# Patient Record
Sex: Male | Born: 1945 | Race: White | Hispanic: No | Marital: Single | Smoking: Former smoker
Health system: Southern US, Community
[De-identification: ages and names within clinical notes are randomized; demographics above are authoritative.]

## PROBLEM LIST (undated history)

## (undated) DIAGNOSIS — I10 Essential (primary) hypertension: Secondary | ICD-10-CM

## (undated) DIAGNOSIS — F32A Depression, unspecified: Secondary | ICD-10-CM

## (undated) DIAGNOSIS — M1990 Unspecified osteoarthritis, unspecified site: Secondary | ICD-10-CM

## (undated) DIAGNOSIS — R531 Weakness: Secondary | ICD-10-CM

## (undated) DIAGNOSIS — R109 Unspecified abdominal pain: Secondary | ICD-10-CM

## (undated) DIAGNOSIS — R0600 Dyspnea, unspecified: Secondary | ICD-10-CM

## (undated) DIAGNOSIS — E7800 Pure hypercholesterolemia, unspecified: Secondary | ICD-10-CM

## (undated) DIAGNOSIS — E119 Type 2 diabetes mellitus without complications: Secondary | ICD-10-CM

## (undated) DIAGNOSIS — N189 Chronic kidney disease, unspecified: Secondary | ICD-10-CM

## (undated) DIAGNOSIS — I252 Old myocardial infarction: Secondary | ICD-10-CM

## (undated) DIAGNOSIS — F329 Major depressive disorder, single episode, unspecified: Secondary | ICD-10-CM

## (undated) DIAGNOSIS — I255 Ischemic cardiomyopathy: Secondary | ICD-10-CM

## (undated) DIAGNOSIS — I2510 Atherosclerotic heart disease of native coronary artery without angina pectoris: Secondary | ICD-10-CM

## (undated) DIAGNOSIS — M79606 Pain in leg, unspecified: Secondary | ICD-10-CM

## (undated) HISTORY — DX: Pain in leg, unspecified: M79.606

## (undated) HISTORY — DX: Weakness: R53.1

## (undated) HISTORY — DX: Unspecified abdominal pain: R10.9

## (undated) HISTORY — DX: Ischemic cardiomyopathy: I25.5

## (undated) HISTORY — PX: SHOULDER SURGERY: SHX246

## (undated) HISTORY — DX: Old myocardial infarction: I25.2

---

## 1972-09-25 HISTORY — PX: COSMETIC SURGERY: SHX468

## 2006-11-08 ENCOUNTER — Other Ambulatory Visit: Payer: Self-pay

## 2006-11-08 ENCOUNTER — Inpatient Hospital Stay: Payer: Self-pay | Admitting: Internal Medicine

## 2006-11-12 ENCOUNTER — Ambulatory Visit: Payer: Self-pay | Admitting: Internal Medicine

## 2007-02-14 ENCOUNTER — Ambulatory Visit: Payer: Self-pay | Admitting: Gastroenterology

## 2013-03-17 DIAGNOSIS — C44311 Basal cell carcinoma of skin of nose: Secondary | ICD-10-CM | POA: Insufficient documentation

## 2014-01-15 ENCOUNTER — Ambulatory Visit: Payer: Self-pay | Admitting: Family Medicine

## 2014-06-02 ENCOUNTER — Emergency Department: Payer: Self-pay | Admitting: Emergency Medicine

## 2014-06-17 ENCOUNTER — Ambulatory Visit: Payer: Self-pay | Admitting: Internal Medicine

## 2014-09-14 ENCOUNTER — Ambulatory Visit: Payer: Self-pay | Admitting: Surgery

## 2014-09-14 DIAGNOSIS — M1712 Unilateral primary osteoarthritis, left knee: Secondary | ICD-10-CM | POA: Insufficient documentation

## 2014-09-14 LAB — SYNOVIAL CELL COUNT + DIFF, W/ CRYSTALS
Basophil: 0 %
CRYSTALS, JOINT FLUID: NONE SEEN
Eosinophil: 0 %
Lymphocytes: 28 %
Neutrophils: 72 %
Nucleated Cell Count: 3381 /mm3
OTHER MONONUCLEAR CELLS: 0 %
Other Cells BF: 0 %

## 2014-09-18 LAB — BODY FLUID CULTURE

## 2015-01-08 DIAGNOSIS — Z85828 Personal history of other malignant neoplasm of skin: Secondary | ICD-10-CM | POA: Diagnosis not present

## 2015-01-08 DIAGNOSIS — D0439 Carcinoma in situ of skin of other parts of face: Secondary | ICD-10-CM | POA: Diagnosis not present

## 2015-01-08 DIAGNOSIS — D485 Neoplasm of uncertain behavior of skin: Secondary | ICD-10-CM | POA: Diagnosis not present

## 2015-01-08 DIAGNOSIS — C44519 Basal cell carcinoma of skin of other part of trunk: Secondary | ICD-10-CM | POA: Diagnosis not present

## 2015-01-08 DIAGNOSIS — D045 Carcinoma in situ of skin of trunk: Secondary | ICD-10-CM | POA: Diagnosis not present

## 2015-02-11 DIAGNOSIS — C4441 Basal cell carcinoma of skin of scalp and neck: Secondary | ICD-10-CM | POA: Diagnosis not present

## 2015-02-11 DIAGNOSIS — L905 Scar conditions and fibrosis of skin: Secondary | ICD-10-CM | POA: Diagnosis not present

## 2015-02-25 DIAGNOSIS — D044 Carcinoma in situ of skin of scalp and neck: Secondary | ICD-10-CM | POA: Diagnosis not present

## 2015-06-08 DIAGNOSIS — E119 Type 2 diabetes mellitus without complications: Secondary | ICD-10-CM | POA: Diagnosis not present

## 2015-06-08 DIAGNOSIS — Z0000 Encounter for general adult medical examination without abnormal findings: Secondary | ICD-10-CM | POA: Diagnosis not present

## 2015-06-08 DIAGNOSIS — I10 Essential (primary) hypertension: Secondary | ICD-10-CM | POA: Diagnosis not present

## 2015-06-08 DIAGNOSIS — Z125 Encounter for screening for malignant neoplasm of prostate: Secondary | ICD-10-CM | POA: Diagnosis not present

## 2015-06-08 DIAGNOSIS — E785 Hyperlipidemia, unspecified: Secondary | ICD-10-CM | POA: Diagnosis not present

## 2015-06-16 DIAGNOSIS — M659 Synovitis and tenosynovitis, unspecified: Secondary | ICD-10-CM | POA: Diagnosis not present

## 2015-06-16 DIAGNOSIS — E782 Mixed hyperlipidemia: Secondary | ICD-10-CM | POA: Diagnosis not present

## 2015-06-16 DIAGNOSIS — E119 Type 2 diabetes mellitus without complications: Secondary | ICD-10-CM | POA: Diagnosis not present

## 2015-06-16 DIAGNOSIS — Z0000 Encounter for general adult medical examination without abnormal findings: Secondary | ICD-10-CM | POA: Diagnosis not present

## 2015-06-28 DIAGNOSIS — G8929 Other chronic pain: Secondary | ICD-10-CM | POA: Diagnosis not present

## 2015-06-28 DIAGNOSIS — M19012 Primary osteoarthritis, left shoulder: Secondary | ICD-10-CM | POA: Diagnosis not present

## 2015-06-28 DIAGNOSIS — M25512 Pain in left shoulder: Secondary | ICD-10-CM | POA: Diagnosis not present

## 2015-08-12 DIAGNOSIS — R600 Localized edema: Secondary | ICD-10-CM | POA: Diagnosis not present

## 2015-08-12 DIAGNOSIS — R079 Chest pain, unspecified: Secondary | ICD-10-CM | POA: Diagnosis not present

## 2016-04-10 DIAGNOSIS — H524 Presbyopia: Secondary | ICD-10-CM | POA: Diagnosis not present

## 2016-04-10 DIAGNOSIS — H5210 Myopia, unspecified eye: Secondary | ICD-10-CM | POA: Diagnosis not present

## 2016-04-12 DIAGNOSIS — E119 Type 2 diabetes mellitus without complications: Secondary | ICD-10-CM | POA: Diagnosis not present

## 2016-04-12 DIAGNOSIS — M7022 Olecranon bursitis, left elbow: Secondary | ICD-10-CM | POA: Diagnosis not present

## 2016-04-12 DIAGNOSIS — M19012 Primary osteoarthritis, left shoulder: Secondary | ICD-10-CM | POA: Diagnosis not present

## 2016-04-15 ENCOUNTER — Encounter (HOSPITAL_COMMUNITY)
Disposition: A | Discharge status: Home / Self Care | Payer: Self-pay | Source: Other Acute Inpatient Hospital | Attending: Cardiovascular Disease

## 2016-04-15 ENCOUNTER — Emergency Department: Payer: Commercial Managed Care - HMO

## 2016-04-15 ENCOUNTER — Emergency Department
Admission: EM | Admit: 2016-04-15 | Discharge: 2016-04-15 | Disposition: A | Discharge status: Other Acute Inpatient Hospital | Payer: Commercial Managed Care - HMO | Attending: Emergency Medicine | Admitting: Emergency Medicine

## 2016-04-15 ENCOUNTER — Encounter (HOSPITAL_COMMUNITY): Payer: Self-pay | Admitting: Cardiovascular Disease

## 2016-04-15 ENCOUNTER — Inpatient Hospital Stay (HOSPITAL_COMMUNITY)
Admit: 2016-04-15 | Discharge: 2016-04-19 | DRG: 247 | Disposition: A | Discharge status: Home / Self Care | Payer: Commercial Managed Care - HMO | Source: Other Acute Inpatient Hospital | Attending: Cardiovascular Disease | Admitting: Cardiovascular Disease

## 2016-04-15 ENCOUNTER — Encounter: Payer: Self-pay | Admitting: Emergency Medicine

## 2016-04-15 DIAGNOSIS — I2121 ST elevation (STEMI) myocardial infarction involving left circumflex coronary artery: Secondary | ICD-10-CM

## 2016-04-15 DIAGNOSIS — Y838 Other surgical procedures as the cause of abnormal reaction of the patient, or of later complication, without mention of misadventure at the time of the procedure: Secondary | ICD-10-CM | POA: Diagnosis not present

## 2016-04-15 DIAGNOSIS — M1990 Unspecified osteoarthritis, unspecified site: Secondary | ICD-10-CM | POA: Diagnosis not present

## 2016-04-15 DIAGNOSIS — I25110 Atherosclerotic heart disease of native coronary artery with unstable angina pectoris: Secondary | ICD-10-CM | POA: Diagnosis not present

## 2016-04-15 DIAGNOSIS — F329 Major depressive disorder, single episode, unspecified: Secondary | ICD-10-CM | POA: Diagnosis not present

## 2016-04-15 DIAGNOSIS — Z87891 Personal history of nicotine dependence: Secondary | ICD-10-CM

## 2016-04-15 DIAGNOSIS — Z888 Allergy status to other drugs, medicaments and biological substances status: Secondary | ICD-10-CM

## 2016-04-15 DIAGNOSIS — E119 Type 2 diabetes mellitus without complications: Secondary | ICD-10-CM | POA: Insufficient documentation

## 2016-04-15 DIAGNOSIS — R079 Chest pain, unspecified: Secondary | ICD-10-CM | POA: Diagnosis not present

## 2016-04-15 DIAGNOSIS — E7800 Pure hypercholesterolemia, unspecified: Secondary | ICD-10-CM | POA: Diagnosis present

## 2016-04-15 DIAGNOSIS — Z882 Allergy status to sulfonamides status: Secondary | ICD-10-CM

## 2016-04-15 DIAGNOSIS — S5011XA Contusion of right forearm, initial encounter: Secondary | ICD-10-CM | POA: Diagnosis not present

## 2016-04-15 DIAGNOSIS — I252 Old myocardial infarction: Secondary | ICD-10-CM

## 2016-04-15 DIAGNOSIS — I255 Ischemic cardiomyopathy: Secondary | ICD-10-CM | POA: Diagnosis present

## 2016-04-15 DIAGNOSIS — R0789 Other chest pain: Secondary | ICD-10-CM | POA: Diagnosis present

## 2016-04-15 DIAGNOSIS — I10 Essential (primary) hypertension: Secondary | ICD-10-CM | POA: Diagnosis not present

## 2016-04-15 DIAGNOSIS — I213 ST elevation (STEMI) myocardial infarction of unspecified site: Secondary | ICD-10-CM | POA: Diagnosis not present

## 2016-04-15 DIAGNOSIS — I2119 ST elevation (STEMI) myocardial infarction involving other coronary artery of inferior wall: Secondary | ICD-10-CM | POA: Diagnosis present

## 2016-04-15 DIAGNOSIS — N183 Chronic kidney disease, stage 3 unspecified: Secondary | ICD-10-CM

## 2016-04-15 DIAGNOSIS — E1122 Type 2 diabetes mellitus with diabetic chronic kidney disease: Secondary | ICD-10-CM | POA: Diagnosis present

## 2016-04-15 DIAGNOSIS — S40029A Contusion of unspecified upper arm, initial encounter: Secondary | ICD-10-CM

## 2016-04-15 DIAGNOSIS — I129 Hypertensive chronic kidney disease with stage 1 through stage 4 chronic kidney disease, or unspecified chronic kidney disease: Secondary | ICD-10-CM | POA: Diagnosis not present

## 2016-04-15 DIAGNOSIS — I2510 Atherosclerotic heart disease of native coronary artery without angina pectoris: Secondary | ICD-10-CM | POA: Diagnosis not present

## 2016-04-15 DIAGNOSIS — E876 Hypokalemia: Secondary | ICD-10-CM

## 2016-04-15 DIAGNOSIS — E785 Hyperlipidemia, unspecified: Secondary | ICD-10-CM | POA: Diagnosis present

## 2016-04-15 DIAGNOSIS — I200 Unstable angina: Secondary | ICD-10-CM

## 2016-04-15 DIAGNOSIS — Z955 Presence of coronary angioplasty implant and graft: Secondary | ICD-10-CM

## 2016-04-15 DIAGNOSIS — E1129 Type 2 diabetes mellitus with other diabetic kidney complication: Secondary | ICD-10-CM

## 2016-04-15 DIAGNOSIS — T79A0XA Compartment syndrome, unspecified, initial encounter: Secondary | ICD-10-CM

## 2016-04-15 HISTORY — DX: Major depressive disorder, single episode, unspecified: F32.9

## 2016-04-15 HISTORY — DX: Essential (primary) hypertension: I10

## 2016-04-15 HISTORY — DX: Pure hypercholesterolemia, unspecified: E78.00

## 2016-04-15 HISTORY — DX: Type 2 diabetes mellitus without complications: E11.9

## 2016-04-15 HISTORY — DX: Atherosclerotic heart disease of native coronary artery without angina pectoris: I25.10

## 2016-04-15 HISTORY — DX: Depression, unspecified: F32.A

## 2016-04-15 HISTORY — PX: CARDIAC CATHETERIZATION: SHX172

## 2016-04-15 HISTORY — DX: Unspecified osteoarthritis, unspecified site: M19.90

## 2016-04-15 HISTORY — DX: Old myocardial infarction: I25.2

## 2016-04-15 LAB — CBC
HCT: 39.9 % — ABNORMAL LOW (ref 40.0–52.0)
HEMATOCRIT: 37 % — AB (ref 39.0–52.0)
HEMOGLOBIN: 12.8 g/dL — AB (ref 13.0–17.0)
Hemoglobin: 14.1 g/dL (ref 13.0–18.0)
MCH: 29.4 pg (ref 26.0–34.0)
MCH: 28.8 pg (ref 26.0–34.0)
MCHC: 35.5 g/dL (ref 32.0–36.0)
MCHC: 34.6 g/dL (ref 30.0–36.0)
MCV: 82.8 fL (ref 80.0–100.0)
MCV: 83.1 fL (ref 78.0–100.0)
PLATELETS: 267 10*3/uL (ref 150–440)
Platelets: 245 10*3/uL (ref 150–400)
RBC: 4.82 MIL/uL (ref 4.40–5.90)
RBC: 4.45 MIL/uL (ref 4.22–5.81)
RDW: 14.5 % (ref 11.5–14.5)
RDW: 13.6 % (ref 11.5–15.5)
WBC: 10.3 10*3/uL (ref 3.8–10.6)
WBC: 11 10*3/uL — ABNORMAL HIGH (ref 4.0–10.5)

## 2016-04-15 LAB — COMPREHENSIVE METABOLIC PANEL
ALBUMIN: 4.4 g/dL (ref 3.5–5.0)
ALT: 17 U/L (ref 17–63)
AST: 18 U/L (ref 15–41)
Alkaline Phosphatase: 69 U/L (ref 38–126)
Anion gap: 11 (ref 5–15)
BILIRUBIN TOTAL: 1.3 mg/dL — AB (ref 0.3–1.2)
BUN: 14 mg/dL (ref 6–20)
CHLORIDE: 105 mmol/L (ref 101–111)
CO2: 24 mmol/L (ref 22–32)
CREATININE: 1.6 mg/dL — AB (ref 0.61–1.24)
Calcium: 9.5 mg/dL (ref 8.9–10.3)
GFR calc Af Amer: 49 mL/min — ABNORMAL LOW (ref >60)
GFR calc non Af Amer: 42 mL/min — ABNORMAL LOW (ref >60)
GLUCOSE: 149 mg/dL — AB (ref 65–99)
POTASSIUM: 2.9 mmol/L — AB (ref 3.5–5.1)
Sodium: 140 mmol/L (ref 135–145)
Total Protein: 7.7 g/dL (ref 6.5–8.1)

## 2016-04-15 LAB — DIFFERENTIAL
Basophils Absolute: 0.1 10*3/uL (ref 0–0.1)
Basophils Relative: 1 %
Eosinophils Absolute: 0.3 10*3/uL (ref 0–0.7)
Eosinophils Relative: 3 %
Lymphocytes Relative: 32 %
Lymphs Abs: 3.3 10*3/uL (ref 1.0–3.6)
Monocytes Absolute: 0.8 10*3/uL (ref 0.2–1.0)
Monocytes Relative: 8 %
Neutro Abs: 5.7 10*3/uL (ref 1.4–6.5)
Neutrophils Relative %: 56 %

## 2016-04-15 LAB — LIPID PANEL
CHOL/HDL RATIO: 5.4 ratio
Cholesterol: 222 mg/dL — ABNORMAL HIGH (ref 0–200)
HDL: 41 mg/dL (ref >40)
LDL CALC: 146 mg/dL — AB (ref 0–99)
Triglycerides: 177 mg/dL — ABNORMAL HIGH (ref <150)
VLDL: 35 mg/dL (ref 0–40)

## 2016-04-15 LAB — TROPONIN I
Troponin I: 0.03 ng/mL (ref <0.03)
Troponin I: 4.14 ng/mL (ref <0.03)

## 2016-04-15 LAB — PROTIME-INR
INR: 1.02
Prothrombin Time: 13.6 seconds (ref 11.4–15.0)

## 2016-04-15 LAB — MRSA PCR SCREENING: MRSA by PCR: NEGATIVE

## 2016-04-15 LAB — GLUCOSE, CAPILLARY: GLUCOSE-CAPILLARY: 119 mg/dL — AB (ref 65–99)

## 2016-04-15 LAB — CREATININE, SERUM
Creatinine, Ser: 1.59 mg/dL — ABNORMAL HIGH (ref 0.61–1.24)
GFR calc Af Amer: 49 mL/min — ABNORMAL LOW (ref >60)
GFR calc non Af Amer: 42 mL/min — ABNORMAL LOW (ref >60)

## 2016-04-15 LAB — TSH: TSH: 1.509 u[IU]/mL (ref 0.350–4.500)

## 2016-04-15 LAB — APTT: aPTT: 27 seconds (ref 24–36)

## 2016-04-15 LAB — BRAIN NATRIURETIC PEPTIDE: B NATRIURETIC PEPTIDE 5: 31.7 pg/mL (ref 0.0–100.0)

## 2016-04-15 SURGERY — LEFT HEART CATH AND CORONARY ANGIOGRAPHY
Anesthesia: LOCAL

## 2016-04-15 MED ORDER — FENTANYL CITRATE (PF) 100 MCG/2ML IJ SOLN
INTRAMUSCULAR | Status: DC | PRN
  Administered 2016-04-15 (×2): 50 ug via INTRAVENOUS
  Administered 2016-04-15: 25 ug via INTRAVENOUS

## 2016-04-15 MED ORDER — HEPARIN BOLUS VIA INFUSION
4000.0000 [IU] | Freq: Once | INTRAVENOUS | Status: DC
  Filled 2016-04-15: qty 4000

## 2016-04-15 MED ORDER — METOPROLOL TARTRATE 12.5 MG HALF TABLET
12.5000 mg | ORAL_TABLET | Freq: Two times a day (BID) | ORAL | Status: DC
  Administered 2016-04-15: 12.5 mg via ORAL
  Filled 2016-04-15 (×2): qty 1

## 2016-04-15 MED ORDER — NITROGLYCERIN 0.4 MG SL SUBL: 0.4000 mg | SUBLINGUAL_TABLET | SUBLINGUAL | Status: DC | PRN

## 2016-04-15 MED ORDER — VERAPAMIL HCL 2.5 MG/ML IV SOLN
INTRAVENOUS | Status: DC | PRN
  Administered 2016-04-15: 10 mL via INTRA_ARTERIAL

## 2016-04-15 MED ORDER — NITROGLYCERIN 0.4 MG SL SUBL
0.4000 mg | SUBLINGUAL_TABLET | SUBLINGUAL | Status: DC | PRN
  Administered 2016-04-15 (×2): 0.4 mg via SUBLINGUAL

## 2016-04-15 MED ORDER — MIDAZOLAM HCL 2 MG/2ML IJ SOLN
INTRAMUSCULAR | Status: DC | PRN
  Administered 2016-04-15 (×2): 2 mg via INTRAVENOUS

## 2016-04-15 MED ORDER — NITROGLYCERIN 1 MG/10 ML FOR IR/CATH LAB
INTRA_ARTERIAL | Status: DC | PRN
  Administered 2016-04-15: 200 ug

## 2016-04-15 MED ORDER — TIROFIBAN HCL IN NACL 5-0.9 MG/100ML-% IV SOLN
INTRAVENOUS | Status: DC | PRN
  Administered 2016-04-15 (×2): 0.075 ug/kg/min via INTRAVENOUS

## 2016-04-15 MED ORDER — SODIUM CHLORIDE 0.9% FLUSH
3.0000 mL | Freq: Two times a day (BID) | INTRAVENOUS | Status: DC
  Administered 2016-04-16: 3 mL via INTRAVENOUS

## 2016-04-15 MED ORDER — HEPARIN SODIUM (PORCINE) 5000 UNIT/ML IJ SOLN
4000.0000 [IU] | Freq: Once | INTRAMUSCULAR | Status: AC
  Administered 2016-04-15: 4000 [IU] via INTRAVENOUS

## 2016-04-15 MED ORDER — SODIUM CHLORIDE 0.9 % WEIGHT BASED INFUSION
3.0000 mL/kg/h | INTRAVENOUS | Status: AC
  Administered 2016-04-15: 3 mL/kg/h via INTRAVENOUS

## 2016-04-15 MED ORDER — HEPARIN (PORCINE) IN NACL 100-0.45 UNIT/ML-% IJ SOLN
1000.0000 [IU]/h | INTRAMUSCULAR | Status: DC
  Filled 2016-04-15: qty 250

## 2016-04-15 MED ORDER — ONDANSETRON HCL 4 MG/2ML IJ SOLN
4.0000 mg | Freq: Four times a day (QID) | INTRAMUSCULAR | Status: DC | PRN
Start: 2016-04-15 — End: 2016-04-19

## 2016-04-15 MED ORDER — INSULIN ASPART 100 UNIT/ML ~~LOC~~ SOLN
0.0000 [IU] | Freq: Three times a day (TID) | SUBCUTANEOUS | Status: DC
  Administered 2016-04-16: 2 [IU] via SUBCUTANEOUS
  Administered 2016-04-16: 5 [IU] via SUBCUTANEOUS
  Administered 2016-04-16: 3 [IU] via SUBCUTANEOUS
  Administered 2016-04-17 – 2016-04-19 (×3): 2 [IU] via SUBCUTANEOUS

## 2016-04-15 MED ORDER — TIROFIBAN HCL IN NACL 5-0.9 MG/100ML-% IV SOLN
INTRAVENOUS | Status: AC
  Filled 2016-04-15: qty 200

## 2016-04-15 MED ORDER — IOPAMIDOL (ISOVUE-370) INJECTION 76%
INTRAVENOUS | Status: DC | PRN
  Administered 2016-04-15: 100 mL via INTRA_ARTERIAL

## 2016-04-15 MED ORDER — VERAPAMIL HCL 2.5 MG/ML IV SOLN
INTRAVENOUS | Status: DC | PRN
  Administered 2016-04-15 (×2): 100 ug via INTRACORONARY
  Administered 2016-04-15: 200 ug via INTRACORONARY

## 2016-04-15 MED ORDER — POTASSIUM CHLORIDE CRYS ER 20 MEQ PO TBCR
40.0000 meq | EXTENDED_RELEASE_TABLET | Freq: Once | ORAL | Status: AC
  Administered 2016-04-15: 40 meq via ORAL
  Filled 2016-04-15: qty 2

## 2016-04-15 MED ORDER — HEPARIN SODIUM (PORCINE) 1000 UNIT/ML IJ SOLN
INTRAMUSCULAR | Status: DC | PRN
  Administered 2016-04-15: 4000 [IU] via INTRAVENOUS
  Administered 2016-04-15: 2000 [IU] via INTRAVENOUS
  Administered 2016-04-15: 4000 [IU] via INTRAVENOUS

## 2016-04-15 MED ORDER — ASPIRIN 81 MG PO CHEW
324.0000 mg | CHEWABLE_TABLET | Freq: Once | ORAL | Status: AC
  Administered 2016-04-15: 324 mg via ORAL

## 2016-04-15 MED ORDER — SODIUM CHLORIDE 0.9 % IV SOLN
INTRAVENOUS | Status: DC | PRN
  Administered 2016-04-15: 100 mL/h via INTRAVENOUS

## 2016-04-15 MED ORDER — OXYCODONE-ACETAMINOPHEN 5-325 MG PO TABS
1.0000 | ORAL_TABLET | ORAL | Status: DC | PRN
  Administered 2016-04-16: 1 via ORAL
  Filled 2016-04-15: qty 1

## 2016-04-15 MED ORDER — BIVALIRUDIN BOLUS VIA INFUSION - CUPID: INTRAVENOUS | Status: DC | PRN

## 2016-04-15 MED ORDER — TIROFIBAN (AGGRASTAT) BOLUS VIA INFUSION
INTRAVENOUS | Status: DC | PRN
  Administered 2016-04-15: 2087.5 ug via INTRAVENOUS

## 2016-04-15 MED ORDER — ATORVASTATIN CALCIUM 80 MG PO TABS
80.0000 mg | ORAL_TABLET | Freq: Every day | ORAL | Status: DC
  Administered 2016-04-15 – 2016-04-18 (×3): 80 mg via ORAL
  Filled 2016-04-15 (×3): qty 1

## 2016-04-15 MED ORDER — TIROFIBAN HCL IV 12.5 MG/250 ML
0.0750 ug/kg/min | INTRAVENOUS | Status: DC
  Filled 2016-04-15: qty 250

## 2016-04-15 MED ORDER — HEPARIN SODIUM (PORCINE) 5000 UNIT/ML IJ SOLN
5000.0000 [IU] | Freq: Three times a day (TID) | INTRAMUSCULAR | Status: DC
  Administered 2016-04-16 – 2016-04-17 (×4): 5000 [IU] via SUBCUTANEOUS
  Filled 2016-04-15 (×4): qty 1

## 2016-04-15 MED ORDER — HEPARIN (PORCINE) IN NACL 2-0.9 UNIT/ML-% IJ SOLN
INTRAMUSCULAR | Status: DC | PRN
  Administered 2016-04-15: 1000 mL

## 2016-04-15 MED ORDER — ACETAMINOPHEN 325 MG PO TABS: 650.0000 mg | ORAL_TABLET | ORAL | Status: DC | PRN

## 2016-04-15 MED ORDER — SODIUM CHLORIDE 0.9% FLUSH: 3.0000 mL | INTRAVENOUS | Status: DC | PRN

## 2016-04-15 MED ORDER — SODIUM CHLORIDE 0.9 % IV SOLN: 250.0000 mL | INTRAVENOUS | Status: DC | PRN

## 2016-04-15 MED ORDER — MORPHINE SULFATE (PF) 2 MG/ML IV SOLN: 2.0000 mg | INTRAVENOUS | Status: DC | PRN

## 2016-04-15 MED ORDER — ASPIRIN EC 81 MG PO TBEC
81.0000 mg | DELAYED_RELEASE_TABLET | Freq: Every day | ORAL | Status: DC
  Administered 2016-04-16 – 2016-04-19 (×4): 81 mg via ORAL
  Filled 2016-04-15 (×5): qty 1

## 2016-04-15 SURGICAL SUPPLY — 19 items
BALLN EMERGE MR 2.5X15 (BALLOONS) ×2
BALLOON EMERGE MR 2.5X15 (BALLOONS) ×1 IMPLANT
CATH INFINITI 5 FR JL3.5 (CATHETERS) ×2 IMPLANT
CATH INFINITI 5FR ANG PIGTAIL (CATHETERS) ×2 IMPLANT
CATH INFINITI JR4 5F (CATHETERS) ×2 IMPLANT
CATH VISTA GUIDE 6FR XB3.5 (CATHETERS) ×2 IMPLANT
CATH VISTA GUIDE 6FR XB4 (CATHETERS) ×2 IMPLANT
DEVICE RAD COMP TR BAND LRG (VASCULAR PRODUCTS) ×2 IMPLANT
ELECT DEFIB PAD ADLT CADENCE (PAD) ×2 IMPLANT
GLIDESHEATH SLEND SS 6F .021 (SHEATH) ×2 IMPLANT
KIT ENCORE 26 ADVANTAGE (KITS) ×2 IMPLANT
KIT HEART LEFT (KITS) ×2 IMPLANT
PACK CARDIAC CATHETERIZATION (CUSTOM PROCEDURE TRAY) ×2 IMPLANT
STENT SYNERGY DES 3.5X20 (Permanent Stent) ×2 IMPLANT
SYR MEDRAD MARK V 150ML (SYRINGE) ×2 IMPLANT
TRANSDUCER W/STOPCOCK (MISCELLANEOUS) ×2 IMPLANT
TUBING CIL FLEX 10 FLL-RA (TUBING) ×2 IMPLANT
WIRE COUGAR XT STRL 190CM (WIRE) ×2 IMPLANT
WIRE SAFE-T 1.5MM-J .035X260CM (WIRE) ×2 IMPLANT

## 2016-04-15 NOTE — ED Provider Notes (Signed)
[facility] Emergency Department Provider Note   ____________________________________________  Time seen: Approximately 5:40 PM I have reviewed the triage vital signs and the triage nursing note.  HISTORY  Chief Complaint Chest Pain   Historian Patient  HPI Gilbert Obrien is a 70 y.o. male with a history of hypertension, without known history of prior MI or coronary artery disease, presents with acute onset 45 minutes prior to arrival of central chest pain and pressure which is moderate to severe. Also associated with some shortness of breath and nausea. No vomiting. No fever. No recent illnesses. Nothing makes it worse or better.    Past Medical History  Diagnosis Date  . Hypertension   . Diabetes mellitus without complication ([place])   . Hypercholesteremia    Hypertension  There are no active problems to display for this patient.   Past Surgical History  Procedure Laterality Date  . Shoulder surgery      No current outpatient prescriptions on file.  Allergies Sulfa antibiotics  History reviewed. No pertinent family history.  Social History Social History  Substance Use Topics  . Smoking status: Former Research scientist (life sciences)  . Smokeless tobacco: None  . Alcohol Use: No    Review of Systems  Constitutional: Negative for fever. Eyes: Negative for visual changes. ENT: Negative for sore throat. Cardiovascular: Positive for chest pain. Respiratory: Positive for shortness of breath. Gastrointestinal: Negative for abdominal pain, vomiting and diarrhea. Genitourinary: Negative for dysuria. Musculoskeletal: Negative for back pain. Skin: Negative for rash. Neurological: Negative for headache. 10 point Review of Systems otherwise negative ____________________________________________   PHYSICAL EXAM:  VITAL SIGNS: ED Triage Vitals  Enc Vitals Group     BP --      Pulse --      Resp --      Temp --      Temp src --      SpO2 --      Weight --       Height --      Head Cir --      Peak Flow --      Pain Score --      Pain Loc --      Pain Edu? --      Excl. in [place]? --      Constitutional: Alert and oriented. Well appearing and in no distress. HEENT   Head: Normocephalic and atraumatic.      Eyes: Conjunctivae are normal. PERRL. Normal extraocular movements.      Ears:         Nose: No congestion/rhinnorhea.   Mouth/Throat: Mucous membranes are moist.   Neck: No stridor. Cardiovascular/Chest: Normal rate, regular rhythm.  No murmurs, rubs, or gallops. Respiratory: Normal respiratory effort without tachypnea nor retractions. Breath sounds are clear and equal bilaterally. No wheezes/rales/rhonchi. Gastrointestinal: Soft. No distention, no guarding, no rebound. Nontender.    Genitourinary/rectal:Deferred Musculoskeletal: Nontender with normal range of motion in all extremities. No joint effusions.  No lower extremity tenderness.  1+ lower extremity pitting edema bilateral lower extremities. Neurologic:  Normal speech and language. No gross or focal neurologic deficits are appreciated. Skin:  Skin is warm, dry and intact. No rash noted. Psychiatric: Mood and affect are normal. Speech and behavior are normal. Patient exhibits appropriate insight and judgment.  ____________________________________________   EKG I, Lisa Roca, MD, the attending physician have personally viewed and interpreted all ECGs.  78 bpm. Normal sinus rhythm. Narrow QRS. 2 small box ST segment elevation in II,  III, and F aVF. ST segment depression in V1 and V2. Some ST segment elevation additionally in V5 and V6 ____________________________________________  LABS (pertinent positives/negatives)  Labs Reviewed  CBC - Abnormal; Notable for the following:    HCT 39.9 (*)    All other components within normal limits  DIFFERENTIAL  PROTIME-INR  APTT  COMPREHENSIVE METABOLIC PANEL  TROPONIN I  LIPID PANEL  HEPARIN LEVEL (UNFRACTIONATED)   CBC    ____________________________________________  RADIOLOGY All Xrays were viewed by me.Interpreted by me   Chest x-ray: No mediastinal widening. No pulmonary infiltrates. __________________________________________  PROCEDURES  Procedure(s) performed: None  Critical Care performed: CRITICAL CARE Performed by: Lisa Roca   Total critical care time: 30 minutes  Critical care time was exclusive of separately billable procedures and treating other patients.  Critical care was necessary to treat or prevent imminent or life-threatening deterioration.  Critical care was time spent personally by me on the following activities: development of treatment plan with patient and/or surrogate as well as nursing, discussions with consultants, evaluation of patient's response to treatment, examination of patient, obtaining history from patient or surrogate, ordering and performing treatments and interventions, ordering and review of laboratory studies, ordering and review of radiographic studies, pulse oximetry and re-evaluation of patient's condition.   ____________________________________________   ED COURSE / ASSESSMENT AND PLAN  Pertinent labs & imaging results that were available during my care of the patient were reviewed by me and considered in my medical decision making (see chart for details).   This patient arrived with acute onset chest pain 45 minutes prior to arrival and in triage his EKG was brought to me and consistent with inferior/lateral STEMI.  Patient was stable vital signs and a clear chest x-ray. Patient was given 4 baby aspirin, and 4000 units heparin bolus in preparation for immediate transport. Patient was given 3 sequential nitroglycerin with significant improvement in pain from 8 down to less than 2.  I spoke with on-call STEMI cardiologist Dr. [name] at [facility] who accepted this patient in transfer. [facility] transport to the patient.  I updated  the patient and the spouse.    CONSULTATIONS:   Cardiology, [facility], accepts in transfer for STEMI.   Patient / Family / Caregiver informed of clinical course, medical decision-making process, and agree with plan.     ___________________________________________   FINAL CLINICAL IMPRESSION(S) / ED DIAGNOSES   Final diagnoses:  ST elevation myocardial infarction (STEMI), unspecified artery ([place])              Note: This dictation was prepared with Dragon dictation. Any transcriptional errors that result from this process are unintentional   Lisa Roca, MD 04/15/16 1806

## 2016-04-15 NOTE — ED Notes (Signed)
Report to aaron RN in cath lab

## 2016-04-15 NOTE — Progress Notes (Signed)
Notified about arm hematoma s/p radial cath/PCI. D/W Dr Rosanna Randy. Pt examined - in no distress, but right forearm has tense hematoma. TR band in appropriate position over the radial access site. Plethysmography signal from right thumb is brisk. Arm wrapped in ACE-bandage to compress hematoma. Will reassess to determine whether he can remain on tirofiban which is necessary to prevent stent thrombosis after PCI earlier tonight.  Sherren Mocha 04/15/2016 10:49 PM

## 2016-04-15 NOTE — ED Notes (Signed)
Report to [facility] at bedside. [facility].

## 2016-04-15 NOTE — ED Notes (Signed)
C/O mid chest pain x 45 minutes.  C/O feeling weak and nauseated with pain.  Denies any history of similar symptoms.

## 2016-04-15 NOTE — ED Notes (Signed)
defib pads placed on pt 
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
## 2016-04-15 NOTE — Progress Notes (Signed)
ANTICOAGULATION CONSULT NOTE - Initial Consult  Pharmacy Consult for Heparin Infusion Indication: ACS/STEMI  Allergies  Allergen Reactions  . Sulfa Antibiotics Nausea And Vomiting    Patient Measurements: Height: 5\' 7"  (170.2 cm) Weight: 184 lb (83.462 kg) IBW/kg (Calculated) : 66.1 Heparin Dosing Weight: 82.9 kg  Vital Signs: Temp: 98.1 F (36.7 C) (07/22 1745) Temp Source: Oral (07/22 1745) BP: 153/73 mmHg (07/22 1745) Pulse Rate: 84 (07/22 1749)  Labs: No results for input(s): HGB, HCT, PLT, APTT, LABPROT, INR, HEPARINUNFRC, HEPRLOWMOCWT, CREATININE, CKTOTAL, CKMB, TROPONINI in the last 72 hours.  CrCl cannot be calculated (Patient has no serum creatinine result on file.).   Medical History: Past Medical History  Diagnosis Date  . Hypertension   . Diabetes mellitus without complication ([place])   . Hypercholesteremia     Medications:  Home med list has not been completed at this time. Reviewed med list from PCP office, no anticoagulants are listed.  Assessment: Patient to be initiated on Heparin infusion for ACS/STEMI.  Goal of Therapy:  Heparin level 0.3-0.7 units/ml Monitor platelets by anticoagulation protocol: Yes   Plan:  Give 4000 units bolus x 1 Start heparin infusion at 1000 units/hr Check anti-Xa level in 6 hours and daily while on heparin Continue to monitor H&H and platelets  Paulina Fusi, PharmD, BCPS 04/15/2016 5:55 PM

## 2016-04-15 NOTE — ED Notes (Signed)
Pt reports onset chest pain 25 min ago. Has had SHOB and diaphoresis

## 2016-04-15 NOTE — Progress Notes (Signed)
   04/15/16 2000  Clinical Encounter Type  Visited With Patient not available  Visit Type ED  Referral From Nurse  Consult/Referral To Chaplain   Chaplain responded to ED for page.  Code Stemi, patient came in and was transported straight to Cath Lab. No family accompanied patient.  Vilinda Blanks [facility] 04/15/2016 8:40 PM

## 2016-04-16 ENCOUNTER — Inpatient Hospital Stay (HOSPITAL_COMMUNITY): Payer: Commercial Managed Care - HMO

## 2016-04-16 ENCOUNTER — Encounter (HOSPITAL_COMMUNITY): Payer: Self-pay

## 2016-04-16 ENCOUNTER — Inpatient Hospital Stay (HOSPITAL_COMMUNITY): Payer: Self-pay

## 2016-04-16 DIAGNOSIS — T79A0XA Compartment syndrome, unspecified, initial encounter: Secondary | ICD-10-CM

## 2016-04-16 LAB — BASIC METABOLIC PANEL WITH GFR
Anion gap: 5 (ref 5–15)
BUN: 9 mg/dL (ref 6–20)
CO2: 25 mmol/L (ref 22–32)
Calcium: 9 mg/dL (ref 8.9–10.3)
Chloride: 108 mmol/L (ref 101–111)
Creatinine, Ser: 1.44 mg/dL — ABNORMAL HIGH (ref 0.61–1.24)
GFR calc Af Amer: 55 mL/min — ABNORMAL LOW
GFR calc non Af Amer: 48 mL/min — ABNORMAL LOW
Glucose, Bld: 150 mg/dL — ABNORMAL HIGH (ref 65–99)
Potassium: 3.8 mmol/L (ref 3.5–5.1)
Sodium: 138 mmol/L (ref 135–145)

## 2016-04-16 LAB — CBC
HCT: 35 % — ABNORMAL LOW (ref 39.0–52.0)
Hemoglobin: 12.1 g/dL — ABNORMAL LOW (ref 13.0–17.0)
MCH: 28.8 pg (ref 26.0–34.0)
MCHC: 34.6 g/dL (ref 30.0–36.0)
MCV: 83.3 fL (ref 78.0–100.0)
PLATELETS: 221 10*3/uL (ref 150–400)
RBC: 4.2 MIL/uL — AB (ref 4.22–5.81)
RDW: 13.6 % (ref 11.5–15.5)
WBC: 10.2 10*3/uL (ref 4.0–10.5)

## 2016-04-16 LAB — TROPONIN I
Troponin I: 22.07 ng/mL
Troponin I: 17.24 ng/mL

## 2016-04-16 LAB — GLUCOSE, CAPILLARY
GLUCOSE-CAPILLARY: 165 mg/dL — AB (ref 65–99)
GLUCOSE-CAPILLARY: 249 mg/dL — AB (ref 65–99)
Glucose-Capillary: 150 mg/dL — ABNORMAL HIGH (ref 65–99)
Glucose-Capillary: 117 mg/dL — ABNORMAL HIGH (ref 65–99)

## 2016-04-16 LAB — LIPID PANEL
Cholesterol: 175 mg/dL (ref 0–200)
HDL: 32 mg/dL — ABNORMAL LOW
LDL Cholesterol: 101 mg/dL — ABNORMAL HIGH (ref 0–99)
Total CHOL/HDL Ratio: 5.5 ratio
Triglycerides: 209 mg/dL — ABNORMAL HIGH
VLDL: 42 mg/dL — ABNORMAL HIGH (ref 0–40)

## 2016-04-16 MED ORDER — TICAGRELOR 90 MG PO TABS
90.0000 mg | ORAL_TABLET | Freq: Two times a day (BID) | ORAL | Status: DC
  Administered 2016-04-16 – 2016-04-19 (×6): 90 mg via ORAL
  Filled 2016-04-16 (×7): qty 1

## 2016-04-16 MED ORDER — TICAGRELOR 90 MG PO TABS
180.0000 mg | ORAL_TABLET | Freq: Once | ORAL | Status: AC
  Administered 2016-04-16: 180 mg via ORAL
  Filled 2016-04-16: qty 2

## 2016-04-16 MED ORDER — METOPROLOL TARTRATE 25 MG PO TABS
25.0000 mg | ORAL_TABLET | Freq: Two times a day (BID) | ORAL | Status: DC
  Administered 2016-04-16 – 2016-04-19 (×6): 25 mg via ORAL
  Filled 2016-04-16 (×6): qty 1

## 2016-04-16 MED ORDER — SODIUM CHLORIDE 0.9 % WEIGHT BASED INFUSION: 1.0000 mL/kg/h | INTRAVENOUS | Status: DC

## 2016-04-16 MED ORDER — SODIUM CHLORIDE 0.9% FLUSH: 3.0000 mL | INTRAVENOUS | Status: DC | PRN

## 2016-04-16 MED ORDER — SODIUM CHLORIDE 0.9 % IV SOLN: 250.0000 mL | INTRAVENOUS | Status: DC | PRN

## 2016-04-16 MED ORDER — SODIUM CHLORIDE 0.9% FLUSH
3.0000 mL | Freq: Two times a day (BID) | INTRAVENOUS | Status: DC
  Administered 2016-04-16 – 2016-04-17 (×2): 3 mL via INTRAVENOUS

## 2016-04-16 MED ORDER — SODIUM CHLORIDE 0.9 % WEIGHT BASED INFUSION
3.0000 mL/kg/h | INTRAVENOUS | Status: DC
  Administered 2016-04-17: 3 mL/kg/h via INTRAVENOUS

## 2016-04-16 NOTE — Progress Notes (Signed)
Utilization review completed.  

## 2016-04-16 NOTE — Progress Notes (Signed)
    Subjective:  The patient denies chest pain. His right arm is feeling better. No shortness of breath or other complaints.  Objective:  Vital Signs in the last 24 hours: Temp:  [97.7 F (36.5 C)-98.4 F (36.9 C)] 98.4 F (36.9 C) (07/23 0800) Pulse Rate:  [46-84] 56 (07/23 0600) Resp:  [11-32] 15 (07/23 0600) BP: (111-153)/(69-86) 131/75 (07/23 0600) SpO2:  [93 %-100 %] 97 % (07/23 0600) Weight:  [80.5 kg (177 lb 7.5 oz)-83.5 kg (184 lb)] 80.5 kg (177 lb 7.5 oz) (07/22 2028)  Intake/Output from previous day: 07/22 0701 - 07/23 0700 In: 1189.9 [P.O.:240; I.V.:949.9] Out: 66 [Urine:975]  Physical Exam: Pt is alert and oriented, NAD HEENT: normal Neck: JVP - normal Lungs: CTA bilaterally CV: RRR without murmur or gallop Abd: soft, NT, Positive BS, no hepatomegaly Ext: There is a right forearm hematoma. Radial pulses 2+. The arm is less tense compared to l;ast night's exam, distal pulses intact and equal Skin: warm/dry no rash   Lab Results:  Recent Labs  04/15/16 2127 04/16/16 0238  WBC 11.0* 10.2  HGB 12.8* 12.1*  PLT 245 221    Recent Labs  04/15/16 1744 04/15/16 2127 04/16/16 0820  NA 140  --  138  K 2.9*  --  3.8  CL 105  --  108  CO2 24  --  25  GLUCOSE 149*  --  150*  BUN 14  --  9  CREATININE 1.60* 1.59* 1.44*    Recent Labs  04/16/16 0238 04/16/16 0820  TROPONINI 22.07* 17.24*    Tele: Personally reviewed: Sinus rhythm  Assessment/Plan:  1. Inferolateral STEMI with severe multivessel CAD: The patient's troponin has peaked at 22. He underwent primary PCI of the left circumflex and has severe residual disease in the proximal LAD and proximal RCA. I have reviewed his cardiac catheterization films and discussed his case with Dr. Cyndia Bent regarding best revascularization option. After discussion of pros and cons of multivessel PCI versus CABG with the patient, recommend proceeding with PCI tomorrow. Will load him with brilinta 180 mg this morning,  discontinue Aggrastat in 2 hours, and plan on PCI of the LAD and right coronary artery tomorrow. I have reviewed the risks, indications, and alternatives to cardiac catheterization, angioplasty, and stenting with the patient. Risks include but are not limited to bleeding, infection, vascular injury, stroke, myocardial infection, arrhythmia, kidney injury, radiation-related injury in the case of prolonged fluoroscopy use, emergency cardiac surgery, and death. The patient understands the risks of serious complication is 1-2 in 123XX123 with diagnostic cardiac cath and 1-2% or less with angioplasty/stenting. Likely will do procedure from left radial or groin approach since he has a right forearm hematoma. All questions answered. Continue beta-blocker, high-intensity statin. No ACE-I until after PCI to reduce risk of contrast nephropathy/AKI.  2. Right forearm hematoma: Likely exacerbated by use of Aggrastat. Improved after compression of the right forearm and stable by exam this morning. Distal neurovascular is intact. Hemoglobin/hematocrit is stable.  3. Type 2 diabetes: Hemoglobin A1c is pending. Sliding scale insulin while hospitalized.  4. Hyperlipidemia: LDL cholesterol 101 mg/dL. High intensity statin initiated.  5. Hypertension: Blood pressure controlled. Continue beta blocker.  Disposition: Plans for PCI tomorrow. Orders written. Anticipate discharge home Tuesday as long as no complications arise.  Sherren Mocha, M.D. 04/16/2016, 10:25 AM

## 2016-04-16 NOTE — Progress Notes (Signed)
Dr. [name] returned to view arm together. Arm softer after ACE wrap application. TR band deflation still in progress. Will re apply ACE wrap in 5 minutes for another hour and then remove for the night. Patient radial pulse now +1, 02 sat in right thumb 98%. Will continue to monitor closely.

## 2016-04-16 NOTE — Progress Notes (Signed)
*  PRELIMINARY RESULTS* Vascular Ultrasound Right Upper Extremity Arterial Duplex has been completed.   The right subclavian, axillary, brachial, radial, ulnar, and palmar arch arteries were visualized and found to be patent with triphasic flow.   04/16/2016 9:34 AM Maudry Mayhew, B.S., RVT, RDCS, RDMS

## 2016-04-17 ENCOUNTER — Inpatient Hospital Stay (HOSPITAL_COMMUNITY): Payer: Commercial Managed Care - HMO

## 2016-04-17 ENCOUNTER — Encounter (HOSPITAL_COMMUNITY)
Disposition: A | Discharge status: Home / Self Care | Payer: Self-pay | Source: Other Acute Inpatient Hospital | Attending: Cardiovascular Disease

## 2016-04-17 ENCOUNTER — Encounter (HOSPITAL_COMMUNITY): Payer: Self-pay | Admitting: Cardiovascular Disease

## 2016-04-17 DIAGNOSIS — R079 Chest pain, unspecified: Secondary | ICD-10-CM

## 2016-04-17 DIAGNOSIS — I200 Unstable angina: Secondary | ICD-10-CM

## 2016-04-17 DIAGNOSIS — I25110 Atherosclerotic heart disease of native coronary artery with unstable angina pectoris: Secondary | ICD-10-CM

## 2016-04-17 HISTORY — PX: CORONARY STENT PLACEMENT: SHX1402

## 2016-04-17 HISTORY — PX: CARDIAC CATHETERIZATION: SHX172

## 2016-04-17 LAB — ECHOCARDIOGRAM COMPLETE
CHL CUP MV DEC (S): 313
E decel time: 313 msec
E/e' ratio: 16.68
FS: 15 % — AB (ref 28–44)
HEIGHTINCHES: 67 in
IV/PV OW: 0.81
LA diam index: 1.72 cm/m2
LA vol A4C: 28.5 ml
LASIZE: 33 mm
LAVOL: 34.3 mL
LAVOLIN: 17.9 mL/m2
LEFT ATRIUM END SYS DIAM: 33 mm
LV E/e'average: 16.68
LV PW d: 12.2 mm — AB (ref 0.6–1.1)
LVEEMED: 16.68
LVELAT: 4.13 cm/s
MVPKAVEL: 118 m/s
MVPKEVEL: 68.9 m/s
RV TAPSE: 22.8 mm
TDI e' lateral: 4.13
TDI e' medial: 4.9
WEIGHTICAEL: 2839.52 [oz_av]

## 2016-04-17 LAB — HEMOGLOBIN A1C
Hgb A1c MFr Bld: 7.1 % — ABNORMAL HIGH (ref 4.8–5.6)
MEAN PLASMA GLUCOSE: 157 mg/dL

## 2016-04-17 LAB — CBC
HEMATOCRIT: 36.2 % — AB (ref 39.0–52.0)
HEMOGLOBIN: 12.7 g/dL — AB (ref 13.0–17.0)
MCH: 28.9 pg (ref 26.0–34.0)
MCHC: 35.1 g/dL (ref 30.0–36.0)
MCV: 82.3 fL (ref 78.0–100.0)
PLATELETS: 206 10*3/uL (ref 150–400)
RBC: 4.4 MIL/uL (ref 4.22–5.81)
RDW: 13.6 % (ref 11.5–15.5)
WBC: 9.6 10*3/uL (ref 4.0–10.5)

## 2016-04-17 LAB — GLUCOSE, CAPILLARY
GLUCOSE-CAPILLARY: 138 mg/dL — AB (ref 65–99)
GLUCOSE-CAPILLARY: 121 mg/dL — AB (ref 65–99)
GLUCOSE-CAPILLARY: 98 mg/dL (ref 65–99)
GLUCOSE-CAPILLARY: 130 mg/dL — AB (ref 65–99)

## 2016-04-17 LAB — BASIC METABOLIC PANEL
Anion gap: 6 (ref 5–15)
BUN: 12 mg/dL (ref 6–20)
CALCIUM: 8.8 mg/dL — AB (ref 8.9–10.3)
CO2: 24 mmol/L (ref 22–32)
Chloride: 110 mmol/L (ref 101–111)
Creatinine, Ser: 1.39 mg/dL — ABNORMAL HIGH (ref 0.61–1.24)
GFR calc Af Amer: 58 mL/min — ABNORMAL LOW (ref >60)
GFR calc non Af Amer: 50 mL/min — ABNORMAL LOW (ref >60)
GLUCOSE: 149 mg/dL — AB (ref 65–99)
Potassium: 3.3 mmol/L — ABNORMAL LOW (ref 3.5–5.1)
Sodium: 140 mmol/L (ref 135–145)

## 2016-04-17 LAB — POCT ACTIVATED CLOTTING TIME
ACTIVATED CLOTTING TIME: 224 s
ACTIVATED CLOTTING TIME: 373 s
Activated Clotting Time: 252 seconds
Activated Clotting Time: 307 seconds

## 2016-04-17 SURGERY — CORONARY STENT INTERVENTION
Anesthesia: LOCAL

## 2016-04-17 MED ORDER — SODIUM CHLORIDE 0.9% FLUSH: 3.0000 mL | INTRAVENOUS | Status: DC | PRN

## 2016-04-17 MED ORDER — SODIUM CHLORIDE 0.9% FLUSH
3.0000 mL | Freq: Two times a day (BID) | INTRAVENOUS | Status: DC
Start: 2016-04-17 — End: 2016-04-18
  Administered 2016-04-17 – 2016-04-18 (×2): 3 mL via INTRAVENOUS

## 2016-04-17 MED ORDER — FENTANYL CITRATE (PF) 100 MCG/2ML IJ SOLN
INTRAMUSCULAR | Status: AC
  Filled 2016-04-17: qty 2

## 2016-04-17 MED ORDER — IOPAMIDOL (ISOVUE-370) INJECTION 76%
INTRAVENOUS | Status: DC | PRN
  Administered 2016-04-17: 90 mL via INTRA_ARTERIAL

## 2016-04-17 MED ORDER — HEART ATTACK BOUNCING BOOK
Freq: Once | Status: AC
  Administered 2016-04-17: 21:00:00
  Filled 2016-04-17: qty 1

## 2016-04-17 MED ORDER — SODIUM CHLORIDE 0.9 % WEIGHT BASED INFUSION: 1.0000 mL/kg/h | INTRAVENOUS | Status: DC

## 2016-04-17 MED ORDER — FENTANYL CITRATE (PF) 100 MCG/2ML IJ SOLN
INTRAMUSCULAR | Status: DC | PRN
  Administered 2016-04-17: 50 ug via INTRAVENOUS

## 2016-04-17 MED ORDER — HEPARIN SODIUM (PORCINE) 1000 UNIT/ML IJ SOLN
INTRAMUSCULAR | Status: DC | PRN
  Administered 2016-04-17: 10000 [IU] via INTRAVENOUS

## 2016-04-17 MED ORDER — IOPAMIDOL (ISOVUE-370) INJECTION 76%
INTRAVENOUS | Status: AC
  Filled 2016-04-17: qty 100

## 2016-04-17 MED ORDER — ANGIOPLASTY BOOK
Freq: Once | Status: AC
  Administered 2016-04-17: 21:00:00
  Filled 2016-04-17: qty 1

## 2016-04-17 MED ORDER — HEPARIN SODIUM (PORCINE) 1000 UNIT/ML IJ SOLN
INTRAMUSCULAR | Status: AC
  Filled 2016-04-17: qty 1

## 2016-04-17 MED ORDER — IOPAMIDOL (ISOVUE-370) INJECTION 76%
INTRAVENOUS | Status: AC
  Filled 2016-04-17: qty 125

## 2016-04-17 MED ORDER — SODIUM CHLORIDE 0.9 % IV SOLN
INTRAVENOUS | Status: AC
  Administered 2016-04-17 (×2): via INTRAVENOUS

## 2016-04-17 MED ORDER — HYDRALAZINE HCL 20 MG/ML IJ SOLN: 10.0000 mg | Freq: Four times a day (QID) | INTRAMUSCULAR | Status: DC | PRN

## 2016-04-17 MED ORDER — SODIUM CHLORIDE 0.9 % IV SOLN: 250.0000 mL | INTRAVENOUS | Status: DC | PRN

## 2016-04-17 MED ORDER — HEPARIN (PORCINE) IN NACL 2-0.9 UNIT/ML-% IJ SOLN
INTRAMUSCULAR | Status: AC
  Filled 2016-04-17: qty 1000

## 2016-04-17 MED ORDER — MIDAZOLAM HCL 2 MG/2ML IJ SOLN
INTRAMUSCULAR | Status: DC | PRN
  Administered 2016-04-17: 2 mg via INTRAVENOUS

## 2016-04-17 MED ORDER — LIDOCAINE HCL (PF) 1 % IJ SOLN
INTRAMUSCULAR | Status: AC
  Filled 2016-04-17: qty 30

## 2016-04-17 MED ORDER — VERAPAMIL HCL 2.5 MG/ML IV SOLN
INTRAVENOUS | Status: AC
  Filled 2016-04-17: qty 2

## 2016-04-17 MED ORDER — POTASSIUM CHLORIDE CRYS ER 20 MEQ PO TBCR
20.0000 meq | EXTENDED_RELEASE_TABLET | Freq: Once | ORAL | Status: AC
  Administered 2016-04-17: 20 meq via ORAL
  Filled 2016-04-17: qty 1

## 2016-04-17 MED ORDER — HEPARIN (PORCINE) IN NACL 2-0.9 UNIT/ML-% IJ SOLN
INTRAMUSCULAR | Status: DC | PRN
  Administered 2016-04-17: 1500 mL

## 2016-04-17 MED ORDER — VERAPAMIL HCL 2.5 MG/ML IV SOLN
INTRAVENOUS | Status: DC | PRN
  Administered 2016-04-17: 10 mL via INTRA_ARTERIAL

## 2016-04-17 MED ORDER — HEPARIN (PORCINE) IN NACL 2-0.9 UNIT/ML-% IJ SOLN
INTRAMUSCULAR | Status: AC
  Filled 2016-04-17: qty 500

## 2016-04-17 MED ORDER — ZOLPIDEM TARTRATE 5 MG PO TABS
5.0000 mg | ORAL_TABLET | Freq: Every evening | ORAL | Status: DC | PRN
  Administered 2016-04-17: 5 mg via ORAL
  Filled 2016-04-17: qty 1

## 2016-04-17 MED ORDER — SODIUM CHLORIDE 0.9 % WEIGHT BASED INFUSION
3.0000 mL/kg/h | INTRAVENOUS | Status: DC
  Administered 2016-04-18: 3 mL/kg/h via INTRAVENOUS

## 2016-04-17 MED ORDER — NITROGLYCERIN 1 MG/10 ML FOR IR/CATH LAB
INTRA_ARTERIAL | Status: AC
  Filled 2016-04-17: qty 10

## 2016-04-17 MED ORDER — MIDAZOLAM HCL 2 MG/2ML IJ SOLN
INTRAMUSCULAR | Status: AC
  Filled 2016-04-17: qty 2

## 2016-04-17 MED ORDER — SODIUM CHLORIDE 0.9% FLUSH: 3.0000 mL | Freq: Two times a day (BID) | INTRAVENOUS | Status: DC

## 2016-04-17 MED ORDER — LIDOCAINE HCL (PF) 1 % IJ SOLN
INTRAMUSCULAR | Status: DC | PRN
  Administered 2016-04-17: 3 mL via INTRADERMAL

## 2016-04-17 SURGICAL SUPPLY — 18 items
BALLN EMERGE MR 2.25X15 (BALLOONS) ×2
BALLN ~~LOC~~ EMERGE MR 3.5X15 (BALLOONS) ×2
BALLOON EMERGE MR 2.25X15 (BALLOONS) ×1 IMPLANT
BALLOON ~~LOC~~ EMERGE MR 3.5X15 (BALLOONS) ×1 IMPLANT
CATH VISTA GUIDE 6FR AL1 (CATHETERS) ×2 IMPLANT
CATH VISTA GUIDE 6FR XBLAD3.5 (CATHETERS) ×2 IMPLANT
DEVICE RAD COMP TR BAND LRG (VASCULAR PRODUCTS) ×2 IMPLANT
GLIDESHEATH SLEND SS 6F .021 (SHEATH) ×2 IMPLANT
GUIDELINER 6F (CATHETERS) ×2 IMPLANT
KIT ENCORE 26 ADVANTAGE (KITS) ×2 IMPLANT
KIT HEART LEFT (KITS) ×2 IMPLANT
PACK CARDIAC CATHETERIZATION (CUSTOM PROCEDURE TRAY) ×2 IMPLANT
STENT SYNERGY DES 3.5X38 (Permanent Stent) ×2 IMPLANT
TRANSDUCER W/STOPCOCK (MISCELLANEOUS) ×2 IMPLANT
TUBING CIL FLEX 10 FLL-RA (TUBING) ×2 IMPLANT
WIRE COUGAR XT STRL 190CM (WIRE) ×2 IMPLANT
WIRE HI TORQ BMW 190CM (WIRE) ×2 IMPLANT
WIRE SAFE-T 1.5MM-J .035X260CM (WIRE) ×2 IMPLANT

## 2016-04-17 NOTE — H&P (View-Only) (Signed)
    Subjective:  The patient denies chest pain. His right arm is feeling better. No shortness of breath or other complaints.  Objective:  Vital Signs in the last 24 hours: Temp:  [97.7 F (36.5 C)-98.4 F (36.9 C)] 98.4 F (36.9 C) (07/23 0800) Pulse Rate:  [46-84] 56 (07/23 0600) Resp:  [11-32] 15 (07/23 0600) BP: (111-153)/(69-86) 131/75 (07/23 0600) SpO2:  [93 %-100 %] 97 % (07/23 0600) Weight:  [80.5 kg (177 lb 7.5 oz)-83.5 kg (184 lb)] 80.5 kg (177 lb 7.5 oz) (07/22 2028)  Intake/Output from previous day: 07/22 0701 - 07/23 0700 In: 1189.9 [P.O.:240; I.V.:949.9] Out: 89 [Urine:975]  Physical Exam: Pt is alert and oriented, NAD HEENT: normal Neck: JVP - normal Lungs: CTA bilaterally CV: RRR without murmur or gallop Abd: soft, NT, Positive BS, no hepatomegaly Ext: There is a right forearm hematoma. Radial pulses 2+. The arm is less tense compared to l;ast night's exam, distal pulses intact and equal Skin: warm/dry no rash   Lab Results:  Recent Labs  04/15/16 2127 04/16/16 0238  WBC 11.0* 10.2  HGB 12.8* 12.1*  PLT 245 221    Recent Labs  04/15/16 1744 04/15/16 2127 04/16/16 0820  NA 140  --  138  K 2.9*  --  3.8  CL 105  --  108  CO2 24  --  25  GLUCOSE 149*  --  150*  BUN 14  --  9  CREATININE 1.60* 1.59* 1.44*    Recent Labs  04/16/16 0238 04/16/16 0820  TROPONINI 22.07* 17.24*    Tele: Personally reviewed: Sinus rhythm  Assessment/Plan:  1. Inferolateral STEMI with severe multivessel CAD: The patient's troponin has peaked at 22. He underwent primary PCI of the left circumflex and has severe residual disease in the proximal LAD and proximal RCA. I have reviewed his cardiac catheterization films and discussed his case with Dr. Cyndia Bent regarding best revascularization option. After discussion of pros and cons of multivessel PCI versus CABG with the patient, recommend proceeding with PCI tomorrow. Will load him with brilinta 180 mg this morning,  discontinue Aggrastat in 2 hours, and plan on PCI of the LAD and right coronary artery tomorrow. I have reviewed the risks, indications, and alternatives to cardiac catheterization, angioplasty, and stenting with the patient. Risks include but are not limited to bleeding, infection, vascular injury, stroke, myocardial infection, arrhythmia, kidney injury, radiation-related injury in the case of prolonged fluoroscopy use, emergency cardiac surgery, and death. The patient understands the risks of serious complication is 1-2 in 123XX123 with diagnostic cardiac cath and 1-2% or less with angioplasty/stenting. Likely will do procedure from left radial or groin approach since he has a right forearm hematoma. All questions answered. Continue beta-blocker, high-intensity statin. No ACE-I until after PCI to reduce risk of contrast nephropathy/AKI.  2. Right forearm hematoma: Likely exacerbated by use of Aggrastat. Improved after compression of the right forearm and stable by exam this morning. Distal neurovascular is intact. Hemoglobin/hematocrit is stable.  3. Type 2 diabetes: Hemoglobin A1c is pending. Sliding scale insulin while hospitalized.  4. Hyperlipidemia: LDL cholesterol 101 mg/dL. High intensity statin initiated.  5. Hypertension: Blood pressure controlled. Continue beta blocker.  Disposition: Plans for PCI tomorrow. Orders written. Anticipate discharge home Tuesday as long as no complications arise.  Sherren Mocha, M.D. 04/16/2016, 10:25 AM

## 2016-04-17 NOTE — Progress Notes (Signed)
   The patient is for PCI today.  Daryel November, MD

## 2016-04-17 NOTE — Progress Notes (Signed)
*  PRELIMINARY RESULTS* Echocardiogram 2D Echocardiogram has been performed.  Gilbert Obrien 04/17/2016, 12:24 PM

## 2016-04-17 NOTE — Interval H&P Note (Signed)
History and Physical Interval Note:  04/17/2016 1:29 PM  Gilbert Obrien  has presented today for cardiac cath/PCI with the diagnosis of CAD, unstable angina, staged PCI of the LAD/RCA.  The various methods of treatment have been discussed with the patient and family. After consideration of risks, benefits and other options for treatment, the patient has consented to  Procedure(s): Coronary Stent Intervention (N/A) as a surgical intervention .  The patient's history has been reviewed, patient examined, no change in status, stable for surgery.  I have reviewed the patient's chart and labs.  Questions were answered to the patient's satisfaction.    Cath Lab Visit (complete for each Cath Lab visit)  Clinical Evaluation Leading to the Procedure:   ACS: Yes.    Non-ACS:    Anginal Classification: CCS IV  Anti-ischemic medical therapy: No Therapy  Non-Invasive Test Results: No non-invasive testing performed  Prior CABG: No previous CABG         Lauree Chandler

## 2016-04-17 NOTE — Care Management Note (Signed)
Case Management Note  Patient Details  Name: SOPHAL SHAKOOR MRN: [id number] Date of Birth: 05/07/1946  Subjective/Objective:        Adm w mi            Action/Plan: lives at home, pcp dr Sabra Heck   Expected Discharge Date:                  Expected Discharge Plan:  Home/Self Care  In-House Referral:     Discharge planning Services  CM Consult, Medication Assistance  Post Acute Care Choice:    Choice offered to:     DME Arranged:    DME Agency:     HH Arranged:    [place] Agency:     Status of Service:     If discussed at [employer], dates discussed:    Additional Comments: gave pt 30day free brilinta card. Pt has [place] ins for meds.  Lacretia Leigh, RN 04/17/2016, 10:53 AM

## 2016-04-17 NOTE — Care Management Important Message (Signed)
Important Message  Patient Details  Name: Gilbert Obrien MRN: [id number] Date of Birth: 1946/02/18   Medicare Important Message Given:  Yes    [name] 04/17/2016, 8:41 AM

## 2016-04-18 ENCOUNTER — Encounter (HOSPITAL_COMMUNITY)
Disposition: A | Discharge status: Home / Self Care | Payer: Self-pay | Source: Other Acute Inpatient Hospital | Attending: Cardiovascular Disease

## 2016-04-18 ENCOUNTER — Encounter (HOSPITAL_COMMUNITY): Payer: Self-pay

## 2016-04-18 HISTORY — PX: CARDIAC CATHETERIZATION: SHX172

## 2016-04-18 LAB — CBC
HCT: 35.3 % — ABNORMAL LOW (ref 39.0–52.0)
Hemoglobin: 12.1 g/dL — ABNORMAL LOW (ref 13.0–17.0)
MCH: 28.4 pg (ref 26.0–34.0)
MCHC: 34.3 g/dL (ref 30.0–36.0)
MCV: 82.9 fL (ref 78.0–100.0)
PLATELETS: 187 10*3/uL (ref 150–400)
RBC: 4.26 MIL/uL (ref 4.22–5.81)
RDW: 13.7 % (ref 11.5–15.5)
WBC: 9.8 10*3/uL (ref 4.0–10.5)

## 2016-04-18 LAB — BASIC METABOLIC PANEL
Anion gap: 6 (ref 5–15)
BUN: 14 mg/dL (ref 6–20)
CALCIUM: 8.5 mg/dL — AB (ref 8.9–10.3)
CO2: 21 mmol/L — ABNORMAL LOW (ref 22–32)
CREATININE: 1.31 mg/dL — AB (ref 0.61–1.24)
Chloride: 111 mmol/L (ref 101–111)
GFR calc non Af Amer: 54 mL/min — ABNORMAL LOW (ref >60)
Glucose, Bld: 123 mg/dL — ABNORMAL HIGH (ref 65–99)
Potassium: 3.3 mmol/L — ABNORMAL LOW (ref 3.5–5.1)
SODIUM: 138 mmol/L (ref 135–145)

## 2016-04-18 LAB — POCT ACTIVATED CLOTTING TIME
ACTIVATED CLOTTING TIME: 197 s
ACTIVATED CLOTTING TIME: 345 s

## 2016-04-18 LAB — GLUCOSE, CAPILLARY
GLUCOSE-CAPILLARY: 126 mg/dL — AB (ref 65–99)
Glucose-Capillary: 143 mg/dL — ABNORMAL HIGH (ref 65–99)
Glucose-Capillary: 82 mg/dL (ref 65–99)
Glucose-Capillary: 162 mg/dL — ABNORMAL HIGH (ref 65–99)

## 2016-04-18 SURGERY — CORONARY STENT INTERVENTION

## 2016-04-18 MED ORDER — MIDAZOLAM HCL 2 MG/2ML IJ SOLN
INTRAMUSCULAR | Status: DC | PRN
  Administered 2016-04-18: 1 mg via INTRAVENOUS
  Administered 2016-04-18: 2 mg via INTRAVENOUS

## 2016-04-18 MED ORDER — FENTANYL CITRATE (PF) 100 MCG/2ML IJ SOLN
INTRAMUSCULAR | Status: DC | PRN
  Administered 2016-04-18: 50 ug via INTRAVENOUS
  Administered 2016-04-18: 25 ug via INTRAVENOUS

## 2016-04-18 MED ORDER — BIVALIRUDIN 250 MG IV SOLR
INTRAVENOUS | Status: AC
  Filled 2016-04-18: qty 250

## 2016-04-18 MED ORDER — IOPAMIDOL (ISOVUE-370) INJECTION 76%
INTRAVENOUS | Status: AC
  Filled 2016-04-18: qty 100

## 2016-04-18 MED ORDER — VIPERSLIDE LUBRICANT OPTIME
TOPICAL | Status: DC | PRN
  Administered 2016-04-18: 20 mL via SURGICAL_CAVITY

## 2016-04-18 MED ORDER — BIVALIRUDIN BOLUS VIA INFUSION - CUPID
INTRAVENOUS | Status: DC | PRN
  Administered 2016-04-18: 59.85 mg via INTRAVENOUS

## 2016-04-18 MED ORDER — SODIUM CHLORIDE 0.9 % IV SOLN
250.0000 mL | INTRAVENOUS | Status: DC | PRN
  Administered 2016-04-18: 1000 mL via INTRAVENOUS

## 2016-04-18 MED ORDER — SODIUM CHLORIDE 0.9% FLUSH
3.0000 mL | Freq: Two times a day (BID) | INTRAVENOUS | Status: DC
  Administered 2016-04-18: 3 mL via INTRAVENOUS

## 2016-04-18 MED ORDER — IOPAMIDOL (ISOVUE-370) INJECTION 76%
INTRAVENOUS | Status: AC
  Filled 2016-04-18: qty 125

## 2016-04-18 MED ORDER — POTASSIUM CHLORIDE CRYS ER 20 MEQ PO TBCR
20.0000 meq | EXTENDED_RELEASE_TABLET | Freq: Once | ORAL | Status: AC
  Administered 2016-04-18: 20 meq via ORAL
  Filled 2016-04-18: qty 1

## 2016-04-18 MED ORDER — LIDOCAINE HCL (PF) 1 % IJ SOLN
INTRAMUSCULAR | Status: AC
Start: 2016-04-18 — End: 2016-04-18
  Filled 2016-04-18: qty 30

## 2016-04-18 MED ORDER — MIDAZOLAM HCL 2 MG/2ML IJ SOLN
INTRAMUSCULAR | Status: AC
  Filled 2016-04-18: qty 2

## 2016-04-18 MED ORDER — SODIUM CHLORIDE 0.9 % IV SOLN: INTRAVENOUS | Status: AC

## 2016-04-18 MED ORDER — FENTANYL CITRATE (PF) 100 MCG/2ML IJ SOLN
INTRAMUSCULAR | Status: AC
  Filled 2016-04-18: qty 2

## 2016-04-18 MED ORDER — SODIUM CHLORIDE 0.9 % IV SOLN
INTRAVENOUS | Status: DC | PRN
  Administered 2016-04-18 (×2): 1.75 mg/kg/h via INTRAVENOUS

## 2016-04-18 MED ORDER — HEPARIN (PORCINE) IN NACL 2-0.9 UNIT/ML-% IJ SOLN
INTRAMUSCULAR | Status: DC | PRN
  Administered 2016-04-18: 1000 mL

## 2016-04-18 MED ORDER — NITROGLYCERIN 1 MG/10 ML FOR IR/CATH LAB
INTRA_ARTERIAL | Status: AC
  Filled 2016-04-18: qty 10

## 2016-04-18 MED ORDER — NITROGLYCERIN 1 MG/10 ML FOR IR/CATH LAB
INTRA_ARTERIAL | Status: DC | PRN
  Administered 2016-04-18: 200 ug via INTRACORONARY

## 2016-04-18 MED ORDER — LIDOCAINE HCL (PF) 1 % IJ SOLN
INTRAMUSCULAR | Status: DC | PRN
  Administered 2016-04-18: 20 mL via INTRADERMAL

## 2016-04-18 MED ORDER — HEPARIN (PORCINE) IN NACL 2-0.9 UNIT/ML-% IJ SOLN
INTRAMUSCULAR | Status: AC
  Filled 2016-04-18: qty 1000

## 2016-04-18 MED ORDER — IOPAMIDOL (ISOVUE-370) INJECTION 76%
INTRAVENOUS | Status: DC | PRN
  Administered 2016-04-18: 145 mL via INTRA_ARTERIAL

## 2016-04-18 MED ORDER — SODIUM CHLORIDE 0.9% FLUSH: 3.0000 mL | INTRAVENOUS | Status: DC | PRN

## 2016-04-18 MED FILL — Nitroglycerin IV Soln 100 MCG/ML in D5W: INTRA_ARTERIAL | Qty: 10 | Status: AC

## 2016-04-18 SURGICAL SUPPLY — 26 items
BALLN TREK RX 2.25X15 (BALLOONS) ×4
BALLN ~~LOC~~ TREK RX 4.0X15 (BALLOONS) ×4
BALLOON TREK RX 2.25X15 (BALLOONS) ×2 IMPLANT
BALLOON ~~LOC~~ TREK RX 4.0X15 (BALLOONS) ×2 IMPLANT
CABLE ADAPT CONN TEMP 6FT (ADAPTER) ×4 IMPLANT
CATH MICRO ASAHI CORSAIR 150CM (MICROCATHETER) ×4 IMPLANT
CATH S G BIP PACING (SET/KITS/TRAYS/PACK) ×4 IMPLANT
CATH VISTA GUIDE 6FR AL1 (CATHETERS) ×4 IMPLANT
CATH VISTA GUIDE 7FR AL 1 (CATHETERS) ×4 IMPLANT
CATH VISTA GUIDE 7FR AL2 (CATHETERS) ×4 IMPLANT
CROWN DIAMONDBACK CLASSIC 1.25 (BURR) ×4 IMPLANT
GUIDE CATH MACH 1 7F AL1 (CATHETERS) ×4 IMPLANT
GUIDE CATH RUNWAY 6FR AL 1 (CATHETERS) ×4 IMPLANT
KIT ENCORE 26 ADVANTAGE (KITS) ×8 IMPLANT
KIT HEART LEFT (KITS) ×4 IMPLANT
PACK CARDIAC CATHETERIZATION (CUSTOM PROCEDURE TRAY) ×4 IMPLANT
PINNACLE LONG 7F 25CM (SHEATH) ×4
SHEATH INTRO PINNACLE 7F 25CM (SHEATH) ×2 IMPLANT
SHEATH PINNACLE 6F 10CM (SHEATH) ×4 IMPLANT
SHEATH PINNACLE 7F 10CM (SHEATH) ×4 IMPLANT
STENT SYNERGY DES 4X24 (Permanent Stent) ×4 IMPLANT
TRANSDUCER W/STOPCOCK (MISCELLANEOUS) ×4 IMPLANT
TUBING CIL FLEX 10 FLL-RA (TUBING) ×4 IMPLANT
WIRE COUGAR XT STRL 300CM (WIRE) ×4 IMPLANT
WIRE EMERALD 3MM-J .035X150CM (WIRE) ×4 IMPLANT
WIRE VIPER ADVANCE COR .012TIP (WIRE) ×8 IMPLANT

## 2016-04-18 NOTE — Interval H&P Note (Signed)
History and Physical Interval Note:  04/18/2016 1:50 PM  Gilbert Obrien  has presented today for cardiac cath with the diagnosis of CAD, unstable angina.   The various methods of treatment have been discussed with the patient and family. After consideration of risks, benefits and other options for treatment, the patient has consented to  Procedure(s): Coronary Stent Intervention (N/A) as a surgical intervention .  The patient's history has been reviewed, patient examined, no change in status, stable for surgery.  I have reviewed the patient's chart and labs.  Questions were answered to the patient's satisfaction.    Cath Lab Visit (complete for each Cath Lab visit)  Clinical Evaluation Leading to the Procedure:   ACS: Yes.    Non-ACS:    Anginal Classification: CCS III  Anti-ischemic medical therapy: No Therapy  Non-Invasive Test Results: No non-invasive testing performed  Prior CABG: No previous CABG         Lauree Chandler

## 2016-04-18 NOTE — Progress Notes (Signed)
CARDIAC REHAB PHASE I   PRE:  Rate/Rhythm: 72 SR  BP:  Sitting: 161/79        SaO2: 100 RA  MODE:  Ambulation: 500 ft   POST:  Rate/Rhythm: 79 SR  BP:  Sitting: 151/78         SaO2: 100 RA  Pt ambulated 500 ft on RA, IV, assist x1, steady gait, tolerated well.  Pt c/o mild DOE, general fatigue, denies cp, dizziness, declined rest stop. Completed MI/stent education.  Reviewed risk factors, MI book, anti-platelet therapy, stent card, activity restrictions, ntg, exercise, heart healthy diet, carb counting, and phase 2 cardiac rehab. Pt verbalized understanding, receptive to education, very emotional throughout conversation, emotional support given to pt. Pt agrees to phase 2 cardiac rehab referral, will send to [facility] per pt request. Pt to recliner after walk, call bell within reach. Will follow-up tomorrow.  [phone] Lenna Sciara, RN, BSN 04/18/2016 11:07 AM

## 2016-04-18 NOTE — Progress Notes (Signed)
    Patient is for atherectomy this afternoon.  Daryel November, MD

## 2016-04-18 NOTE — Progress Notes (Signed)
Site area: right groin  Site Prior to Removal:  Level 0  Pressure Applied For 25 MINUTES    Arterial sheath pulled at 1835; venous sheath pulled at [place] at [place]  Manual:   Yes.    Patient Status During Pull:  Stable   Post Pull Groin Site:  Level 0  Post Pull Instructions Given:  Yes.    Post Pull Pulses Present:  Yes.    Dressing Applied:  Yes.    Comments:  Tolerated well.

## 2016-04-18 NOTE — Progress Notes (Signed)
TR BAND REMOVAL  LOCATION:    left radial  DEFLATED PER PROTOCOL:    Yes.    TIME BAND OFF / DRESSING APPLIED:    2300   SITE UPON ARRIVAL:    Level 0  SITE AFTER BAND REMOVAL:    Level 0  CIRCULATION SENSATION AND MOVEMENT:    Within Normal Limits   Yes.    COMMENTS:   Upon taking over as pts primary RN at 1910 (04/17/16) pt called out states he has bleeding from TR band. TR band checked and inflated per protocol and removed per protocol. Mickel Baas RN and myself assessed the site and patient. Pt stable.

## 2016-04-19 ENCOUNTER — Other Ambulatory Visit: Payer: Self-pay | Admitting: Cardiovascular Disease

## 2016-04-19 ENCOUNTER — Other Ambulatory Visit: Payer: Self-pay | Admitting: Cardiology

## 2016-04-19 ENCOUNTER — Encounter (HOSPITAL_COMMUNITY): Payer: Self-pay | Admitting: Cardiovascular Disease

## 2016-04-19 ENCOUNTER — Telehealth: Payer: Self-pay | Admitting: Cardiovascular Disease

## 2016-04-19 DIAGNOSIS — E119 Type 2 diabetes mellitus without complications: Secondary | ICD-10-CM

## 2016-04-19 DIAGNOSIS — S40029A Contusion of unspecified upper arm, initial encounter: Secondary | ICD-10-CM

## 2016-04-19 DIAGNOSIS — N183 Chronic kidney disease, stage 3 unspecified: Secondary | ICD-10-CM

## 2016-04-19 DIAGNOSIS — I255 Ischemic cardiomyopathy: Secondary | ICD-10-CM

## 2016-04-19 DIAGNOSIS — E876 Hypokalemia: Secondary | ICD-10-CM

## 2016-04-19 DIAGNOSIS — E1129 Type 2 diabetes mellitus with other diabetic kidney complication: Secondary | ICD-10-CM

## 2016-04-19 HISTORY — DX: Ischemic cardiomyopathy: I25.5

## 2016-04-19 HISTORY — DX: Contusion of unspecified upper arm, initial encounter: S40.029A

## 2016-04-19 HISTORY — DX: Hypokalemia: E87.6

## 2016-04-19 LAB — CBC
HEMATOCRIT: 31.7 % — AB (ref 39.0–52.0)
HEMOGLOBIN: 10.7 g/dL — AB (ref 13.0–17.0)
MCH: 28.5 pg (ref 26.0–34.0)
MCHC: 33.8 g/dL (ref 30.0–36.0)
MCV: 84.3 fL (ref 78.0–100.0)
Platelets: 186 10*3/uL (ref 150–400)
RBC: 3.76 MIL/uL — AB (ref 4.22–5.81)
RDW: 14.1 % (ref 11.5–15.5)
WBC: 9.4 10*3/uL (ref 4.0–10.5)

## 2016-04-19 LAB — BASIC METABOLIC PANEL
ANION GAP: 4 — AB (ref 5–15)
BUN: 17 mg/dL (ref 6–20)
CHLORIDE: 113 mmol/L — AB (ref 101–111)
CO2: 21 mmol/L — AB (ref 22–32)
Calcium: 8.4 mg/dL — ABNORMAL LOW (ref 8.9–10.3)
Creatinine, Ser: 1.34 mg/dL — ABNORMAL HIGH (ref 0.61–1.24)
GFR calc non Af Amer: 52 mL/min — ABNORMAL LOW (ref >60)
Glucose, Bld: 191 mg/dL — ABNORMAL HIGH (ref 65–99)
POTASSIUM: 3.3 mmol/L — AB (ref 3.5–5.1)
Sodium: 138 mmol/L (ref 135–145)

## 2016-04-19 LAB — GLUCOSE, CAPILLARY: Glucose-Capillary: 135 mg/dL — ABNORMAL HIGH (ref 65–99)

## 2016-04-19 MED ORDER — RAMIPRIL 2.5 MG PO CAPS: 2.5000 mg | ORAL_CAPSULE | Freq: Every day | ORAL | 0 refills | Status: DC

## 2016-04-19 MED ORDER — TICAGRELOR 90 MG PO TABS: 90.0000 mg | ORAL_TABLET | Freq: Two times a day (BID) | ORAL | 12 refills | Status: DC

## 2016-04-19 MED ORDER — ATORVASTATIN CALCIUM 80 MG PO TABS: 80.0000 mg | ORAL_TABLET | Freq: Every day | ORAL | 0 refills | Status: DC

## 2016-04-19 MED ORDER — RAMIPRIL 2.5 MG PO CAPS: 2.5000 mg | ORAL_CAPSULE | Freq: Every day | ORAL | 12 refills | Status: DC

## 2016-04-19 MED ORDER — RAMIPRIL 2.5 MG PO CAPS
2.5000 mg | ORAL_CAPSULE | Freq: Every day | ORAL | Status: DC
  Administered 2016-04-19: 2.5 mg via ORAL
  Filled 2016-04-19: qty 1

## 2016-04-19 MED ORDER — ATORVASTATIN CALCIUM 80 MG PO TABS: 80.0000 mg | ORAL_TABLET | Freq: Every day | ORAL | 12 refills | Status: DC

## 2016-04-19 MED ORDER — NITROGLYCERIN 0.4 MG SL SUBL: 0.4000 mg | SUBLINGUAL_TABLET | SUBLINGUAL | 1 refills | Status: DC | PRN

## 2016-04-19 MED ORDER — POTASSIUM CHLORIDE CRYS ER 20 MEQ PO TBCR
40.0000 meq | EXTENDED_RELEASE_TABLET | Freq: Once | ORAL | Status: AC
  Administered 2016-04-19: 08:00:00 40 meq via ORAL
  Filled 2016-04-19: qty 2

## 2016-04-19 MED ORDER — AMLODIPINE BESYLATE 10 MG PO TABS: 10.0000 mg | ORAL_TABLET | Freq: Every day | ORAL | 12 refills | Status: DC

## 2016-04-19 MED ORDER — TICAGRELOR 90 MG PO TABS: 90.0000 mg | ORAL_TABLET | Freq: Two times a day (BID) | ORAL | 0 refills | Status: DC

## 2016-04-19 MED ORDER — ASPIRIN 81 MG PO TBEC: 81.0000 mg | DELAYED_RELEASE_TABLET | Freq: Every day | ORAL | Status: DC

## 2016-04-19 MED ORDER — METOPROLOL TARTRATE 25 MG PO TABS: 25.0000 mg | ORAL_TABLET | Freq: Two times a day (BID) | ORAL | 12 refills | Status: DC

## 2016-04-19 MED ORDER — METOPROLOL TARTRATE 25 MG PO TABS: 25.0000 mg | ORAL_TABLET | Freq: Two times a day (BID) | ORAL | 0 refills | Status: DC

## 2016-04-19 NOTE — Telephone Encounter (Signed)
Follow Up:     Pt says he still have not received hi medicine and he needs it today> Please call this in asap Make sure it is for all four of his medicine,Atorvastatin, Metoprolol,Ramipril and Nitroglycerin please.

## 2016-04-19 NOTE — Telephone Encounter (Signed)
New message     *STAT* If patient is at the pharmacy, call can be transferred to refill team.   1. Which medications need to be refilled? (please list name of each medication and dose if known) metoprolol 25mg , nitroglycerin 0.4 mg, ramierie 2.5 mg  2. Which pharmacy/location (including street and city if local pharmacy) is medication to be sent to? [facility] in [place] on [address]   3. Do they need a 30 day or 90 day supply? 90, 90, 90

## 2016-04-19 NOTE — Telephone Encounter (Signed)
F/u   Pt calling back about his prescription going to [facility] on [address] in [place], [place]. Atorvastatin is the medicine.     *STAT* If patient is at the pharmacy, call can be transferred to refill team.   1. Which medications need to be refilled? (please list name of each medication and dose if known) Atorvastatin 80mg   2. Which pharmacy/location (including street and city if local pharmacy) is medication to be sent to? [facility] on [address] in , [place]  3. Do they need a 30 day or 90 day supply? [place]

## 2016-04-19 NOTE — Care Management Note (Signed)
Case Management Note  Patient Details  Name: Gilbert Obrien MRN: [id number] Date of Birth: 1946-04-15  Subjective/Objective:    Patient presents with MI, had another stent placed yesterday, will be on Brilinta , co pay is 47.00 and the [facility] in [place] that patient goes to has in stock.  NCM will cont to follow for dc needs.                 Action/Plan:   Expected Discharge Date:                  Expected Discharge Plan:  Home/Self Care  In-House Referral:     Discharge planning Services  CM Consult, Medication Assistance  Post Acute Care Choice:    Choice offered to:     DME Arranged:    DME Agency:     HH Arranged:    HH Agency:     Status of Service:  Completed, signed off  If discussed at [employer] of [employer] Meetings, dates discussed:    Additional Comments:  Zenon Mayo, RN 04/19/2016, 8:59 AM

## 2016-04-19 NOTE — Telephone Encounter (Signed)
Follow Up:   Pt calling to say please let him know when the medicine have been called in please.of Please do this today.
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
## 2016-04-19 NOTE — Telephone Encounter (Signed)
New message       [facility] on [address]in [place] [employer] needs the medications e-scribed to them the pt is out completely
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
## 2016-04-19 NOTE — Telephone Encounter (Signed)
New message    TOC appt on  8.8.2017 @ 8:45 am per Jettie Booze.

## 2016-04-19 NOTE — Progress Notes (Signed)
CARDIAC REHAB PHASE I   PRE:  Rate/Rhythm:79 SR  BP:  Sitting: 132/71        SaO2: 98 RA  MODE:  Ambulation: 500 ft   POST:  Rate/Rhythm: 93 SR  BP:  Sitting: 148/78         SaO2: 100 RA  Pt up in recliner, watching heart attack video. Pt ambulated 500 ft on RA, hand held assist, mostly steady gait, tolerated fairly well. Pt had mild loss of balance while initiating ambulation, c/o moderated DOE, denies any other complaints, declined rest stop. Reviewed education, pt states he has no questions at this time, however, pt is concerned that he will not be able to afford his medications, particularly his brilinta. Also, pt is on lipitor, however, it is listed as an allergy in his chart. RN notified. Pt to recliner after walk, feet elevated, call bell within reach.   [id number] Lenna Sciara, RN, BSN 04/19/2016 8:38 AM

## 2016-04-20 NOTE — Telephone Encounter (Signed)
Patient contacted regarding discharge from [facility]  on 04/19/16.  Patient understands to follow up with provider Richardson Dopp PA-C on 05/02/16 at [place] [facility]. Location. Patient understands discharge instructions? yes Patient understands medications and regiment? yes Patient understands to bring all medications to this visit? yes  Pt has no further questions at this time, and verbalized understanding and agrees with plan mentioned above.

## 2016-04-25 DIAGNOSIS — E1122 Type 2 diabetes mellitus with diabetic chronic kidney disease: Secondary | ICD-10-CM | POA: Diagnosis not present

## 2016-04-25 DIAGNOSIS — I2510 Atherosclerotic heart disease of native coronary artery without angina pectoris: Secondary | ICD-10-CM | POA: Diagnosis not present

## 2016-04-25 DIAGNOSIS — I255 Ischemic cardiomyopathy: Secondary | ICD-10-CM | POA: Diagnosis not present

## 2016-04-25 DIAGNOSIS — M2550 Pain in unspecified joint: Secondary | ICD-10-CM | POA: Diagnosis not present

## 2016-04-25 DIAGNOSIS — M25522 Pain in left elbow: Secondary | ICD-10-CM | POA: Diagnosis not present

## 2016-04-25 DIAGNOSIS — M7022 Olecranon bursitis, left elbow: Secondary | ICD-10-CM | POA: Diagnosis not present

## 2016-04-25 DIAGNOSIS — N183 Chronic kidney disease, stage 3 (moderate): Secondary | ICD-10-CM | POA: Diagnosis not present

## 2016-05-01 DIAGNOSIS — E785 Hyperlipidemia, unspecified: Secondary | ICD-10-CM | POA: Insufficient documentation

## 2016-05-01 DIAGNOSIS — E1169 Type 2 diabetes mellitus with other specified complication: Secondary | ICD-10-CM | POA: Insufficient documentation

## 2016-05-01 DIAGNOSIS — I2510 Atherosclerotic heart disease of native coronary artery without angina pectoris: Secondary | ICD-10-CM | POA: Insufficient documentation

## 2016-05-01 NOTE — Progress Notes (Signed)
Cardiology Office Note:    Date:  05/02/2016   ID:  Gilbert Obrien, DOB 15-Mar-1946, MRN [id number]  PCP:  [name], MD  Cardiologist:  Dr. Sherren Mocha   Electrophysiologist:  n/a  Referring MD: [name], MD   Chief Complaint  Patient presents with  . Hospitalization Follow-up    s/p STEMI >> PCI;  Transitional Care Management Follow Up   History of Present Illness:    Gilbert Obrien is a 70 y.o. male with a hx of HTN, DM, HL, CKD. He was admitted 7/22-7/26 with inferolateral/posterior STEMI. LHC demonstrated subtotal occlusion of the LCx as well as severe stenosis in the proximal LAD and proximal RCA with overall preserved LVEF. He underwent primary PCI with Synergy DES to be LCx. Catheterization was compensated by right radial hematoma treated with compression. He was evaluated by [facility] (Dr. Cyndia Bent). Recommendation was to proceed with multivessel PCI. He went back to the Cath Lab for station [place] and 04/17/16. He underwent DES to the LAD. RCA was heavily calcified. He was brought back the next day for orbital atherectomy of the proximal RCA and placement of DES. Echocardiogram demonstrated EF 35-40% with septal and posterior lateral HK. He was DC'd on aspirin, ticagrelor, atorvastatin 80 mg, ramipril 2.5 mg, metoprolol tartrate 25 mg twice a day.      Returns for FU.  He is here alone today. Since DC, he has been doing well. He denies recurrent anginal symptoms. He denies significant dyspnea. He denies orthopnea or PND. He has chronic dependent edema in his legs. This is fairly mild. He denies syncope. He denies any bleeding issues. He has been able to get all his medication and has remained adherent.  Prior CV studies that were reviewed today include:    LHC 04/18/16 LAD stent ok LCx stent ok RCA prox 90% PCI:  Orbital atherectomy and 4 x 24 mm Synergy DES to RCA 1. Severe heavily calcified stenosis in the proximal to mid RCA.  2. Successful orbital atherectomy of  the proximal RCA stenosis.  3. Successful PTCA/DES x 1 proximal RCA stenosis.    LHC 04/17/16 PCI: 3.5 x 38 mm Synergy DES to proximal and mid LAD   Echo 04/17/16 Septal and post lateral HK, poor image quality, EF 35-40%  UE Arterial US 04/16/16 The right subclavian, axillary, brachial, radial, ulnar, and palmar arch arteries were visualized and found to be patent with triphasic flow.  LHC 04/15/16 LAD prox 50%, mid 90% LCx mid 99% RCA prox 90% PCI: 3.5 x 20 mm Synergy DES to LCx 1. Acute inferolateral STEMI secondary to subtotal occlusion of the left circumflex, treated successfully with primary PCI using a Synergy DES 2. Severe stenosis of the proximal LAD and proximal RCA 3. Mild segmental contraction of the left ventricle with preserved overall LVEF Recommendations: The patient will need further revascularization. He will be continued on tirofiban until a decision is made for CABG versus multivessel PCI. Case d/w Dr Cyndia Bent.   Past Medical History:  Diagnosis Date  . Arthritis   . Coronary artery disease    a. STEMI 03/2016 DESx1 to LCx, DES x 1 Mid LAD, DES x1 prox RCA  . Depression   . Diabetes mellitus without complication ([place])    type 2  . History of acute inferior wall myocardial infarction 04/15/2016   inf-lat/post >> PCI with DES of LCx; staged PCI of LAD and RCA  . Hypercholesteremia   . Hypertension   . Ischemic  cardiomyopathy 04/19/2016   A. Inf-lat/post STEMI 7/17 >> b. Echo 04/17/16: Septal and post lateral HK, poor image quality, EF 35-40%    Past Surgical History:  Procedure Laterality Date  . CARDIAC CATHETERIZATION N/A 04/15/2016   Procedure: Left Heart Cath and Coronary Angiography;  Surgeon: Sherren Mocha, MD;  Location: [facility];  Service: Cardiovascular;  Laterality: N/A;  . CARDIAC CATHETERIZATION N/A 04/15/2016   Procedure: Coronary Stent Intervention;  Surgeon: Sherren Mocha, MD;  Location: [facility];  Service: Cardiovascular;   Laterality: N/A;  . CARDIAC CATHETERIZATION N/A 04/17/2016   Procedure: Coronary Stent Intervention;  Surgeon: Burnell Blanks, MD;  Location: [facility];  Service: Cardiovascular;  Laterality: N/A;  . CARDIAC CATHETERIZATION N/A 04/18/2016   Procedure: Coronary Stent Intervention;  Surgeon: Burnell Blanks, MD;  Location: [facility];  Service: Cardiovascular;  Laterality: N/A;  . CARDIAC CATHETERIZATION N/A 04/18/2016   Procedure: Temporary Pacemaker;  Surgeon: Burnell Blanks, MD;  Location: [facility];  Service: Cardiovascular;  Laterality: N/A;  . CORONARY STENT PLACEMENT  04/17/2016    Severe stenosis proximal LAD, now s/p successful PTCA/DES x 1 proximal and mid LAD  . COSMETIC SURGERY  1974   right side facial       . SHOULDER SURGERY      Current Medications: Current Meds  Medication Sig  . amLODipine (NORVASC) 10 MG tablet Take 1 tablet (10 mg total) by mouth daily.  Marland Kitchen aspirin EC 81 MG EC tablet Take 1 tablet (81 mg total) by mouth daily.  Marland Kitchen atorvastatin (LIPITOR) 80 MG tablet Take 1 tablet (80 mg total) by mouth daily at 6 PM.  . metoprolol tartrate (LOPRESSOR) 25 MG tablet Take 1 tablet (25 mg total) by mouth 2 (two) times daily.  . Multiple Vitamin (MULTIVITAMIN) tablet Take 1 tablet by mouth daily.  . nitroGLYCERIN (NITROSTAT) 0.4 MG SL tablet Place 1 tablet (0.4 mg total) under the tongue every 5 (five) minutes x 3 doses as needed for chest pain.  . nitroGLYCERIN (NITROSTAT) 0.4 MG SL tablet Place 0.4 mg under the tongue every 5 (five) minutes as needed for chest pain.  Marland Kitchen Potassium (POTASSIMIN PO) Take 99 mg by mouth daily.  . ramipril (ALTACE) 2.5 MG capsule Take 1 capsule (2.5 mg total) by mouth daily.  . temazepam (RESTORIL) 15 MG capsule Take 15 mg by mouth at bedtime as needed for sleep.  . ticagrelor (BRILINTA) 90 MG TABS tablet Take 1 tablet (90 mg total) by mouth 2 (two) times daily.  . traMADol (ULTRAM) 50 MG tablet Take 50  mg by mouth every 6 (six) hours as needed for moderate pain.       Allergies:   Atorvastatin; Celecoxib; and Sulfa antibiotics   Social History   Social History  . Marital status: Married    Spouse name: N/A  . Number of children: N/A  . Years of education: N/A   Social History Main Topics  . Smoking status: Former Smoker    Quit date: 09/25/2000  . Smokeless tobacco: Never Used  . Alcohol use No  . Drug use: No  . Sexual activity: Not Asked   Other Topics Concern  . None   Social History Narrative  . None     Family History:  The patient's family history includes Heart attack in his brother; Heart disease in his brother.   ROS:   Please see the history of present illness.  Review of Systems  Constitution: Positive for chills.  Cardiovascular: Positive for dyspnea on exertion and leg swelling.   All other systems reviewed and are negative.   EKGs/Labs/Other Test Reviewed:    EKG:  EKG is  ordered today.  The ekg ordered today demonstrates NSR, HR 71, inf Q waves, TWI in 2, 3, aVF, V5-6, PVC, QTc 460 ms  Recent Labs: 04/15/2016: ALT 17; B Natriuretic Peptide 31.7; TSH 1.509 04/19/2016: BUN 17; Creatinine, Ser 1.34; Hemoglobin 10.7; Platelets 186; Potassium 3.3; Sodium 138   Recent Lipid Panel    Component Value Date/Time   CHOL 175 04/16/2016 0238   TRIG 209 (H) 04/16/2016 0238   HDL 32 (L) 04/16/2016 0238   CHOLHDL 5.5 04/16/2016 0238   VLDL 42 (H) 04/16/2016 0238   LDLCALC 101 (H) 04/16/2016 0238    Physical Exam:    VS:  BP 128/60   Pulse 70   Ht 5\' 7"  (1.702 m)   Wt 177 lb (80.3 kg)   BMI 27.72 kg/m     Wt Readings from Last 3 Encounters:  05/02/16 177 lb (80.3 kg)  04/19/16 179 lb 14.3 oz (81.6 kg)  04/15/16 184 lb (83.5 kg)     Physical Exam  Constitutional: He is oriented to person, place, and time. He appears well-developed and well-nourished. No distress.  HENT:  Head: Normocephalic and atraumatic.  Neck: No JVD present.    Cardiovascular: Normal rate, regular rhythm and normal heart sounds.   No murmur heard. Pulmonary/Chest: Effort normal and breath sounds normal. He has no wheezes. He has no rales.  Abdominal: Soft. There is no tenderness.  Musculoskeletal: He exhibits no edema.  R and L wrist without hematoma R groin without hematoma or bruit  Neurological: He is alert and oriented to person, place, and time.  Skin: Skin is warm and dry.  Psychiatric: He has a normal mood and affect.    ASSESSMENT:    1. ST elevation (STEMI) myocardial infarction involving left circumflex coronary artery ([place])   2. Coronary artery disease involving native coronary artery of native heart without angina pectoris   3. Cardiomyopathy, ischemic   4. CKD (chronic kidney disease) stage 3, GFR 30-59 ml/min   5. HLD (hyperlipidemia)   6. Essential hypertension    PLAN:    In order of problems listed above:  1. S/p Inf-Lat/Post STEMI - He is status post inferolateral/posterior STEMI treated with DES to the LCx. He also underwent staged intervention of the LAD with DES and RCA with DES. He understands the importance of dual at the platelet therapy. We discussed that he would remain on aspirin and Brilinta for a minimum of 12 months. Given his multiple stents, he will likely need prolonged dual antiplatelet therapy. He is not interested in formal cardiac dilatation. I have encouraged him to continue to walk on his.  2. CAD - No recurrent angina. Continue aspirin, Brilinta, beta blocker, ACE inhibitor, high-dose statin.  3. Ischemic CM - EF 35-40% by echocardiogram post MI. No CHF symptoms. Continue beta blocker and ACE inhibitor. Arrange follow-up echo 90 days post MI.  4. CKD - Continue ACE inhibitor. Obtain follow-up BMET today.  5. HL - Continue high-dose statin. Arrange follow-up lipids and LFTs in 3 months.  6. HTN - Blood pressure controlled. Continue current regimen which includes amlodipine, metoprolol,  ramipril.   Medication Adjustments/Labs and Tests Ordered: Current medicines are reviewed at length with the patient today.  Concerns regarding medicines are outlined above.  Medication changes, Labs and Tests ordered today are outlined in the Patient Instructions noted below. Patient Instructions  Medication Instructions:  No changes.  See your medication list.  Labwork: Today - BMET In 3 months - Lipids/LFTs (come in fasting)  Testing/Procedures: Schedule an Echocardiogram in 3 months (on or after 07/16/16).  Follow-Up: Dr. Sherren Mocha in 3 months - same day as your Echocardiogram and Lab Tests  Any Other Special Instructions Will Be Listed Below (If Applicable). If you need a refill on your cardiac medications before your next appointment, please call your pharmacy.   Signed, Richardson Dopp, PA-C  05/02/2016 1:02 PM    [facility] [place], [place], [place]  [address] Phone: [phone]; Fax: [phone]

## 2016-05-02 ENCOUNTER — Ambulatory Visit (INDEPENDENT_AMBULATORY_CARE_PROVIDER_SITE_OTHER): Payer: Commercial Managed Care - HMO | Admitting: Physician Assistant

## 2016-05-02 ENCOUNTER — Telehealth: Payer: Self-pay | Admitting: Cardiovascular Disease

## 2016-05-02 ENCOUNTER — Encounter: Payer: Self-pay | Admitting: Physician Assistant

## 2016-05-02 VITALS — BP 128/60 | HR 70 | Ht 67.0 in | Wt 177.0 lb

## 2016-05-02 DIAGNOSIS — N183 Chronic kidney disease, stage 3 unspecified: Secondary | ICD-10-CM

## 2016-05-02 DIAGNOSIS — E785 Hyperlipidemia, unspecified: Secondary | ICD-10-CM

## 2016-05-02 DIAGNOSIS — I2510 Atherosclerotic heart disease of native coronary artery without angina pectoris: Secondary | ICD-10-CM

## 2016-05-02 DIAGNOSIS — I2121 ST elevation (STEMI) myocardial infarction involving left circumflex coronary artery: Secondary | ICD-10-CM

## 2016-05-02 DIAGNOSIS — I10 Essential (primary) hypertension: Secondary | ICD-10-CM

## 2016-05-02 DIAGNOSIS — I255 Ischemic cardiomyopathy: Secondary | ICD-10-CM | POA: Diagnosis not present

## 2016-05-02 DIAGNOSIS — I152 Hypertension secondary to endocrine disorders: Secondary | ICD-10-CM | POA: Insufficient documentation

## 2016-05-02 LAB — BASIC METABOLIC PANEL
BUN: 10 mg/dL (ref 7–25)
CO2: 24 mmol/L (ref 20–31)
CREATININE: 1.46 mg/dL — AB (ref 0.70–1.18)
Calcium: 9.1 mg/dL (ref 8.6–10.3)
Chloride: 107 mmol/L (ref 98–110)
GLUCOSE: 152 mg/dL — AB (ref 65–99)
Potassium: 4 mmol/L (ref 3.5–5.3)
Sodium: 139 mmol/L (ref 135–146)

## 2016-05-02 NOTE — Telephone Encounter (Signed)
Spoke with pt who is asking if OK to do light housekeeping around his house including vacuuming.  Pt reports cath site is healing well.  I told him this should be OK and that he should gradually increase activity.  He reports he is going to contact cardiac rehab about attending rehab sessions.

## 2016-05-02 NOTE — Telephone Encounter (Signed)
New Message  Pt call requesting to speak with RN. Pt wanted to ask if it was okay for him to do cleaning around the house after post heart attack. Please call back to discuss

## 2016-05-02 NOTE — Patient Instructions (Addendum)
Medication Instructions:  No changes.  See your medication list.  Labwork: Today - BMET In 3 months - Lipids/LFTs (come in fasting)  Testing/Procedures: Schedule an Echocardiogram in 3 months (on or after 07/16/16).  Follow-Up: Dr. Sherren Mocha in 3 months - same day as your Echocardiogram and Lab Tests  Any Other Special Instructions Will Be Listed Below (If Applicable). If you need a refill on your cardiac medications before your next appointment, please call your pharmacy.

## 2016-05-03 ENCOUNTER — Telehealth: Payer: Self-pay | Admitting: Cardiovascular Disease

## 2016-05-03 NOTE — Telephone Encounter (Signed)
Pt is aware of lab results and PA's recommendations. Pt verbalized understanding.

## 2016-05-03 NOTE — Telephone Encounter (Signed)
New message   Pt verbalized that he is returning a call from yesterday for lab results

## 2016-05-08 DIAGNOSIS — M7022 Olecranon bursitis, left elbow: Secondary | ICD-10-CM | POA: Diagnosis not present

## 2016-05-08 DIAGNOSIS — N183 Chronic kidney disease, stage 3 (moderate): Secondary | ICD-10-CM | POA: Diagnosis not present

## 2016-05-08 DIAGNOSIS — E1122 Type 2 diabetes mellitus with diabetic chronic kidney disease: Secondary | ICD-10-CM | POA: Diagnosis not present

## 2016-05-08 DIAGNOSIS — M25522 Pain in left elbow: Secondary | ICD-10-CM | POA: Diagnosis not present

## 2016-05-09 DIAGNOSIS — M25522 Pain in left elbow: Secondary | ICD-10-CM | POA: Diagnosis not present

## 2016-05-09 DIAGNOSIS — M7022 Olecranon bursitis, left elbow: Secondary | ICD-10-CM | POA: Diagnosis not present

## 2016-05-11 ENCOUNTER — Encounter: Payer: Commercial Managed Care - HMO | Attending: Cardiovascular Disease | Admitting: *Deleted

## 2016-05-11 VITALS — Ht 65.0 in | Wt 177.9 lb

## 2016-05-11 DIAGNOSIS — Z955 Presence of coronary angioplasty implant and graft: Secondary | ICD-10-CM | POA: Insufficient documentation

## 2016-05-11 NOTE — Progress Notes (Signed)
Cardiac Individual Treatment Plan  Patient Details  Name: Gilbert Obrien MRN: [id number] Date of Birth: May 06, 1946 Referring Provider:   Flowsheet Row Cardiac Rehab from 05/11/2016 in [facility] Cardiac and Pulmonary Rehab  Referring Provider  [name]      Initial Encounter Date:  Flowsheet Row Cardiac Rehab from 05/11/2016 in [facility] Cardiac and Pulmonary Rehab  Date  05/11/16  Referring Provider  [name]      Visit Diagnosis: S/P coronary artery stent placement  Patient's Home Medications on Admission:  Current Outpatient Prescriptions:  .  amLODipine (NORVASC) 10 MG tablet, Take 1 tablet (10 mg total) by mouth daily., Disp: 30 tablet, Rfl: 12 .  aspirin EC 81 MG EC tablet, Take 1 tablet (81 mg total) by mouth daily., Disp: , Rfl:  .  atorvastatin (LIPITOR) 80 MG tablet, Take 1 tablet (80 mg total) by mouth daily at 6 PM., Disp: 30 tablet, Rfl: 0 .  metoprolol tartrate (LOPRESSOR) 25 MG tablet, Take 1 tablet (25 mg total) by mouth 2 (two) times daily., Disp: 60 tablet, Rfl: 0 .  Multiple Vitamin (MULTIVITAMIN) tablet, Take 1 tablet by mouth daily., Disp: , Rfl:  .  nitroGLYCERIN (NITROSTAT) 0.4 MG SL tablet, Place 1 tablet (0.4 mg total) under the tongue every 5 (five) minutes x 3 doses as needed for chest pain., Disp: 25 tablet, Rfl: 1 .  nitroGLYCERIN (NITROSTAT) 0.4 MG SL tablet, Place 0.4 mg under the tongue every 5 (five) minutes as needed for chest pain., Disp: , Rfl:  .  Potassium (POTASSIMIN PO), Take 99 mg by mouth daily., Disp: , Rfl:  .  ramipril (ALTACE) 2.5 MG capsule, Take 1 capsule (2.5 mg total) by mouth daily., Disp: 30 capsule, Rfl: 0 .  temazepam (RESTORIL) 15 MG capsule, Take 15 mg by mouth at bedtime as needed for sleep., Disp: , Rfl:  .  ticagrelor (BRILINTA) 90 MG TABS tablet, Take 1 tablet (90 mg total) by mouth 2 (two) times daily., Disp: 60 tablet, Rfl: 0 .  traMADol (ULTRAM) 50 MG tablet, Take 50 mg by mouth every 6 (six) hours as needed for moderate pain., Disp: ,  Rfl:   Past Medical History: Past Medical History:  Diagnosis Date  . Arthritis   . Coronary artery disease    a. STEMI 03/2016 DESx1 to LCx, DES x 1 Mid LAD, DES x1 prox RCA  . Depression   . Diabetes mellitus without complication ([place])    type 2  . History of acute inferior wall myocardial infarction 04/15/2016   inf-lat/post >> PCI with DES of LCx; staged PCI of LAD and RCA  . Hypercholesteremia   . Hypertension   . Ischemic cardiomyopathy 04/19/2016   A. Inf-lat/post STEMI 7/17 >> b. Echo 04/17/16: Septal and post lateral HK, poor image quality, EF 35-40%    Tobacco Use: History  Smoking Status  . Former Smoker  . Quit date: 09/25/2000  Smokeless Tobacco  . Never Used    Labs: Recent Review Flowsheet Data    Labs for ITP Cardiac and Pulmonary Rehab Latest Ref Rng & Units 04/15/2016 04/16/2016   Cholestrol 0 - 200 mg/dL 222(H) 175   LDLCALC 0 - 99 mg/dL 146(H) 101(H)   HDL >40 mg/dL 41 32(L)   Trlycerides <150 mg/dL 177(H) 209(H)   Hemoglobin A1c 4.8 - 5.6 % 7.1(H) -       Exercise Target Goals: Date: 05/11/16  Exercise Program Goal: Individual exercise prescription set with THRR, safety & activity barriers. Participant demonstrates ability  to understand and report RPE using BORG scale, to self-measure pulse accurately, and to acknowledge the importance of the exercise prescription.  Exercise Prescription Goal: Starting with aerobic activity 30 plus minutes a day, 3 days per week for initial exercise prescription. Provide home exercise prescription and guidelines that participant acknowledges understanding prior to discharge.  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 HR 118 bpm     Max Ex. BP 134/60        Initial Exercise Prescription:     Initial Exercise Prescription - 05/11/16 1400      Date of Initial Exercise RX and Referring Provider   Date 05/11/16   Referring Provider [name]     Treadmill   MPH 1.6   Grade 0.5   Minutes 15   METs 2.25     Recumbant Bike   Level 1   RPM 60   Minutes 15   METs 2     NuStep   Level 3   Minutes 15   METs 2     T5 Nustep   Level 2   Minutes 15   METs 2     Prescription Details   Frequency (times per week) 3   Duration Progress to 45 minutes of aerobic exercise without signs/symptoms of physical distress     Intensity   THRR 40-80% of Max Heartrate 112-137   Ratings of Perceived Exertion 11-13     Progression   Progression Continue to progress workloads to maintain intensity without signs/symptoms of physical distress.     Resistance Training   Training Prescription Yes   Weight 3   Reps 10-15      Perform Capillary Blood Glucose checks as needed.  Exercise Prescription Changes:   Exercise Comments:   Discharge Exercise Prescription (Final Exercise Prescription Changes):   Nutrition:  Target Goals: Understanding of nutrition guidelines, daily intake of sodium 1500mg , cholesterol 200mg , calories 30% from fat and 7% or less from saturated fats, daily to have 5 or more servings of fruits and vegetables.  Biometrics:     Pre Biometrics - 05/11/16 1408      Pre Biometrics   Height 5\' 5"  (1.651 m)   Weight 177 lb 14.4 oz (80.7 kg)   Waist Circumference 43.25 inches   Hip Circumference 39.25 inches   Waist to Hip Ratio 1.1 %   BMI (Calculated) 29.7   Single Leg Stand 8.33 seconds       Nutrition Therapy Plan and Nutrition Goals:     Nutrition Therapy & Goals -  05/11/16 1347      Nutrition Therapy   Drug/Food Interactions Statins/Certain Fruits      Nutrition Discharge: Rate Your  Plate Scores:     Nutrition Assessments - 05/11/16 1427      Rate Your Plate Scores   Pre Score 73   Pre Score % 81.1 %      Nutrition Goals Re-Evaluation:   Psychosocial: Target Goals: Acknowledge presence or absence of depression, maximize coping skills, provide positive support system. Participant is able to verbalize types and ability to use techniques and skills needed for reducing stress and depression.  Initial Review & Psychosocial Screening:     Initial Psych Review & Screening - 05/11/16 1429      Initial Review   Current issues with Current Stress Concerns   Comments Ray reports he may have to have surgery on his hematoma of his arm since they have drained it many times and it keeps returning. Ray has a friend and she has diabetes and angina and he said it would help both of them to be in [place].      Family Dynamics   Good Support System? Yes     Barriers   Psychosocial barriers to participate in program The patient should benefit from training in stress management and relaxation.     Screening Interventions   Interventions Encouraged to exercise;Program counselor consult      Quality of Life Scores:     Quality of Life - 05/11/16 1427      Quality of Life Scores   Health/Function Pre 11.6 %   Socioeconomic Pre 20.71 %   Psych/Spiritual Pre 10.29 %   Family Pre 26.4 %   GLOBAL Pre 15.38 %      PHQ-9: Recent Review Flowsheet Data    Depression screen [facility] 2/9 05/11/2016   Decreased Interest 1   Down, Depressed, Hopeless 1   PHQ - 2 Score 2   Altered sleeping 1   Tired, decreased energy 1   Change in appetite 0   Feeling bad or failure about yourself  0   Trouble concentrating 0   Moving slowly or fidgety/restless 0   Suicidal thoughts 0   PHQ-9 Score 4   Difficult doing work/chores Somewhat difficult       Psychosocial Evaluation and Intervention:   Psychosocial Re-Evaluation:   Vocational Rehabilitation: Provide vocational rehab assistance to qualifying candidates.   Vocational Rehab Evaluation & Intervention:     Vocational Rehab - 05/11/16 1345      Initial Vocational Rehab Evaluation & Intervention   Assessment shows need for Vocational Rehabilitation No      Education: Education Goals: Education classes will be provided on a weekly basis, covering required topics. Participant will state understanding/return demonstration of topics presented.  Learning Barriers/Preferences:     Learning Barriers/Preferences - 05/11/16 1344      Learning Barriers/Preferences   Learning Barriers None   Learning Preferences Group Instruction      Education Topics: General Nutrition Guidelines/Fats and Fiber: -Group instruction provided by verbal, written material, models and posters to present the general guidelines for heart healthy nutrition. Gives an explanation and review of dietary fats and fiber.   Controlling Sodium/Reading Food Labels: -Group verbal and written material supporting the discussion of sodium use in heart healthy nutrition. Review and explanation with models, verbal and written materials for utilization of the food label.   Exercise Physiology & Risk Factors: - Group verbal and written instruction with models to review the exercise physiology of the cardiovascular system and associated critical values. Details cardiovascular disease risk factors and the goals associated with each risk factor.  Aerobic Exercise & Resistance Training: - Gives group verbal and written discussion on the health impact of inactivity. On the components of aerobic and resistive training programs and the benefits of this training and how to safely progress through these programs.   Flexibility, Balance, General Exercise Guidelines: - Provides group verbal and written instruction on  the benefits of flexibility and balance training programs. Provides general exercise guidelines with specific guidelines to those with heart or lung disease. Demonstration and skill practice provided.   Stress Management: - Provides group verbal and written instruction about the health risks of elevated stress, cause of high stress, and healthy ways to reduce stress.   Depression: - Provides group verbal and written instruction on the correlation between heart/lung disease and depressed mood, treatment options, and the stigmas associated with seeking treatment.   Anatomy & Physiology of the Heart: - Group verbal and written instruction and models provide basic cardiac anatomy and physiology, with the coronary electrical and arterial systems. Review of: AMI, Angina, Valve disease, Heart Failure, Cardiac Arrhythmia, Pacemakers, and the ICD.   Cardiac Procedures: - Group verbal and written instruction and models to describe the testing methods done to diagnose heart disease. Reviews the outcomes of the test results. Describes the treatment choices: Medical Management, Angioplasty, or Coronary Bypass Surgery.   Cardiac Medications: - Group verbal and written instruction to review commonly prescribed medications for heart disease. Reviews the medication, class of the drug, and side effects. Includes the steps to properly store meds and maintain the prescription regimen.   Go Sex-Intimacy & Heart Disease, Get SMART - Goal Setting: - Group verbal and written instruction through game format to discuss heart disease and the return to sexual intimacy. Provides group verbal and written material to discuss and apply goal setting through the application of the S.M.A.R.T. Method.   Other Matters of the Heart: - Provides group verbal, written materials and models to describe Heart Failure, Angina, Valve Disease, and Diabetes in the realm of heart disease. Includes description of the disease process and  treatment options available to the cardiac patient.   Exercise & Equipment Safety: - Individual verbal instruction and demonstration of equipment use and safety with use of the equipment. Flowsheet Row Cardiac Rehab from 05/11/2016 in [facility] Cardiac and Pulmonary Rehab  Date  05/11/16  Educator  C. EnterkinRN  Instruction Review Code  1- partially meets, needs review/practice      Infection Prevention: - Provides verbal and written material to individual with discussion of infection control including proper hand washing and proper equipment cleaning during exercise session. Flowsheet Row Cardiac Rehab from 05/11/2016 in [facility] Cardiac and Pulmonary Rehab  Date  05/11/16  Educator  C. EnterkinRN  Instruction Review Code  2- meets goals/outcomes      Falls Prevention: - Provides verbal and written material to individual with discussion of falls prevention and safety. Flowsheet Row Cardiac Rehab from 05/11/2016 in [facility] Cardiac and Pulmonary Rehab  Date  05/11/16  Educator  C. [place]  Instruction Review Code  2- meets goals/outcomes      Diabetes: - Individual verbal and written instruction to review signs/symptoms of diabetes, desired ranges of glucose level fasting, after meals and with exercise. Advice that pre and post exercise glucose checks will be done for 3 sessions at entry of program. [place] from 05/11/2016 in [facility] Cardiac and Pulmonary Rehab  Date  05/11/16  Educator  C. [place]  Instruction Review Code  1- partially meets, needs review/practice  Knowledge Questionnaire Score:     Knowledge Questionnaire Score - 05/11/16 1344      Knowledge Questionnaire Score   Pre Score 22      Core Components/Risk Factors/Patient Goals at Admission:     Personal Goals and Risk Factors at Admission - 05/11/16 1428      Core Components/Risk Factors/Patient Goals on Admission   Increase Strength and Stamina Yes   Intervention Provide advice,  education, support and counseling about physical activity/exercise needs.;Develop an individualized exercise prescription for aerobic and resistive training based on initial evaluation findings, risk stratification, comorbidities and participant's personal goals.   Expected Outcomes Achievement of increased cardiorespiratory fitness and enhanced flexibility, muscular endurance and strength shown through measurements of functional capacity and personal statement of participant.   Diabetes Yes   Intervention Provide education about signs/symptoms and action to take for hypo/hyperglycemia.;Provide education about proper nutrition, including hydration, and aerobic/resistive exercise prescription along with prescribed medications to achieve blood glucose in normal ranges: Fasting glucose 65-99 mg/dL   Expected Outcomes Short Term: Participant verbalizes understanding of the signs/symptoms and immediate care of hyper/hypoglycemia, proper foot care and importance of medication, aerobic/resistive exercise and nutrition plan for blood glucose control.;Long Term: Attainment of HbA1C < 7%.   Hypertension Yes   Intervention Provide education on lifestyle modifcations including regular physical activity/exercise, weight management, moderate sodium restriction and increased consumption of fresh fruit, vegetables, and low fat dairy, alcohol moderation, and smoking cessation.;Monitor prescription use compliance.   Expected Outcomes Short Term: Continued assessment and intervention until BP is < 140/72mm HG in hypertensive participants. < 130/1mm HG in hypertensive participants with diabetes, heart failure or chronic kidney disease.;Long Term: Maintenance of blood pressure at goal levels.   Lipids Yes   Intervention Provide education and support for participant on nutrition & aerobic/resistive exercise along with prescribed medications to achieve LDL 70mg , HDL >40mg .   Expected Outcomes Short Term: Participant states  understanding of desired cholesterol values and is compliant with medications prescribed. Participant is following exercise prescription and nutrition guidelines.;Long Term: Cholesterol controlled with medications as prescribed, with individualized exercise RX and with personalized nutrition plan. Value goals: LDL < 70mg , HDL > 40 mg.      Core Components/Risk Factors/Patient Goals Review:    Core Components/Risk Factors/Patient Goals at Discharge (Final Review):    ITP Comments:     ITP Comments    Row Name 05/11/16 1346 05/11/16 1347         ITP Comments Ray wishes to have his girlfriend who had a heart attack 4 months ago join him in Cardiac Rehab since he said he believes it will help both of them alot.  Individual appt made with the Cardiac Rehab Registered dietician since [place] said that is one of his main concerns to eat healthier. He believes he is eating healthier not but wants to see if RD has any other suggestions.         Comments:

## 2016-05-11 NOTE — Patient Instructions (Addendum)
Patient Instructions  Patient Details  Name: Gilbert Obrien MRN: [id number] Date of Birth: [birth year] Referring Provider:  Sherren Mocha, MD  Below are the personal goals you chose as well as exercise and nutrition goals. Our goal is to help you keep on track towards obtaining and maintaining your goals. We will be discussing your progress on these goals with you throughout the program.  Initial Exercise Prescription:   Exercise Goals: Frequency: Be able to perform aerobic exercise three times per week working toward 3-5 days per week.  Intensity: Work with a perceived exertion of 11 (fairly light) - 15 (hard) as tolerated. Follow your new exercise prescription and watch for changes in prescription as you progress with the program. Changes will be reviewed with you when they are made.  Duration: You should be able to do 30 minutes of continuous aerobic exercise in addition to a 5 minute warm-up and a 5 minute cool-down routine.  Nutrition Goals: Your personal nutrition goals will be established when you do your nutrition analysis with the dietician.  The following are nutrition guidelines to follow: Cholesterol < 200mg /day Sodium < 1500mg /day Fiber: Men over 50 yrs - 30 grams per day  Personal Goals:   Tobacco Use Initial Evaluation: History  Smoking Status  . Former Smoker  . Quit date: 09/25/2000  Smokeless Tobacco  . Never Used    Copy of goals given to participant. Patient Instructions  Patient Details  Name: Gilbert Obrien MRN: [id number] Date of Birth: 05/23/1946 Referring Provider:  Sherren Mocha, MD  Below are the personal goals you chose as well as exercise and nutrition goals. Our goal is to help you keep on track towards obtaining and maintaining your goals. We will be discussing your progress on these goals with you throughout the program.  Initial Exercise Prescription:     Initial Exercise Prescription - 05/11/16 1400      Date of Initial  Exercise RX and Referring Provider   Date 05/11/16   Referring Provider [name]     Treadmill   MPH 1.6   Grade 0.5   Minutes 15   METs 2.25     Recumbant Bike   Level 1   RPM 60   Minutes 15   METs 2     NuStep   Level 3   Minutes 15   METs 2     T5 Nustep   Level 2   Minutes 15   METs 2     Prescription Details   Frequency (times per week) 3   Duration Progress to 45 minutes of aerobic exercise without signs/symptoms of physical distress     Intensity   THRR 40-80% of Max Heartrate 112-137   Ratings of Perceived Exertion 11-13     Progression   Progression Continue to progress workloads to maintain intensity without signs/symptoms of physical distress.     Resistance Training   Training Prescription Yes   Weight 3   Reps 10-15      Exercise Goals: Frequency: Be able to perform aerobic exercise three times per week working toward 3-5 days per week.  Intensity: Work with a perceived exertion of 11 (fairly light) - 15 (hard) as tolerated. Follow your new exercise prescription and watch for changes in prescription as you progress with the program. Changes will be reviewed with you when they are made.  Duration: You should be able to do 30 minutes of continuous aerobic exercise in addition to a 5 minute warm-up and a  5 minute cool-down routine.  Nutrition Goals: Your personal nutrition goals will be established when you do your nutrition analysis with the dietician.  The following are nutrition guidelines to follow: Cholesterol < 200mg /day Sodium < 1500mg /day Fiber: Men over 50 yrs - 30 grams per day  Personal Goals:     Personal Goals and Risk Factors at Admission - 05/11/16 1428      Core Components/Risk Factors/Patient Goals on Admission   Increase Strength and Stamina Yes   Intervention Provide advice, education, support and counseling about physical activity/exercise needs.;Develop an individualized exercise prescription for aerobic and resistive  training based on initial evaluation findings, risk stratification, comorbidities and participant's personal goals.   Expected Outcomes Achievement of increased cardiorespiratory fitness and enhanced flexibility, muscular endurance and strength shown through measurements of functional capacity and personal statement of participant.   Diabetes Yes   Intervention Provide education about signs/symptoms and action to take for hypo/hyperglycemia.;Provide education about proper nutrition, including hydration, and aerobic/resistive exercise prescription along with prescribed medications to achieve blood glucose in normal ranges: Fasting glucose 65-99 mg/dL   Expected Outcomes Short Term: Participant verbalizes understanding of the signs/symptoms and immediate care of hyper/hypoglycemia, proper foot care and importance of medication, aerobic/resistive exercise and nutrition plan for blood glucose control.;Long Term: Attainment of HbA1C < 7%.   Hypertension Yes   Intervention Provide education on lifestyle modifcations including regular physical activity/exercise, weight management, moderate sodium restriction and increased consumption of fresh fruit, vegetables, and low fat dairy, alcohol moderation, and smoking cessation.;Monitor prescription use compliance.   Expected Outcomes Short Term: Continued assessment and intervention until BP is < 140/46mm HG in hypertensive participants. < 130/72mm HG in hypertensive participants with diabetes, heart failure or chronic kidney disease.;Long Term: Maintenance of blood pressure at goal levels.   Lipids Yes   Intervention Provide education and support for participant on nutrition & aerobic/resistive exercise along with prescribed medications to achieve LDL 70mg , HDL >40mg .   Expected Outcomes Short Term: Participant states understanding of desired cholesterol values and is compliant with medications prescribed. Participant is following exercise prescription and nutrition  guidelines.;Long Term: Cholesterol controlled with medications as prescribed, with individualized exercise RX and with personalized nutrition plan. Value goals: LDL < 70mg , HDL > 40 mg.      Tobacco Use Initial Evaluation: History  Smoking Status  . Former Smoker  . Quit date: 09/25/2000  Smokeless Tobacco  . Never Used    Copy of goals given to participant.

## 2016-05-11 NOTE — Progress Notes (Signed)
Cardiac Individual Treatment Plan  Patient Details  Name: LAITH EBERSOLD MRN: [id number] Date of Birth: 30-Mar-1946 Referring Provider:    Initial Encounter Date:   Visit Diagnosis: S/P coronary artery stent placement  Patient's Home Medications on Admission:  Current Outpatient Prescriptions:  .  amLODipine (NORVASC) 10 MG tablet, Take 1 tablet (10 mg total) by mouth daily., Disp: 30 tablet, Rfl: 12 .  aspirin EC 81 MG EC tablet, Take 1 tablet (81 mg total) by mouth daily., Disp: , Rfl:  .  atorvastatin (LIPITOR) 80 MG tablet, Take 1 tablet (80 mg total) by mouth daily at 6 PM., Disp: 30 tablet, Rfl: 0 .  metoprolol tartrate (LOPRESSOR) 25 MG tablet, Take 1 tablet (25 mg total) by mouth 2 (two) times daily., Disp: 60 tablet, Rfl: 0 .  Multiple Vitamin (MULTIVITAMIN) tablet, Take 1 tablet by mouth daily., Disp: , Rfl:  .  nitroGLYCERIN (NITROSTAT) 0.4 MG SL tablet, Place 1 tablet (0.4 mg total) under the tongue every 5 (five) minutes x 3 doses as needed for chest pain., Disp: 25 tablet, Rfl: 1 .  nitroGLYCERIN (NITROSTAT) 0.4 MG SL tablet, Place 0.4 mg under the tongue every 5 (five) minutes as needed for chest pain., Disp: , Rfl:  .  Potassium (POTASSIMIN PO), Take 99 mg by mouth daily., Disp: , Rfl:  .  ramipril (ALTACE) 2.5 MG capsule, Take 1 capsule (2.5 mg total) by mouth daily., Disp: 30 capsule, Rfl: 0 .  temazepam (RESTORIL) 15 MG capsule, Take 15 mg by mouth at bedtime as needed for sleep., Disp: , Rfl:  .  ticagrelor (BRILINTA) 90 MG TABS tablet, Take 1 tablet (90 mg total) by mouth 2 (two) times daily., Disp: 60 tablet, Rfl: 0 .  traMADol (ULTRAM) 50 MG tablet, Take 50 mg by mouth every 6 (six) hours as needed for moderate pain., Disp: , Rfl:   Past Medical History: Past Medical History:  Diagnosis Date  . Arthritis   . Coronary artery disease    a. STEMI 03/2016 DESx1 to LCx, DES x 1 Mid LAD, DES x1 prox RCA  . Depression   . Diabetes mellitus without complication ([place])     type 2  . History of acute inferior wall myocardial infarction 04/15/2016   inf-lat/post >> PCI with DES of LCx; staged PCI of LAD and RCA  . Hypercholesteremia   . Hypertension   . Ischemic cardiomyopathy 04/19/2016   A. Inf-lat/post STEMI 7/17 >> b. Echo 04/17/16: Septal and post lateral HK, poor image quality, EF 35-40%    Tobacco Use: History  Smoking Status  . Former Smoker  . Quit date: 09/25/2000  Smokeless Tobacco  . Never Used    Labs: Recent Review Flowsheet Data    Labs for ITP Cardiac and Pulmonary Rehab Latest Ref Rng & Units 04/15/2016 04/16/2016   Cholestrol 0 - 200 mg/dL 222(H) 175   LDLCALC 0 - 99 mg/dL 146(H) 101(H)   HDL >40 mg/dL 41 32(L)   Trlycerides <150 mg/dL 177(H) 209(H)   Hemoglobin A1c 4.8 - 5.6 % 7.1(H) -       Exercise Target Goals:    Exercise Program Goal: Individual exercise prescription set with THRR, safety & activity barriers. Participant demonstrates ability to understand and report RPE using BORG scale, to self-measure pulse accurately, and to acknowledge the importance of the exercise prescription.  Exercise Prescription Goal: Starting with aerobic activity 30 plus minutes a day, 3 days per week for initial exercise prescription. Provide home exercise prescription  and guidelines that participant acknowledges understanding prior to discharge.  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  Exercise Prescription Changes:   Exercise Comments:   Discharge Exercise Prescription (Final Exercise Prescription Changes):   Nutrition:  Target Goals: Understanding of nutrition guidelines, daily intake of  sodium 1500mg , cholesterol 200mg , calories 30% from fat and 7% or less from saturated fats, daily to have 5 or more servings of fruits and vegetables.  Biometrics:    Nutrition Therapy Plan and Nutrition Goals:     Nutrition Therapy & Goals - 05/11/16 1347      Nutrition Therapy   Drug/Food Interactions Statins/Certain Fruits      Nutrition Discharge: Rate Your Plate Scores:   Nutrition Goals Re-Evaluation:   Psychosocial: Target Goals: Acknowledge presence or absence of depression, maximize coping skills, provide positive support system. Participant is able to verbalize types and ability to use techniques and skills needed for reducing stress and depression.  Initial Review & Psychosocial Screening:   Quality of Life Scores:   PHQ-9: Recent Review Flowsheet Data    Depression screen [facility] 2/9 05/11/2016   Decreased Interest 1   Down, Depressed, Hopeless 1   PHQ - 2 Score 2   Altered sleeping 1   Tired, decreased energy 1   Change in appetite 0   Feeling bad or failure about yourself  0   Trouble concentrating 0   Moving slowly or fidgety/restless 0   Suicidal thoughts 0   PHQ-9 Score 4   Difficult doing work/chores Somewhat difficult      Psychosocial Evaluation and Intervention:   Psychosocial Re-Evaluation:   Vocational Rehabilitation: Provide vocational rehab assistance to qualifying candidates.   Vocational Rehab Evaluation & Intervention:     Vocational Rehab - 05/11/16 1345      Initial Vocational Rehab Evaluation & Intervention   Assessment shows need for Vocational Rehabilitation No      Education: Education Goals: Education classes will be provided on a weekly basis, covering required topics. Participant will state understanding/return demonstration of topics presented.  Learning Barriers/Preferences:     Learning Barriers/Preferences - 05/11/16 1344      Learning Barriers/Preferences   Learning Barriers None   Learning  Preferences Group Instruction      Education Topics: General Nutrition Guidelines/Fats and Fiber: -Group instruction provided by verbal, written material, models and posters to present the general guidelines for heart healthy nutrition. Gives an explanation and review of dietary fats and fiber.   Controlling Sodium/Reading Food Labels: -Group verbal and written material supporting the discussion of sodium use in heart healthy nutrition. Review and explanation with models, verbal and written materials for utilization of the food label.   Exercise Physiology & Risk Factors: - Group verbal and written instruction with models to review the exercise physiology of the cardiovascular system and associated critical values. Details cardiovascular disease risk factors and the goals associated with each risk factor.   Aerobic Exercise & Resistance Training: - Gives group verbal and written discussion on the health impact of inactivity. On the components of aerobic and resistive training programs and the benefits of this training and how to  safely progress through these programs.   Flexibility, Balance, General Exercise Guidelines: - Provides group verbal and written instruction on the benefits of flexibility and balance training programs. Provides general exercise guidelines with specific guidelines to those with heart or lung disease. Demonstration and skill practice provided.   Stress Management: - Provides group verbal and written instruction about the health risks of elevated stress, cause of high stress, and healthy ways to reduce stress.   Depression: - Provides group verbal and written instruction on the correlation between heart/lung disease and depressed mood, treatment options, and the stigmas associated with seeking treatment.   Anatomy & Physiology of the Heart: - Group verbal and written instruction and models provide basic cardiac anatomy and physiology, with the coronary  electrical and arterial systems. Review of: AMI, Angina, Valve disease, Heart Failure, Cardiac Arrhythmia, Pacemakers, and the ICD.   Cardiac Procedures: - Group verbal and written instruction and models to describe the testing methods done to diagnose heart disease. Reviews the outcomes of the test results. Describes the treatment choices: Medical Management, Angioplasty, or Coronary Bypass Surgery.   Cardiac Medications: - Group verbal and written instruction to review commonly prescribed medications for heart disease. Reviews the medication, class of the drug, and side effects. Includes the steps to properly store meds and maintain the prescription regimen.   Go Sex-Intimacy & Heart Disease, Get SMART - Goal Setting: - Group verbal and written instruction through game format to discuss heart disease and the return to sexual intimacy. Provides group verbal and written material to discuss and apply goal setting through the application of the S.M.A.R.T. Method.   Other Matters of the Heart: - Provides group verbal, written materials and models to describe Heart Failure, Angina, Valve Disease, and Diabetes in the realm of heart disease. Includes description of the disease process and treatment options available to the cardiac patient.   Exercise & Equipment Safety: - Individual verbal instruction and demonstration of equipment use and safety with use of the equipment. Flowsheet Row Cardiac Rehab from 05/11/2016 in [facility] Cardiac and Pulmonary Rehab  Date  05/11/16  Educator  C. EnterkinRN  Instruction Review Code  1- partially meets, needs review/practice      Infection Prevention: - Provides verbal and written material to individual with discussion of infection control including proper hand washing and proper equipment cleaning during exercise session. Flowsheet Row Cardiac Rehab from 05/11/2016 in [facility] Cardiac and Pulmonary Rehab  Date  05/11/16  Educator  C. EnterkinRN  Instruction  Review Code  2- meets goals/outcomes      Falls Prevention: - Provides verbal and written material to individual with discussion of falls prevention and safety. Flowsheet Row Cardiac Rehab from 05/11/2016 in [facility] Cardiac and Pulmonary Rehab  Date  05/11/16  Educator  C. [place]  Instruction Review Code  2- meets goals/outcomes      Diabetes: - Individual verbal and written instruction to review signs/symptoms of diabetes, desired ranges of glucose level fasting, after meals and with exercise. Advice that pre and post exercise glucose checks will be done for 3 sessions at entry of program. [place] from 05/11/2016 in [facility] Cardiac and Pulmonary Rehab  Date  05/11/16  Educator  C. EnterkinRN  Instruction Review Code  1- partially meets, needs review/practice       Knowledge Questionnaire Score:     Knowledge Questionnaire Score - 05/11/16 1344      Knowledge Questionnaire Score   Pre Score 22  Core Components/Risk Factors/Patient Goals at Admission:   Core Components/Risk Factors/Patient Goals Review:    Core Components/Risk Factors/Patient Goals at Discharge (Final Review):    ITP Comments:     ITP Comments    Row Name 05/11/16 1346 05/11/16 1347         ITP Comments Ray wishes to have his girlfriend who had a heart attack 4 months ago join him in Cardiac Rehab since he said he believes it will help both of them alot.  Individual appt made with the Cardiac Rehab Registered dietician since [place] said that is one of his main concerns to eat healthier. He believes he is eating healthier not but wants to see if RD has any other suggestions.         Comments:

## 2016-05-12 ENCOUNTER — Telehealth: Payer: Self-pay | Admitting: Cardiovascular Disease

## 2016-05-12 NOTE — Telephone Encounter (Signed)
New message        Request for surgical clearance:  What type of surgery is being performed? Left olecranon bursectomy----elbow surgery When is this surgery scheduled? Pending clearance 1. Are there any medications that need to be held prior to surgery and how long? Any medications to hold?  Also need surgical clearance  2. Name of physician performing surgery? Dr Milagros Evener  What is your office phone and fax number? Fax [phone] or send thru epic and let them know it is in epic

## 2016-05-15 NOTE — Telephone Encounter (Signed)
The patient just had a STEMI in July. He underwent multivessel PCI. He cannot hold blood thinning medicines for a period of 12 months. Could potentially have surgery after 3 months as long as he is clinically stable and can do surgery on the blood thinners.

## 2016-05-15 NOTE — Telephone Encounter (Signed)
I left a voicemail message for Tiffany in regards to Dr Antionette Char response in [place].

## 2016-05-18 ENCOUNTER — Encounter: Payer: Commercial Managed Care - HMO | Admitting: *Deleted

## 2016-05-18 DIAGNOSIS — Z955 Presence of coronary angioplasty implant and graft: Secondary | ICD-10-CM | POA: Diagnosis not present

## 2016-05-18 NOTE — Progress Notes (Signed)
Daily Session Note  Patient Details  Name: TAIJUAN SERVISS MRN: [id number] Date of Birth: 16-Sep-1946 Referring Provider:   Flowsheet Row Cardiac Rehab from 05/11/2016 in [facility] Cardiac and Pulmonary Rehab  Referring Provider  [name]      Encounter Date: 05/18/2016  Check In:     Session Check In - 05/18/16 1733      Check-In   Staff Present Heath Lark, RN, BSN, CCRP;Mary Kellie Shropshire, RN, BSN, Francene Boyers, DPT, [place] physician immediately available to respond to emergencies See telemetry face sheet for immediately available ER MD   Medication changes reported     No   Fall or balance concerns reported    No   Warm-up and Cool-down Performed on first and last piece of equipment   Resistance Training Performed Yes   VAD Patient? No     Pain Assessment   Currently in Pain? No/denies         Goals Met:  Exercise tolerated well No report of cardiac concerns or symptoms Strength training completed today  Goals Unmet:  Not Applicable  Comments: Doing well with exercise prescription progression.    Dr. Emily Filbert is Medical Director for [place] and [facility].

## 2016-05-22 DIAGNOSIS — Z955 Presence of coronary angioplasty implant and graft: Secondary | ICD-10-CM

## 2016-05-22 LAB — GLUCOSE, CAPILLARY
GLUCOSE-CAPILLARY: 150 mg/dL — AB (ref 65–99)
Glucose-Capillary: 127 mg/dL — ABNORMAL HIGH (ref 65–99)

## 2016-05-22 NOTE — Progress Notes (Signed)
Daily Session Note  Patient Details  Name: Gilbert Obrien MRN: [id number] Date of Birth: 04-13-1946 Referring Provider:   Flowsheet Row Cardiac Rehab from 05/11/2016 in [facility] Cardiac and Pulmonary Rehab  Referring Provider  [name]      Encounter Date: 05/22/2016  Check In:     Session Check In - 05/22/16 1631      Check-In   Location [facility]-Cardiac & Pulmonary Rehab   Staff Present Nyoka Cowden, RN, BSN, [place], BS, ACSM CEP, Exercise Physiologist;Gudrun Axe Oletta Darter, [place], ACSM CEP, Exercise Physiologist   Supervising physician immediately available to respond to emergencies See telemetry face sheet for immediately available ER MD   Medication changes reported     No   Fall or balance concerns reported    No   Warm-up and Cool-down Performed on first and last piece of equipment   Resistance Training Performed Yes   VAD Patient? No     Pain Assessment   Currently in Pain? No/denies   Multiple Pain Sites No         Goals Met:  Independence with exercise equipment Exercise tolerated well No report of cardiac concerns or symptoms Strength training completed today  Goals Unmet:  Not Applicable  Comments: Pt able to follow exercise prescription today without complaint.  Will continue to monitor for progression.    Dr. Emily Filbert is Medical Director for [place] and [facility].

## 2016-05-23 NOTE — Telephone Encounter (Signed)
Okay. Thanks!  We will hold off on his surgery until cleared medically.

## 2016-05-24 ENCOUNTER — Encounter: Payer: Self-pay | Admitting: Dietician

## 2016-05-24 DIAGNOSIS — Z955 Presence of coronary angioplasty implant and graft: Secondary | ICD-10-CM | POA: Diagnosis not present

## 2016-05-24 LAB — GLUCOSE, CAPILLARY: GLUCOSE-CAPILLARY: 84 mg/dL (ref 65–99)

## 2016-05-24 NOTE — Progress Notes (Signed)
Daily Session Note  Patient Details  Name: Gilbert Obrien MRN: [id number] Date of Birth: 08/08/1946 Referring Provider:   Flowsheet Row Cardiac Rehab from 05/11/2016 in [facility] Cardiac and Pulmonary Rehab  Referring Provider  [name]      Encounter Date: 05/24/2016  Check In:     Session Check In - 05/24/16 1715      Check-In   Location [facility]-Cardiac & Pulmonary Rehab   Staff Present Heath Lark, RN, BSN, Lance Sell, BA, ACSM CEP, Exercise Physiologist;Carroll Enterkin, RN, BSN   Supervising physician immediately available to respond to emergencies See telemetry face sheet for immediately available ER MD   Medication changes reported     No   Fall or balance concerns reported    No   Warm-up and Cool-down Performed on first and last piece of equipment   Resistance Training Performed Yes   VAD Patient? No     Pain Assessment   Currently in Pain? No/denies   Multiple Pain Sites No         Goals Met:  Independence with exercise equipment Exercise tolerated well No report of cardiac concerns or symptoms Strength training completed today  Goals Unmet:  Not Applicable  Comments: Pt able to follow exercise prescription today without complaint.  Will continue to monitor for progression.    Dr. Emily Filbert is Medical Director for [place] and [facility].

## 2016-05-25 DIAGNOSIS — Z955 Presence of coronary angioplasty implant and graft: Secondary | ICD-10-CM | POA: Diagnosis not present

## 2016-05-25 LAB — GLUCOSE, CAPILLARY
GLUCOSE-CAPILLARY: 174 mg/dL — AB (ref 65–99)
GLUCOSE-CAPILLARY: 96 mg/dL (ref 65–99)

## 2016-05-25 NOTE — Progress Notes (Signed)
Daily Session Note  Patient Details  Name: Gilbert Obrien MRN: [id number] Date of Birth: 15-May-1946 Referring Provider:   Flowsheet Row Cardiac Rehab from 05/11/2016 in [facility] Cardiac and Pulmonary Rehab  Referring Provider  [name]      Encounter Date: 05/25/2016  Check In:     Session Check In - 05/25/16 1707      Check-In   Location [facility]-Cardiac & Pulmonary Rehab   Staff Present Jeanell Sparrow, DPT, Burlene Arnt, BA, ACSM CEP, Exercise Physiologist;Carroll Enterkin, RN, BSN   Supervising physician immediately available to respond to emergencies See telemetry face sheet for immediately available ER MD   Medication changes reported     No   Fall or balance concerns reported    No   Warm-up and Cool-down Performed on first and last piece of equipment   Resistance Training Performed Yes   VAD Patient? No     Pain Assessment   Currently in Pain? No/denies   Multiple Pain Sites No         Goals Met:  Exercise tolerated well  Goals Unmet:  Not Applicable  Comments: Patient completed exercise prescription and all exercise goals during rehab session. The exercise was tolerated well and the patient is progressing in the program.    Dr. Emily Filbert is Medical Director for [place] and [facility].

## 2016-05-31 ENCOUNTER — Encounter: Payer: Commercial Managed Care - HMO | Attending: Cardiovascular Disease | Admitting: *Deleted

## 2016-05-31 DIAGNOSIS — Z955 Presence of coronary angioplasty implant and graft: Secondary | ICD-10-CM | POA: Diagnosis not present

## 2016-05-31 LAB — GLUCOSE, CAPILLARY
GLUCOSE-CAPILLARY: 160 mg/dL — AB (ref 65–99)
Glucose-Capillary: 107 mg/dL — ABNORMAL HIGH (ref 65–99)

## 2016-05-31 NOTE — Progress Notes (Signed)
Daily Session Note  Patient Details  Name: Gilbert Obrien MRN: [id number] Date of Birth: 30-Jun-1946 Referring Provider:   Flowsheet Row Cardiac Rehab from 05/11/2016 in [facility] Cardiac and Pulmonary Rehab  Referring Provider  [name]      Encounter Date: 05/31/2016  Check In:     Session Check In - 05/31/16 1750      Check-In   Staff Present Heath Lark, RN, BSN, CCRP;Carroll Enterkin, RN, Vickki Hearing, BA, ACSM CEP, Exercise Physiologist   Supervising physician immediately available to respond to emergencies See telemetry face sheet for immediately available ER MD   Medication changes reported     No   Fall or balance concerns reported    No   Warm-up and Cool-down Performed on first and last piece of equipment   Resistance Training Performed Yes   VAD Patient? No     Pain Assessment   Currently in Pain? No/denies         Goals Met:  Exercise tolerated well No report of cardiac concerns or symptoms Strength training completed today  Goals Unmet:  Not Applicable  Comments: Doing well with exercise prescription progression.    Dr. Emily Filbert is Medical Director for [place] and [facility].

## 2016-06-02 ENCOUNTER — Encounter: Payer: Self-pay | Admitting: *Deleted

## 2016-06-02 ENCOUNTER — Telehealth: Payer: Self-pay | Admitting: *Deleted

## 2016-06-02 NOTE — Telephone Encounter (Signed)
Gilbert Obrien called yesterday and said he was sorry that he couldn't attend Cardiac Rehab yesterday.

## 2016-06-05 ENCOUNTER — Encounter: Payer: Commercial Managed Care - HMO | Admitting: *Deleted

## 2016-06-05 DIAGNOSIS — Z955 Presence of coronary angioplasty implant and graft: Secondary | ICD-10-CM | POA: Diagnosis not present

## 2016-06-05 NOTE — Progress Notes (Signed)
Daily Session Note  Patient Details  Name: Gilbert Obrien MRN: [id number] Date of Birth: 1946/02/22 Referring Provider:   Flowsheet Row Cardiac Rehab from 05/11/2016 in [facility] Cardiac and Pulmonary Rehab  Referring Provider  [name]      Encounter Date: 06/05/2016  Check In:     Session Check In - 06/05/16 1627      Check-In   Location [facility]-Cardiac & Pulmonary Rehab   Staff Present Heath Lark, RN, BSN, Laveda Norman, BS, ACSM CEP, Exercise Physiologist;Carroll Enterkin, RN, BSN   Supervising physician immediately available to respond to emergencies See telemetry face sheet for immediately available ER MD   Medication changes reported     No   Fall or balance concerns reported    No   Warm-up and Cool-down Performed on first and last piece of equipment   Resistance Training Performed Yes   VAD Patient? No     Pain Assessment   Currently in Pain? No/denies   Multiple Pain Sites No         Goals Met:  Independence with exercise equipment Exercise tolerated well No report of cardiac concerns or symptoms Strength training completed today  Goals Unmet:  Not Applicable  Comments: Pt able to follow exercise prescription today without complaint.  Will continue to monitor for progression.    Dr. Emily Filbert is Medical Director for [place] and [facility].

## 2016-06-07 ENCOUNTER — Encounter: Payer: Commercial Managed Care - HMO | Admitting: *Deleted

## 2016-06-07 ENCOUNTER — Encounter: Payer: Self-pay | Admitting: *Deleted

## 2016-06-07 DIAGNOSIS — Z955 Presence of coronary angioplasty implant and graft: Secondary | ICD-10-CM

## 2016-06-07 NOTE — Progress Notes (Signed)
Daily Session Note  Patient Details  Name: LEXTON HIDALGO MRN: [id number] Date of Birth: 07-08-1946 Referring Provider:   Flowsheet Row Cardiac Rehab from 05/11/2016 in [facility] Cardiac and Pulmonary Rehab  Referring Provider  [name]      Encounter Date: 06/07/2016  Check In:     Session Check In - 06/07/16 1659      Check-In   Location [facility]-Cardiac & Pulmonary Rehab   Staff Present Nyoka Cowden, RN, BSN, MA;Anselm Aumiller, RN, Vickki Hearing, BA, ACSM CEP, Exercise Physiologist   Supervising physician immediately available to respond to emergencies See telemetry face sheet for immediately available ER MD   Medication changes reported     No   Fall or balance concerns reported    No   Warm-up and Cool-down Performed on first and last piece of equipment   Resistance Training Performed Yes   VAD Patient? No     Pain Assessment   Currently in Pain? No/denies         Goals Met:  Proper associated with RPD/PD & O2 Sat No report of cardiac concerns or symptoms  Goals Unmet:  Not Applicable  Comments:     Dr. Emily Filbert is Medical Director for [place] and [facility].

## 2016-06-07 NOTE — Progress Notes (Signed)
Cardiac Individual Treatment Plan  Patient Details  Name: Gilbert Obrien MRN: [id number] Date of Birth: 09/11/1946 Referring Provider:   Flowsheet Row Cardiac Rehab from 05/11/2016 in [facility] Cardiac and Pulmonary Rehab  Referring Provider  [name]      Initial Encounter Date:  Flowsheet Row Cardiac Rehab from 05/11/2016 in [facility] Cardiac and Pulmonary Rehab  Date  05/11/16  Referring Provider  [name]      Visit Diagnosis: S/P coronary artery stent placement  Patient's Home Medications on Admission:  Current Outpatient Prescriptions:  .  amLODipine (NORVASC) 10 MG tablet, Take 1 tablet (10 mg total) by mouth daily., Disp: 30 tablet, Rfl: 12 .  aspirin EC 81 MG EC tablet, Take 1 tablet (81 mg total) by mouth daily., Disp: , Rfl:  .  atorvastatin (LIPITOR) 80 MG tablet, Take 1 tablet (80 mg total) by mouth daily at 6 PM., Disp: 30 tablet, Rfl: 0 .  metoprolol tartrate (LOPRESSOR) 25 MG tablet, Take 1 tablet (25 mg total) by mouth 2 (two) times daily., Disp: 60 tablet, Rfl: 0 .  Multiple Vitamin (MULTIVITAMIN) tablet, Take 1 tablet by mouth daily., Disp: , Rfl:  .  nitroGLYCERIN (NITROSTAT) 0.4 MG SL tablet, Place 1 tablet (0.4 mg total) under the tongue every 5 (five) minutes x 3 doses as needed for chest pain., Disp: 25 tablet, Rfl: 1 .  nitroGLYCERIN (NITROSTAT) 0.4 MG SL tablet, Place 0.4 mg under the tongue every 5 (five) minutes as needed for chest pain., Disp: , Rfl:  .  Potassium (POTASSIMIN PO), Take 99 mg by mouth daily., Disp: , Rfl:  .  ramipril (ALTACE) 2.5 MG capsule, Take 1 capsule (2.5 mg total) by mouth daily., Disp: 30 capsule, Rfl: 0 .  temazepam (RESTORIL) 15 MG capsule, Take 15 mg by mouth at bedtime as needed for sleep., Disp: , Rfl:  .  ticagrelor (BRILINTA) 90 MG TABS tablet, Take 1 tablet (90 mg total) by mouth 2 (two) times daily., Disp: 60 tablet, Rfl: 0 .  traMADol (ULTRAM) 50 MG tablet, Take 50 mg by mouth every 6 (six) hours as needed for moderate pain., Disp: ,  Rfl:   Past Medical History: Past Medical History:  Diagnosis Date  . Arthritis   . Coronary artery disease    a. STEMI 03/2016 DESx1 to LCx, DES x 1 Mid LAD, DES x1 prox RCA  . Depression   . Diabetes mellitus without complication ([place])    type 2  . History of acute inferior wall myocardial infarction 04/15/2016   inf-lat/post >> PCI with DES of LCx; staged PCI of LAD and RCA  . Hypercholesteremia   . Hypertension   . Ischemic cardiomyopathy 04/19/2016   A. Inf-lat/post STEMI 7/17 >> b. Echo 04/17/16: Septal and post lateral HK, poor image quality, EF 35-40%    Tobacco Use: History  Smoking Status  . Former Smoker  . Quit date: 09/25/2000  Smokeless Tobacco  . Never Used    Labs: Recent Review Flowsheet Data    Labs for ITP Cardiac and Pulmonary Rehab Latest Ref Rng & Units 04/15/2016 04/16/2016   Cholestrol 0 - 200 mg/dL 222(H) 175   LDLCALC 0 - 99 mg/dL 146(H) 101(H)   HDL >40 mg/dL 41 32(L)   Trlycerides <150 mg/dL 177(H) 209(H)   Hemoglobin A1c 4.8 - 5.6 % 7.1(H) -       Exercise Target Goals:    Exercise Program Goal: Individual exercise prescription set with THRR, safety & activity barriers. Participant demonstrates ability  to understand and report RPE using BORG scale, to self-measure pulse accurately, and to acknowledge the importance of the exercise prescription.  Exercise Prescription Goal: Starting with aerobic activity 30 plus minutes a day, 3 days per week for initial exercise prescription. Provide home exercise prescription and guidelines that participant acknowledges understanding prior to discharge.  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 HR 118 bpm     Max Ex. BP 134/60        Initial Exercise Prescription:     Initial Exercise Prescription - 05/11/16 1400      Date of Initial Exercise RX and Referring Provider   Date 05/11/16   Referring Provider [name]     Treadmill   MPH 1.6   Grade 0.5   Minutes 15   METs 2.25     Recumbant Bike   Level 1   RPM 60   Minutes 15   METs 2     NuStep   Level 3   Minutes 15   METs 2     T5 Nustep   Level 2   Minutes 15   METs 2     Prescription Details   Frequency (times per week) 3   Duration Progress to 45 minutes of aerobic exercise without signs/symptoms of physical distress     Intensity   THRR 40-80% of Max Heartrate 112-137   Ratings of Perceived Exertion 11-13     Progression   Progression Continue to progress workloads to maintain intensity without signs/symptoms of physical distress.     Resistance Training   Training Prescription Yes   Weight 3   Reps 10-15      Perform Capillary Blood Glucose checks as needed.  Exercise Prescription Changes:     Exercise Prescription Changes    Row Name 06/01/16 1100             Exercise Review   Progression Yes         Response to Exercise   Blood Pressure (Admit) 122/66       Blood Pressure (Exercise) 148/62       Blood Pressure (Exit) 132/70       Heart Rate (Admit) 68 bpm       Heart Rate (Exercise) 97 bpm       Heart Rate (Exit) 55 bpm       Symptoms none         Progression   Progression Continue to progress workloads to maintain intensity without signs/symptoms of physical distress.       Average METs 2.87         Resistance Training   Training Prescription Yes  Weight 3       Reps 10-15         Interval Training   Interval Training No         Recumbant Bike   Level 1       RPM 60        Minutes 15       METs 3.14         REL-XR   Level 1       Minutes 15       METs 2.6          Exercise Comments:     Exercise Comments    Row Name 05/18/16 1311 06/01/16 1113         Exercise Comments Gilbert Obrien has not attended a Heart Ttrack class as of 05/18/16. Gilbert Obrien has done well with exercise.         Discharge Exercise Prescription (Final Exercise Prescription Changes):     Exercise Prescription Changes - 06/01/16 1100      Exercise Review   Progression Yes     Response to Exercise   Blood Pressure (Admit) 122/66   Blood Pressure (Exercise) 148/62   Blood Pressure (Exit) 132/70   Heart Rate (Admit) 68 bpm   Heart Rate (Exercise) 97 bpm   Heart Rate (Exit) 55 bpm   Symptoms none     Progression   Progression Continue to progress workloads to maintain intensity without signs/symptoms of physical distress.   Average METs 2.87     Resistance Training   Training Prescription Yes   Weight 3   Reps 10-15     Interval Training   Interval Training No     Recumbant Bike   Level 1   RPM 60   Minutes 15   METs 3.14     REL-XR   Level 1   Minutes 15   METs 2.6      Nutrition:  Target Goals: Understanding of nutrition guidelines, daily intake of sodium <1512m, cholesterol <2081m calories 30% from fat and 7% or less from saturated fats, daily to have 5 or more servings of fruits and vegetables.  Biometrics:     Pre Biometrics - 05/11/16 1408      Pre Biometrics   Height _0  (1.651 m)   Weight 177 lb 14.4 oz (80.7 kg)   Waist Circumference 43.25 inches   Hip Circumference 39.25 inches   Waist to Hip Ratio 1.1 %   BMI (Calculated) 29.7   Single Leg Stand 8.33 seconds       Nutrition Therapy Plan and Nutrition Goals:     Nutrition Therapy & Goals - 05/24/16 1642      Nutrition Therapy   Diet 1600kcal basic heart healthy diet   Protein (specify units) 7oz   Fiber 25 grams   Whole Grain Foods 3 servings   Saturated Fats 12 max. grams    Fruits and Vegetables 5 servings/day   Sodium 1500 grams     Personal Nutrition Goals   Personal Goal #1 Use menus (provided) for more meal ideas, to add variety of heart healthy options   Comments Patient is working to [employer] himself; he states he needs more ideas of what he can eat. Provided sample menus for 2 weeks of ideas.      Intervention Plan   Intervention Prescribe, educate and counsel regarding individualized specific dietary modifications aiming towards targeted core components such as weight, hypertension, lipid management, diabetes, heart failure  and other comorbidities.;Nutrition handout(s) given to patient.   Expected Outcomes Short Term Goal: Understand basic principles of dietary content, such as calories, fat, sodium, cholesterol and nutrients.;Short Term Goal: A plan has been developed with personal nutrition goals set during dietitian appointment.;Long Term Goal: Adherence to prescribed nutrition plan.      Nutrition Discharge: Rate Your Plate Scores:     Nutrition Assessments - 05/11/16 1427      Rate Your Plate Scores   Pre Score 73   Pre Score % 81.1 %      Nutrition Goals Re-Evaluation:   Psychosocial: Target Goals: Acknowledge presence or absence of depression, maximize coping skills, provide positive support system. Participant is able to verbalize types and ability to use techniques and skills needed for reducing stress and depression.  Initial Review & Psychosocial Screening:     Initial Psych Review & Screening - 05/11/16 1429      Initial Review   Current issues with Current Stress Concerns   Comments Gilbert Obrien reports he may have to have surgery on his hematoma of his arm since they have drained it many times and it keeps returning. Gilbert Obrien has a friend and she has diabetes and angina and he said it would help both of them to be in [place].      Family Dynamics   Good Support System? Yes     Barriers   Psychosocial barriers to participate  in program The patient should benefit from training in stress management and relaxation.     Screening Interventions   Interventions Encouraged to exercise;Program counselor consult      Quality of Life Scores:     Quality of Life - 05/11/16 1427      Quality of Life Scores   Health/Function Pre 11.6 %   Socioeconomic Pre 20.71 %   Psych/Spiritual Pre 10.29 %   Family Pre 26.4 %   GLOBAL Pre 15.38 %      PHQ-9: Recent Review Flowsheet Data    Depression screen [facility] 2/9 05/11/2016   Decreased Interest 1   Down, Depressed, Hopeless 1   PHQ - 2 Score 2   Altered sleeping 1   Tired, decreased energy 1   Change in appetite 0   Feeling bad or failure about yourself  0   Trouble concentrating 0   Moving slowly or fidgety/restless 0   Suicidal thoughts 0   PHQ-9 Score 4   Difficult doing work/chores Somewhat difficult      Psychosocial Evaluation and Intervention:     Psychosocial Evaluation - 06/05/16 1725      Psychosocial Evaluation & Interventions   Interventions Encouraged to exercise with the program and follow exercise prescription   Comments Counselor met with Gilbert Obrien today for initial psychosocial evaluation.  He is a 70 year old who had a heart attack this past July.  Gilbert Obrien states he has a girlfriend locally who is his primary support person.  all other family live out of town.  He reports having bad shoulders & knees along with back problems  in addition to his cardiac issues.  Gilbert Obrien does not sleep well and has medications that he takes PRN to help with this.  His appetite has improved since the surgery per his report.  Gilbert Obrien admits to having a history of depression earlier in his life and occasionally has symptoms possibly 2/7 days per week.  He states his mood is "stable" at this time and his PHQ-9 score was  only "4."  Counselor recommended that if consistent exercise and taking the sleep medication more often does not improve the current mood and depressive  symptoms that Gilbert Obrien should consult with his Dr. about this.  He agreed to do so and states he has an appointment already scheduled for later this month.  Gilbert Obrien has some stress being on a fixed income and with his current health issues as well.  He has goals to increase his stamina and strength while in this program.  Counselor will continue to follow with Gilbert. Obrien throughout this Cardiac Rehab Program.     Continued Psychosocial Services Needed Yes  Follow up on whether sleep and exercise improve mood symptoms or consult with Dr.  Abbott Pao. agreed.        Psychosocial Re-Evaluation:   Vocational Rehabilitation: Provide vocational rehab assistance to qualifying candidates.   Vocational Rehab Evaluation & Intervention:     Vocational Rehab - 05/11/16 1345      Initial Vocational Rehab Evaluation & Intervention   Assessment shows need for Vocational Rehabilitation No      Education: Education Goals: Education classes will be provided on a weekly basis, covering required topics. Participant will state understanding/return demonstration of topics presented.  Learning Barriers/Preferences:     Learning Barriers/Preferences - 05/11/16 1344      Learning Barriers/Preferences   Learning Barriers None   Learning Preferences Group Instruction      Education Topics: General Nutrition Guidelines/Fats and Fiber: -Group instruction provided by verbal, written material, models and posters to present the general guidelines for heart healthy nutrition. Gives an explanation and review of dietary fats and fiber.   Controlling Sodium/Reading Food Labels: -Group verbal and written material supporting the discussion of sodium use in heart healthy nutrition. Review and explanation with models, verbal and written materials for utilization of the food label.   Exercise Physiology & Risk Factors: - Group verbal and written instruction with models to review the exercise physiology of the  cardiovascular system and associated critical values. Details cardiovascular disease risk factors and the goals associated with each risk factor.   Aerobic Exercise & Resistance Training: - Gives group verbal and written discussion on the health impact of inactivity. On the components of aerobic and resistive training programs and the benefits of this training and how to safely progress through these programs.   Flexibility, Balance, General Exercise Guidelines: - Provides group verbal and written instruction on the benefits of flexibility and balance training programs. Provides general exercise guidelines with specific guidelines to those with heart or lung disease. Demonstration and skill practice provided.   Stress Management: - Provides group verbal and written instruction about the health risks of elevated stress, cause of high stress, and healthy ways to reduce stress.   Depression: - Provides group verbal and written instruction on the correlation between heart/lung disease and depressed mood, treatment options, and the stigmas associated with seeking treatment.   Anatomy & Physiology of the Heart: - Group verbal and written instruction and models provide basic cardiac anatomy and physiology, with the coronary electrical and arterial systems. Review of: AMI, Angina, Valve disease, Heart Failure, Cardiac Arrhythmia, Pacemakers, and the ICD.   Cardiac Procedures: - Group verbal and written instruction and models to describe the testing methods done to diagnose heart disease. Reviews the outcomes of the test results. Describes the treatment choices: Medical Management, Angioplasty, or Coronary Bypass Surgery. Flowsheet Row Cardiac Rehab from 06/05/2016 in [facility] Cardiac and Pulmonary Rehab  Date  05/22/16  Educator  MJA  Instruction Review Code  2- meets goals/outcomes      Cardiac Medications: - Group verbal and written instruction to review commonly prescribed medications for  heart disease. Reviews the medication, class of the drug, and side effects. Includes the steps to properly store meds and maintain the prescription regimen. Flowsheet Row Cardiac Rehab from 06/05/2016 in [facility] Cardiac and Pulmonary Rehab  Date  05/31/16  Educator  SB  Instruction Review Code  2- meets goals/outcomes      Go Sex-Intimacy & Heart Disease, Get SMART - Goal Setting: - Group verbal and written instruction through game format to discuss heart disease and the return to sexual intimacy. Provides group verbal and written material to discuss and apply goal setting through the application of the S.M.A.R.T. Method. Flowsheet Row Cardiac Rehab from 06/05/2016 in [facility] Cardiac and Pulmonary Rehab  Date  05/22/16  Educator  [place]  Instruction Review Code  2- meets goals/outcomes      Other Matters of the Heart: - Provides group verbal, written materials and models to describe Heart Failure, Angina, Valve Disease, and Diabetes in the realm of heart disease. Includes description of the disease process and treatment options available to the cardiac patient.   Exercise & Equipment Safety: - Individual verbal instruction and demonstration of equipment use and safety with use of the equipment. Flowsheet Row Cardiac Rehab from 06/05/2016 in [facility] Cardiac and Pulmonary Rehab  Date  05/11/16  Educator  C. EnterkinRN  Instruction Review Code  1- partially meets, needs review/practice      Infection Prevention: - Provides verbal and written material to individual with discussion of infection control including proper hand washing and proper equipment cleaning during exercise session. Flowsheet Row Cardiac Rehab from 06/05/2016 in [facility] Cardiac and Pulmonary Rehab  Date  05/11/16  Educator  C. EnterkinRN  Instruction Review Code  2- meets goals/outcomes      Falls Prevention: - Provides verbal and written material to individual with discussion of falls prevention and safety. Flowsheet Row Cardiac  Rehab from 06/05/2016 in [facility] Cardiac and Pulmonary Rehab  Date  05/11/16  Educator  C. [place]  Instruction Review Code  2- meets goals/outcomes      Diabetes: - Individual verbal and written instruction to review signs/symptoms of diabetes, desired ranges of glucose level fasting, after meals and with exercise. Advice that pre and post exercise glucose checks will be done for 3 sessions at entry of program. [place] from 06/05/2016 in [facility] Cardiac and Pulmonary Rehab  Date  05/11/16  Educator  C. [place]  Instruction Review Code  1- partially meets, needs review/practice       Knowledge Questionnaire Score:     Knowledge Questionnaire Score - 05/11/16 1344      Knowledge Questionnaire Score   Pre Score 22      Core Components/Risk Factors/Patient Goals at Admission:     Personal Goals and Risk Factors at Admission - 05/11/16 1428      Core Components/Risk Factors/Patient Goals on Admission   Increase Strength and Stamina Yes   Intervention Provide advice, education, support and counseling about physical activity/exercise needs.;Develop an individualized exercise prescription for aerobic and resistive training based on initial evaluation findings, risk stratification, comorbidities and participant's personal goals.   Expected Outcomes Achievement of increased cardiorespiratory fitness and enhanced flexibility, muscular endurance and strength shown through measurements of functional capacity and personal statement of participant.   Diabetes Yes   Intervention Provide education  about signs/symptoms and action to take for hypo/hyperglycemia.;Provide education about proper nutrition, including hydration, and aerobic/resistive exercise prescription along with prescribed medications to achieve blood glucose in normal ranges: Fasting glucose 65-99 mg/dL   Expected Outcomes Short Term: Participant verbalizes understanding of the signs/symptoms and immediate care  of hyper/hypoglycemia, proper foot care and importance of medication, aerobic/resistive exercise and nutrition plan for blood glucose control.;Long Term: Attainment of HbA1C < 7%.   Hypertension Yes   Intervention Provide education on lifestyle modifcations including regular physical activity/exercise, weight management, moderate sodium restriction and increased consumption of fresh fruit, vegetables, and low fat dairy, alcohol moderation, and smoking cessation.;Monitor prescription use compliance.   Expected Outcomes Short Term: Continued assessment and intervention until BP is < 140/65m HG in hypertensive participants. < 130/855mHG in hypertensive participants with diabetes, heart failure or chronic kidney disease.;Long Term: Maintenance of blood pressure at goal levels.   Lipids Yes   Intervention Provide education and support for participant on nutrition & aerobic/resistive exercise along with prescribed medications to achieve LDL <7014mHDL >43m66m Expected Outcomes Short Term: Participant states understanding of desired cholesterol values and is compliant with medications prescribed. Participant is following exercise prescription and nutrition guidelines.;Long Term: Cholesterol controlled with medications as prescribed, with individualized exercise RX and with personalized nutrition plan. Value goals: LDL < 70mg18mL > 40 mg.      Core Components/Risk Factors/Patient Goals Review:      Goals and Risk Factor Review    Row Name 05/26/16 0752             Core Components/Risk Factors/Patient Goals Review   Personal Goals Review Hypertension;Diabetes;Lipids       Review Rays BG has been within normal limits pre and post exercise so far.  He met with the RD and feels he can better put together proper portions of carbs, fats and proteins.  She also discussed low sodium diet with Gilbert Obrien.       Expected Outcomes Gilbert Obrien will continue to have BG within normal limits and see improvement in overall  health from dietary changes.          Core Components/Risk Factors/Patient Goals at Discharge (Final Review):      Goals and Risk Factor Review - 05/26/16 0752      Core Components/Risk Factors/Patient Goals Review   Personal Goals Review Hypertension;Diabetes;Lipids   Review Rays BG has been within normal limits pre and post exercise so far.  He met with the RD and feels he can better put together proper portions of carbs, fats and proteins.  She also discussed low sodium diet with Gilbert Obrien.   Expected Outcomes Gilbert Obrien will continue to have BG within normal limits and see improvement in overall health from dietary changes.      ITP Comments:     ITP Comments    Row Name 05/11/16 1346 05/11/16 1347 05/18/16 1311 06/01/16 1114 06/02/16 0848   ITP Comments Gilbert Obrien wishes to have his girlfriend who had a heart attack 4 months ago join him in Cardiac Rehab since he said he believes it will help both of them alot.  Individual appt made with the Cardiac Rehab Registered dietician since Gilbert Obrien [employer] that is one of his main concerns to eat healthier. He believes he is eating healthier not but wants to see if RD has any other suggestions. Gilbert Obrien attended a Heart Ttrack class as of 05/18/16. Gilbert Obrien has done well with exercise. Lavalle called yesterday and said he  was sorry that he couldn't attend Cardiac Rehab yesterday.    [place] Name 06/07/16 0702           ITP Comments 30 day review. Continue with ITP unless changes noted by Medical Director at signature of review.          Comments:

## 2016-06-07 NOTE — Progress Notes (Signed)
Daily Session Note  Patient Details  Name: Gilbert Obrien MRN: [id number] Date of Birth: Feb 17, 1946 Referring Provider:   Flowsheet Row Cardiac Rehab from 05/11/2016 in [facility] Cardiac and Pulmonary Rehab  Referring Provider  [name]      Encounter Date: 06/07/2016  Check In:     Session Check In - 06/07/16 1659      Check-In   Location [facility]-Cardiac & Pulmonary Rehab   Staff Present Nyoka Cowden, RN, BSN, MA;Lennix Kneisel, RN, Vickki Hearing, BA, ACSM CEP, Exercise Physiologist   Supervising physician immediately available to respond to emergencies See telemetry face sheet for immediately available ER MD   Medication changes reported     No   Fall or balance concerns reported    No   Warm-up and Cool-down Performed on first and last piece of equipment   Resistance Training Performed Yes   VAD Patient? No     Pain Assessment   Currently in Pain? No/denies         Goals Met:  No report of cardiac concerns or symptoms  Goals Unmet:  Not Applicable  Comments:     Dr. Emily Filbert is Medical Director for [place] and [facility].

## 2016-06-08 ENCOUNTER — Encounter: Payer: Commercial Managed Care - HMO | Admitting: *Deleted

## 2016-06-08 DIAGNOSIS — Z955 Presence of coronary angioplasty implant and graft: Secondary | ICD-10-CM | POA: Diagnosis not present

## 2016-06-08 NOTE — Progress Notes (Signed)
Daily Session Note  Patient Details  Name: Gilbert Obrien MRN: [id number] Date of Birth: [birth year] Referring Provider:   Flowsheet Row Cardiac Rehab from 05/11/2016 in [facility] Cardiac and Pulmonary Rehab  Referring Provider  [name]      Encounter Date: 06/08/2016  Check In:     Session Check In - 06/08/16 1655      Check-In   Location [facility]-Cardiac & Pulmonary Rehab   Staff Present Nyoka Cowden, RN, BSN, MA;Davelyn Gwinn, RN, Vickki Hearing, BA, ACSM CEP, Exercise Physiologist   Supervising physician immediately available to respond to emergencies See telemetry face sheet for immediately available ER MD   Medication changes reported     No   Fall or balance concerns reported    No   Warm-up and Cool-down Performed on first and last piece of equipment   Resistance Training Performed Yes   VAD Patient? No     Pain Assessment   Currently in Pain? No/denies         Goals Met:  Proper associated with RPD/PD & O2 Sat Exercise tolerated well  Goals Unmet:  Not Applicable  Comments:     Dr. Emily Filbert is Medical Director for [place] and [facility].

## 2016-06-12 DIAGNOSIS — H04123 Dry eye syndrome of bilateral lacrimal glands: Secondary | ICD-10-CM | POA: Diagnosis not present

## 2016-06-14 ENCOUNTER — Encounter: Payer: Commercial Managed Care - HMO | Admitting: *Deleted

## 2016-06-14 DIAGNOSIS — Z955 Presence of coronary angioplasty implant and graft: Secondary | ICD-10-CM

## 2016-06-14 NOTE — Progress Notes (Signed)
Daily Session Note  Patient Details  Name: Gilbert Obrien MRN: [id number] Date of Birth: [birth year] Referring Provider:   Flowsheet Row Cardiac Rehab from 05/11/2016 in [facility] Cardiac and Pulmonary Rehab  Referring Provider  [name]      Encounter Date: 06/14/2016  Check In:     Session Check In - 06/14/16 1635      Check-In   Location [facility]-Cardiac & Pulmonary Rehab   Staff Present Nyoka Cowden, RN, BSN, MA;Carroll Enterkin, RN, Vickki Hearing, BA, ACSM CEP, Exercise Physiologist   Supervising physician immediately available to respond to emergencies See telemetry face sheet for immediately available ER MD   Medication changes reported     No   Fall or balance concerns reported    No   Warm-up and Cool-down Performed on first and last piece of equipment   Resistance Training Performed Yes   VAD Patient? No     Pain Assessment   Currently in Pain? No/denies           Exercise Prescription Changes - 06/14/16 1400      Exercise Review   Progression Yes     Response to Exercise   Blood Pressure (Admit) 122/70   Blood Pressure (Exercise) 160/80   Blood Pressure (Exit) 138/70   Heart Rate (Admit) 61 bpm   Heart Rate (Exercise) 109 bpm   Heart Rate (Exit) 77 bpm   Symptoms none     Progression   Progression Continue to progress workloads to maintain intensity without signs/symptoms of physical distress.   Average METs 2.15     Resistance Training   Training Prescription Yes   Weight 3   Reps 10-15     Treadmill   MPH 1   Grade 0   Minutes 15   METs 1.77     Recumbant Bike   Level 1   RPM 60   Minutes 15   METs 2.7     REL-XR   Level 1   Minutes 15   METs 2      Goals Met:  Independence with exercise equipment Exercise tolerated well No report of cardiac concerns or symptoms Strength training completed today  Goals Unmet:  Not Applicable  Comments: Pt able to follow exercise prescription today without complaint.  Will continue to  monitor for progression.   Dr. Emily Filbert is Medical Director for [place] and [facility].

## 2016-06-15 ENCOUNTER — Encounter: Payer: Commercial Managed Care - HMO | Admitting: *Deleted

## 2016-06-15 DIAGNOSIS — Z955 Presence of coronary angioplasty implant and graft: Secondary | ICD-10-CM | POA: Diagnosis not present

## 2016-06-15 NOTE — Progress Notes (Signed)
Daily Session Note  Patient Details  Name: Gilbert Obrien MRN: [id number] Date of Birth: August 26, 1946 Referring Provider:   Flowsheet Row Cardiac Rehab from 05/11/2016 in [facility] Cardiac and Pulmonary Rehab  Referring Provider  [name]      Encounter Date: 06/15/2016  Check In:     Session Check In - 06/15/16 1821      Check-In   Staff Present Heath Lark, RN, BSN, Lance Sell, BA, ACSM CEP, Exercise Physiologist  Jaclynn Guarneri RN   Supervising physician immediately available to respond to emergencies See telemetry face sheet for immediately available ER MD   Medication changes reported     No   Fall or balance concerns reported    No   Warm-up and Cool-down Performed on first and last piece of equipment   Resistance Training Performed Yes   VAD Patient? No     Pain Assessment   Currently in Pain? No/denies           Exercise Prescription Changes - 06/15/16 1100      Home Exercise Plan   Plans to continue exercise at [facility] (comment)  possibly [employer] and at home walking   Frequency Add 1 additional day to program exercise sessions.      Goals Met:  Exercise tolerated well No report of cardiac concerns or symptoms Strength training completed today  Goals Unmet:  Not Applicable  Comments: Doing well with exercise prescription progression.    Dr. Emily Filbert is Medical Director for [place] and [facility].

## 2016-06-19 ENCOUNTER — Encounter: Payer: Commercial Managed Care - HMO | Admitting: *Deleted

## 2016-06-19 DIAGNOSIS — Z955 Presence of coronary angioplasty implant and graft: Secondary | ICD-10-CM

## 2016-06-19 NOTE — Progress Notes (Signed)
Daily Session Note  Patient Details  Name: Gilbert Obrien MRN: [id number] Date of Birth: Jan 27, 1946 Referring Provider:   Flowsheet Row Cardiac Rehab from 05/11/2016 in [facility] Cardiac and Pulmonary Rehab  Referring Provider  [name]      Encounter Date: 06/19/2016  Check In:     Session Check In - 06/19/16 1626      Check-In   Location [facility]-Cardiac & Pulmonary Rehab   Staff Present Heath Lark, RN, BSN, CCRP;Carroll Enterkin, RN, Moises Blood, BS, ACSM CEP, Exercise Physiologist   Supervising physician immediately available to respond to emergencies See telemetry face sheet for immediately available ER MD   Medication changes reported     No   Fall or balance concerns reported    No   Warm-up and Cool-down Performed on first and last piece of equipment   Resistance Training Performed Yes   VAD Patient? No     Pain Assessment   Currently in Pain? No/denies         Goals Met:  Independence with exercise equipment Exercise tolerated well No report of cardiac concerns or symptoms Strength training completed today  Goals Unmet:  Not Applicable  Comments: Doing well with exercise prescription progression.    Dr. Emily Filbert is Medical Director for [place] and [facility].

## 2016-06-21 ENCOUNTER — Encounter: Payer: Commercial Managed Care - HMO | Admitting: *Deleted

## 2016-06-21 DIAGNOSIS — Z955 Presence of coronary angioplasty implant and graft: Secondary | ICD-10-CM

## 2016-06-21 NOTE — Progress Notes (Signed)
Daily Session Note  Patient Details  Name: ARISTEO HANKERSON MRN: [id number] Date of Birth: September 25, 1946 Referring Provider:   Flowsheet Row Cardiac Rehab from 05/11/2016 in [facility] Cardiac and Pulmonary Rehab  Referring Provider  [name]      Encounter Date: 06/21/2016  Check In:     Session Check In - 06/21/16 1700      Check-In   Staff Present Heath Lark, RN, BSN, CCRP;Carroll Enterkin, RN, Vickki Hearing, BA, ACSM CEP, Exercise Physiologist   Supervising physician immediately available to respond to emergencies See telemetry face sheet for immediately available ER MD   Medication changes reported     No   Fall or balance concerns reported    No   Warm-up and Cool-down Performed on first and last piece of equipment   Resistance Training Performed Yes   VAD Patient? No     Pain Assessment   Currently in Pain? No/denies         Goals Met:  Independence with exercise equipment Exercise tolerated well No report of cardiac concerns or symptoms Strength training completed today  Goals Unmet:  Not Applicable  Comments: Doing well with exercise prescription progression.    Dr. Emily Filbert is Medical Director for [place] and [facility].

## 2016-06-22 DIAGNOSIS — Z955 Presence of coronary angioplasty implant and graft: Secondary | ICD-10-CM

## 2016-06-22 NOTE — Progress Notes (Signed)
Daily Session Note  Patient Details  Name: Gilbert Obrien MRN: [id number] Date of Birth: 09/27/1945 Referring Provider:   Flowsheet Row Cardiac Rehab from 05/11/2016 in [facility] Cardiac and Pulmonary Rehab  Referring Provider  [place]      Encounter Date: 06/22/2016  Check In:     Session Check In - 06/22/16 1729      Check-In   Location [facility]-Cardiac & Pulmonary Rehab   Staff Present Susanne Bice, RN, BSN, CCRP;Laureen Brown, BS, RRT, Respiratory Therapist;Amanda Sommer, BA, ACSM CEP, Exercise Physiologist   Medication changes reported     No   Fall or balance concerns reported    No   Warm-up and Cool-down Performed on first and last piece of equipment   Resistance Training Performed Yes   VAD Patient? No     Pain Assessment   Currently in Pain? No/denies   Multiple Pain Sites No         Goals Met:  Independence with exercise equipment Exercise tolerated well No report of cardiac concerns or symptoms Strength training completed today  Goals Unmet:  Not Applicable  Comments: Pt able to follow exercise prescription today without complaint.  Will continue to monitor for progression.    Dr. Mark Miller is Medical Director for [facility] and [facility]. 

## 2016-06-26 ENCOUNTER — Encounter: Payer: Commercial Managed Care - HMO | Attending: Cardiovascular Disease

## 2016-06-26 DIAGNOSIS — Z955 Presence of coronary angioplasty implant and graft: Secondary | ICD-10-CM | POA: Diagnosis not present

## 2016-06-26 NOTE — Progress Notes (Signed)
Daily Session Note  Patient Details  Name: Gilbert Obrien MRN: [id number] Date of Birth: 09/20/1946 Referring Provider:   Flowsheet Row Cardiac Rehab from 05/11/2016 in [facility] Cardiac and Pulmonary Rehab  Referring Provider  [name]      Encounter Date: 06/26/2016  Check In:     Session Check In - 06/26/16 1642      Check-In   Location [facility]-Cardiac & Pulmonary Rehab   Staff Present Heath Lark, RN, BSN, Laveda Norman, BS, ACSM CEP, Exercise Physiologist;Amanda Oletta Darter, [place], ACSM CEP, Exercise Physiologist   Supervising physician immediately available to respond to emergencies See telemetry face sheet for immediately available ER MD   Medication changes reported     No   Fall or balance concerns reported    No   Warm-up and Cool-down Performed on first and last piece of equipment   Resistance Training Performed Yes   VAD Patient? No     Pain Assessment   Currently in Pain? No/denies   Multiple Pain Sites No         Goals Met:  Independence with exercise equipment Exercise tolerated well No report of cardiac concerns or symptoms Strength training completed today  Goals Unmet:  Not Applicable  Comments: Pt able to follow exercise prescription today without complaint.  Will continue to monitor for progression.    Dr. Emily Filbert is Medical Director for [place] and [facility].

## 2016-06-29 ENCOUNTER — Encounter: Payer: Commercial Managed Care - HMO | Admitting: *Deleted

## 2016-06-29 DIAGNOSIS — Z955 Presence of coronary angioplasty implant and graft: Secondary | ICD-10-CM

## 2016-06-29 NOTE — Progress Notes (Signed)
Daily Session Note  Patient Details  Name: BRAYLYNN GHAN MRN: [id number] Date of Birth: 09-06-1946 Referring Provider:   Flowsheet Row Cardiac Rehab from 05/11/2016 in [facility] Cardiac and Pulmonary Rehab  Referring Provider  [name]      Encounter Date: 06/29/2016  Check In:     Session Check In - 06/29/16 1733      Check-In   Location [facility]-Cardiac & Pulmonary Rehab   Staff Present Alberteen Sam, MA, ACSM RCEP, Exercise Physiologist;Amanda Oletta Darter, BA, ACSM CEP, Exercise Physiologist;Carroll Enterkin, RN, BSN   Supervising physician immediately available to respond to emergencies See telemetry face sheet for immediately available ER MD   Medication changes reported     No   Fall or balance concerns reported    No   Warm-up and Cool-down Performed on first and last piece of equipment   Resistance Training Performed Yes   VAD Patient? No     Pain Assessment   Currently in Pain? No/denies   Multiple Pain Sites No         Goals Met:  Independence with exercise equipment Exercise tolerated well No report of cardiac concerns or symptoms Strength training completed today  Goals Unmet:  Not Applicable  Comments: Pt able to follow exercise prescription today without complaint.  Will continue to monitor for progression.   Dr. Emily Filbert is Medical Director for [place] and [facility].

## 2016-07-03 ENCOUNTER — Encounter: Payer: Commercial Managed Care - HMO | Admitting: *Deleted

## 2016-07-03 DIAGNOSIS — Z955 Presence of coronary angioplasty implant and graft: Secondary | ICD-10-CM | POA: Diagnosis not present

## 2016-07-03 NOTE — Progress Notes (Signed)
Daily Session Note  Patient Details  Name: Gilbert Obrien MRN: [id number] Date of Birth: [birth year] Referring Provider:   Flowsheet Row Cardiac Rehab from 05/11/2016 in [facility] Cardiac and Pulmonary Rehab  Referring Provider  [name]      Encounter Date: 07/03/2016  Check In:     Session Check In - 07/03/16 1828      Check-In   Staff Present Heath Lark, RN, BSN, CCRP;Carroll Enterkin, RN, Moises Blood, BS, ACSM CEP, Exercise Physiologist   Supervising physician immediately available to respond to emergencies See telemetry face sheet for immediately available ER MD   Medication changes reported     No   Warm-up and Cool-down Performed on first and last piece of equipment   Resistance Training Performed Yes   VAD Patient? No     Pain Assessment   Currently in Pain? No/denies         Goals Met:  Exercise tolerated well No report of cardiac concerns or symptoms Strength training completed today  Goals Unmet:  Not Applicable  Comments: Doing well with exercise prescription progression.     Dr. Emily Filbert is Medical Director for [place] and [facility].

## 2016-07-05 ENCOUNTER — Encounter: Payer: Commercial Managed Care - HMO | Admitting: *Deleted

## 2016-07-05 ENCOUNTER — Encounter: Payer: Self-pay | Admitting: *Deleted

## 2016-07-05 DIAGNOSIS — Z955 Presence of coronary angioplasty implant and graft: Secondary | ICD-10-CM | POA: Diagnosis not present

## 2016-07-05 NOTE — Progress Notes (Signed)
Cardiac Individual Treatment Plan  Patient Details  Name: Gilbert Obrien MRN: [id number] Date of Birth: 09/11/1946 Referring Provider:   Flowsheet Row Cardiac Rehab from 05/11/2016 in [facility] Cardiac and Pulmonary Rehab  Referring Provider  [name]      Initial Encounter Date:  Flowsheet Row Cardiac Rehab from 05/11/2016 in [facility] Cardiac and Pulmonary Rehab  Date  05/11/16  Referring Provider  [name]      Visit Diagnosis: S/P coronary artery stent placement  Patient's Home Medications on Admission:  Current Outpatient Prescriptions:  .  amLODipine (NORVASC) 10 MG tablet, Take 1 tablet (10 mg total) by mouth daily., Disp: 30 tablet, Rfl: 12 .  aspirin EC 81 MG EC tablet, Take 1 tablet (81 mg total) by mouth daily., Disp: , Rfl:  .  atorvastatin (LIPITOR) 80 MG tablet, Take 1 tablet (80 mg total) by mouth daily at 6 PM., Disp: 30 tablet, Rfl: 0 .  metoprolol tartrate (LOPRESSOR) 25 MG tablet, Take 1 tablet (25 mg total) by mouth 2 (two) times daily., Disp: 60 tablet, Rfl: 0 .  Multiple Vitamin (MULTIVITAMIN) tablet, Take 1 tablet by mouth daily., Disp: , Rfl:  .  nitroGLYCERIN (NITROSTAT) 0.4 MG SL tablet, Place 1 tablet (0.4 mg total) under the tongue every 5 (five) minutes x 3 doses as needed for chest pain., Disp: 25 tablet, Rfl: 1 .  nitroGLYCERIN (NITROSTAT) 0.4 MG SL tablet, Place 0.4 mg under the tongue every 5 (five) minutes as needed for chest pain., Disp: , Rfl:  .  Potassium (POTASSIMIN PO), Take 99 mg by mouth daily., Disp: , Rfl:  .  ramipril (ALTACE) 2.5 MG capsule, Take 1 capsule (2.5 mg total) by mouth daily., Disp: 30 capsule, Rfl: 0 .  temazepam (RESTORIL) 15 MG capsule, Take 15 mg by mouth at bedtime as needed for sleep., Disp: , Rfl:  .  ticagrelor (BRILINTA) 90 MG TABS tablet, Take 1 tablet (90 mg total) by mouth 2 (two) times daily., Disp: 60 tablet, Rfl: 0 .  traMADol (ULTRAM) 50 MG tablet, Take 50 mg by mouth every 6 (six) hours as needed for moderate pain., Disp: ,  Rfl:   Past Medical History: Past Medical History:  Diagnosis Date  . Arthritis   . Coronary artery disease    a. STEMI 03/2016 DESx1 to LCx, DES x 1 Mid LAD, DES x1 prox RCA  . Depression   . Diabetes mellitus without complication ([place])    type 2  . History of acute inferior wall myocardial infarction 04/15/2016   inf-lat/post >> PCI with DES of LCx; staged PCI of LAD and RCA  . Hypercholesteremia   . Hypertension   . Ischemic cardiomyopathy 04/19/2016   A. Inf-lat/post STEMI 7/17 >> b. Echo 04/17/16: Septal and post lateral HK, poor image quality, EF 35-40%    Tobacco Use: History  Smoking Status  . Former Smoker  . Quit date: 09/25/2000  Smokeless Tobacco  . Never Used    Labs: Recent Review Flowsheet Data    Labs for ITP Cardiac and Pulmonary Rehab Latest Ref Rng & Units 04/15/2016 04/16/2016   Cholestrol 0 - 200 mg/dL 222(H) 175   LDLCALC 0 - 99 mg/dL 146(H) 101(H)   HDL >40 mg/dL 41 32(L)   Trlycerides <150 mg/dL 177(H) 209(H)   Hemoglobin A1c 4.8 - 5.6 % 7.1(H) -       Exercise Target Goals:    Exercise Program Goal: Individual exercise prescription set with THRR, safety & activity barriers. Participant demonstrates ability  to understand and report RPE using BORG scale, to self-measure pulse accurately, and to acknowledge the importance of the exercise prescription.  Exercise Prescription Goal: Starting with aerobic activity 30 plus minutes a day, 3 days per week for initial exercise prescription. Provide home exercise prescription and guidelines that participant acknowledges understanding prior to discharge.  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 HR 118 bpm     Max Ex. BP 134/60        Initial Exercise Prescription:     Initial Exercise Prescription - 05/11/16 1400      Date of Initial Exercise RX and Referring Provider   Date 05/11/16   Referring Provider [name]     Treadmill   MPH 1.6   Grade 0.5   Minutes 15   METs 2.25     Recumbant Bike   Level 1   RPM 60   Minutes 15   METs 2     NuStep   Level 3   Minutes 15   METs 2     T5 Nustep   Level 2   Minutes 15   METs 2     Prescription Details   Frequency (times per week) 3   Duration Progress to 45 minutes of aerobic exercise without signs/symptoms of physical distress     Intensity   THRR 40-80% of Max Heartrate 112-137   Ratings of Perceived Exertion 11-13     Progression   Progression Continue to progress workloads to maintain intensity without signs/symptoms of physical distress.     Resistance Training   Training Prescription Yes   Weight 3   Reps 10-15      Perform Capillary Blood Glucose checks as needed.  Exercise Prescription Changes:     Exercise Prescription Changes    Row Name 06/01/16 1100 06/14/16 1400 06/15/16 1100 06/28/16 1300       Exercise Review   Progression Yes Yes  - Yes      Response to Exercise   Blood Pressure (Admit) 122/66 122/70  - 138/64    Blood Pressure (Exercise) 148/62 160/80  - 162/84    Blood Pressure (Exit) 132/70 138/70  - 132/70    Heart Rate (Admit) 68 bpm 61 bpm  - 106 bpm    Heart Rate (Exercise) 97 bpm 109 bpm  - 103 bpm    Heart Rate (Exit) 55 bpm 77 bpm  - 83 bpm    Rating of Perceived Exertion (Exercise)  -  -  - 11    Symptoms none none  -  -    Duration  -  -  - Progress to 45 minutes of aerobic exercise without signs/symptoms of physical  distress    Intensity  -  -  - THRR unchanged      Progression    Progression Continue to progress workloads to maintain intensity without signs/symptoms of physical distress. Continue to progress workloads to maintain intensity without signs/symptoms of physical distress.  - Continue to progress workloads to maintain intensity without signs/symptoms of physical distress.    Average METs 2.87 2.15  - 3      Resistance Training   Training Prescription Yes Yes  - Yes    Weight 3 3  - 5    Reps 10-15 10-15  - 10-15      Interval Training   Interval Training No  -  - No      Treadmill   MPH  - 1  - 2    Grade  - 0  - 1    Minutes  - 15  - 15    METs  - 1.77  - 2.81      Recumbant Bike   Level 1 1  - 3    RPM 60 60  - 60    Minutes 15 15  - 15    METs 3.14 2.7  - 3.3      REL-XR   Level 1 1  -  -    Minutes 15 15  -  -    METs 2.6 2  -  -      Home Exercise Plan   Plans to continue exercise at  -  - [employer] (comment)  possibly [employer] and at home walking  -    Frequency  -  - Add 1 additional day to program exercise sessions.  -       Exercise Comments:     Exercise Comments    Row Name 05/18/16 1311 06/01/16 1113 06/14/16 1428 06/15/16 1110 06/28/16 1315   Exercise Comments Mr Brinegar has not attended a Heart Ttrack class as of 05/18/16. Ray has done well with exercise. Mr Edelson is progressing well with exercise. Ray plans to walk at home and may join the [facility] to continue exercise. Ray is progressing very well with exercise by adding intensity to strength and cardio exercise.      Discharge Exercise Prescription (Final Exercise Prescription Changes):     Exercise Prescription Changes - 06/28/16 1300      Exercise Review   Progression Yes     Response to Exercise   Blood Pressure (Admit) 138/64   Blood Pressure (Exercise) 162/84   Blood Pressure (Exit) 132/70   Heart Rate (Admit) 106 bpm   Heart Rate (Exercise) 103 bpm   Heart Rate (Exit) 83 bpm   Rating of Perceived Exertion (Exercise) 11   Duration Progress to 45  minutes of aerobic exercise without signs/symptoms of physical distress   Intensity THRR unchanged     Progression   Progression Continue to progress workloads to maintain intensity without signs/symptoms of physical distress.   Average METs 3     Resistance Training   Training Prescription Yes   Weight 5   Reps 10-15     Interval Training   Interval Training No     Treadmill   MPH 2   Grade 1   Minutes 15   METs 2.81     Recumbant Bike   Level 3   RPM 60   Minutes 15   METs 3.3      Nutrition:  Target Goals: Understanding of nutrition guidelines, daily  intake of sodium '1500mg'$ , cholesterol '200mg'$ , calories 30% from fat and 7% or less from saturated fats, daily to have 5 or more servings of fruits and vegetables.  Biometrics:     Pre Biometrics - 05/11/16 1408      Pre Biometrics   Height '5\' 5"'$  (1.651 m)   Weight 177 lb 14.4 oz (80.7 kg)   Waist Circumference 43.25 inches   Hip Circumference 39.25 inches   Waist to Hip Ratio 1.1 %   BMI (Calculated) 29.7   Single Leg Stand 8.33 seconds       Nutrition Therapy Plan and Nutrition Goals:     Nutrition Therapy & Goals - 05/24/16 1642      Nutrition Therapy   Diet 1600kcal basic heart healthy diet   Protein (specify units) 7oz   Fiber 25 grams   Whole Grain Foods 3 servings   Saturated Fats 12 max. grams   Fruits and Vegetables 5 servings/day   Sodium 1500 grams     Personal Nutrition Goals   Personal Goal #1 Use menus (provided) for more meal ideas, to add variety of heart healthy options   Comments Patient is working to [employer] for himself; he states he needs more ideas of what he can eat. Provided sample menus for 2 weeks of ideas.      Intervention Plan   Intervention Prescribe, educate and counsel regarding individualized specific dietary modifications aiming towards targeted core components such as weight, hypertension, lipid management, diabetes, heart failure and other comorbidities.;Nutrition  handout(s) given to patient.   Expected Outcomes Short Term Goal: Understand basic principles of dietary content, such as calories, fat, sodium, cholesterol and nutrients.;Short Term Goal: A plan has been developed with personal nutrition goals set during dietitian appointment.;Long Term Goal: Adherence to prescribed nutrition plan.      Nutrition Discharge: Rate Your Plate Scores:     Nutrition Assessments - 05/11/16 1427      Rate Your Plate Scores   Pre Score 73   Pre Score % 81.1 %      Nutrition Goals Re-Evaluation:     Nutrition Goals Re-Evaluation    Row Name 06/19/16 1707             Personal Goal #1 Re-Evaluation   Personal Goal #1 Use menus (provided) for more meal ideas, to add variety of heart healthy options       Goal Progress Seen Yes       Comments Tried some of the menus.   Still working on label reading and eating healthier   Goal to continue to work on the meal planning.           Psychosocial: Target Goals: Acknowledge presence or absence of depression, maximize coping skills, provide positive support system. Participant is able to verbalize types and ability to use techniques and skills needed for reducing stress and depression.  Initial Review & Psychosocial Screening:     Initial Psych Review & Screening - 05/11/16 1429      Initial Review   Current issues with Current Stress Concerns   Comments Ray reports he may have to have surgery on his hematoma of his arm since they have drained it many times and it keeps returning. Ray has a friend and she has diabetes and angina and he said it would help both of them to be in [place].      Family Dynamics   Good Support System? Yes     Barriers  Psychosocial barriers to participate in program The patient should benefit from training in stress management and relaxation.     Screening Interventions   Interventions Encouraged to exercise;Program counselor consult      Quality of Life Scores:      Quality of Life - 05/11/16 1427      Quality of Life Scores   Health/Function Pre 11.6 %   Socioeconomic Pre 20.71 %   Psych/Spiritual Pre 10.29 %   Family Pre 26.4 %   GLOBAL Pre 15.38 %      PHQ-9: Recent Review Flowsheet Data    Depression screen [facility] 2/9 05/11/2016   Decreased Interest 1   Down, Depressed, Hopeless 1   PHQ - 2 Score 2   Altered sleeping 1   Tired, decreased energy 1   Change in appetite 0   Feeling bad or failure about yourself  0   Trouble concentrating 0   Moving slowly or fidgety/restless 0   Suicidal thoughts 0   PHQ-9 Score 4   Difficult doing work/chores Somewhat difficult      Psychosocial Evaluation and Intervention:     Psychosocial Evaluation - 06/05/16 1725      Psychosocial Evaluation & Interventions   Interventions Encouraged to exercise with the program and follow exercise prescription   Comments Counselor met with Mr. Mechling today for initial psychosocial evaluation.  He is a 70 year old who had a heart attack this past July.  Mr. Rexene Edison states he has a girlfriend locally who is his primary support person.  all other family live out of town.  He reports having bad shoulders & knees along with back problems  in addition to his cardiac issues.  Mr. Rexene Edison does not sleep well and has medications that he takes PRN to help with this.  His appetite has improved since the surgery per his report.  Mr. Rexene Edison admits to having a history of depression earlier in his life and occasionally has symptoms possibly 2/7 days per week.  He states his mood is "stable" at this time and his PHQ-9 score was only "4."  Counselor recommended that if consistent exercise and taking the sleep medication more often does not improve the current mood and depressive symptoms that Mr. Rexene Edison should consult with his Dr. about this.  He agreed to do so and states he has an appointment already scheduled for later this month.  Mr. Rexene Edison has some stress being on a fixed income and with his current  health issues as well.  He has goals to increase his stamina and strength while in this program.  Counselor will continue to follow with Mr. Vignola throughout this Cardiac Rehab Program.     Continued Psychosocial Services Needed Yes  Follow up on whether sleep and exercise improve mood symptoms or consult with Dr.  Rock Nephew. agreed.        Psychosocial Re-Evaluation:   Vocational Rehabilitation: Provide vocational rehab assistance to qualifying candidates.   Vocational Rehab Evaluation & Intervention:     Vocational Rehab - 05/11/16 1345      Initial Vocational Rehab Evaluation & Intervention   Assessment shows need for Vocational Rehabilitation No      Education: Education Goals: Education classes will be provided on a weekly basis, covering required topics. Participant will state understanding/return demonstration of topics presented.  Learning Barriers/Preferences:     Learning Barriers/Preferences - 05/11/16 1344      Learning Barriers/Preferences   Learning Barriers None   Learning Preferences Group  Instruction      Education Topics: General Nutrition Guidelines/Fats and Fiber: -Group instruction provided by verbal, written material, models and posters to present the general guidelines for heart healthy nutrition. Gives an explanation and review of dietary fats and fiber.   Controlling Sodium/Reading Food Labels: -Group verbal and written material supporting the discussion of sodium use in heart healthy nutrition. Review and explanation with models, verbal and written materials for utilization of the food label. Flowsheet Row Cardiac Rehab from 07/03/2016 in [facility] Cardiac and Pulmonary Rehab  Date  06/19/16  Educator  PI  Instruction Review Code  2- meets goals/outcomes      Exercise Physiology & Risk Factors: - Group verbal and written instruction with models to review the exercise physiology of the cardiovascular system and associated critical values. Details  cardiovascular disease risk factors and the goals associated with each risk factor. Flowsheet Row Cardiac Rehab from 07/03/2016 in [facility] Cardiac and Pulmonary Rehab  Date  06/26/16  Educator  [facility]  Instruction Review Code  2- meets goals/outcomes      Aerobic Exercise & Resistance Training: - Gives group verbal and written discussion on the health impact of inactivity. On the components of aerobic and resistive training programs and the benefits of this training and how to safely progress through these programs. Flowsheet Row Cardiac Rehab from 07/03/2016 in [facility] Cardiac and Pulmonary Rehab  Date  07/03/16  Educator  [facility]  Instruction Review Code  2- meets goals/outcomes      Flexibility, Balance, General Exercise Guidelines: - Provides group verbal and written instruction on the benefits of flexibility and balance training programs. Provides general exercise guidelines with specific guidelines to those with heart or lung disease. Demonstration and skill practice provided.   Stress Management: - Provides group verbal and written instruction about the health risks of elevated stress, cause of high stress, and healthy ways to reduce stress.   Depression: - Provides group verbal and written instruction on the correlation between heart/lung disease and depressed mood, treatment options, and the stigmas associated with seeking treatment. Flowsheet Row Cardiac Rehab from 07/03/2016 in [facility] Cardiac and Pulmonary Rehab  Date  06/14/16  Educator  [facility]  Instruction Review Code  2- meets goals/outcomes      Anatomy & Physiology of the Heart: - Group verbal and written instruction and models provide basic cardiac anatomy and physiology, with the coronary electrical and arterial systems. Review of: AMI, Angina, Valve disease, Heart Failure, Cardiac Arrhythmia, Pacemakers, and the ICD.   Cardiac Procedures: - Group verbal and written instruction and models to describe the testing methods done to  diagnose heart disease. Reviews the outcomes of the test results. Describes the treatment choices: Medical Management, Angioplasty, or Coronary Bypass Surgery. Flowsheet Row Cardiac Rehab from 07/03/2016 in [facility] Cardiac and Pulmonary Rehab  Date  05/22/16  Educator  [facility]  Instruction Review Code  2- meets goals/outcomes      Cardiac Medications: - Group verbal and written instruction to review commonly prescribed medications for heart disease. Reviews the medication, class of the drug, and side effects. Includes the steps to properly store meds and maintain the prescription regimen. Flowsheet Row Cardiac Rehab from 07/03/2016 in [facility] Cardiac and Pulmonary Rehab  Date  05/31/16  Educator  SB  Instruction Review Code  2- meets goals/outcomes      Go Sex-Intimacy & Heart Disease, Get SMART - Goal Setting: - Group verbal and written instruction through game format to discuss heart disease and the return to  sexual intimacy. Provides group verbal and written material to discuss and apply goal setting through the application of the S.M.A.R.T. Method. Flowsheet Row Cardiac Rehab from 07/03/2016 in [facility] Cardiac and Pulmonary Rehab  Date  05/22/16  Educator  MJA  Instruction Review Code  2- meets goals/outcomes      Other Matters of the Heart: - Provides group verbal, written materials and models to describe Heart Failure, Angina, Valve Disease, and Diabetes in the realm of heart disease. Includes description of the disease process and treatment options available to the cardiac patient.   Exercise & Equipment Safety: - Individual verbal instruction and demonstration of equipment use and safety with use of the equipment. Flowsheet Row Cardiac Rehab from 07/03/2016 in [facility] Cardiac and Pulmonary Rehab  Date  05/11/16  Educator  C. EnterkinRN  Instruction Review Code  1- partially meets, needs review/practice      Infection Prevention: - Provides verbal and written material to individual with  discussion of infection control including proper hand washing and proper equipment cleaning during exercise session. Flowsheet Row Cardiac Rehab from 07/03/2016 in [facility] Cardiac and Pulmonary Rehab  Date  05/11/16  Educator  C. EnterkinRN  Instruction Review Code  2- meets goals/outcomes      Falls Prevention: - Provides verbal and written material to individual with discussion of falls prevention and safety. Flowsheet Row Cardiac Rehab from 07/03/2016 in [facility] Cardiac and Pulmonary Rehab  Date  05/11/16  Educator  C. EnterkinRN  Instruction Review Code  2- meets goals/outcomes      Diabetes: - Individual verbal and written instruction to review signs/symptoms of diabetes, desired ranges of glucose level fasting, after meals and with exercise. Advice that pre and post exercise glucose checks will be done for 3 sessions at entry of program. Flowsheet Row Cardiac Rehab from 07/03/2016 in [facility] Cardiac and Pulmonary Rehab  Date  05/11/16  Educator  C. EnterkinRN  Instruction Review Code  1- partially meets, needs review/practice       Knowledge Questionnaire Score:     Knowledge Questionnaire Score - 05/11/16 1344      Knowledge Questionnaire Score   Pre Score 22      Core Components/Risk Factors/Patient Goals at Admission:     Personal Goals and Risk Factors at Admission - 05/11/16 1428      Core Components/Risk Factors/Patient Goals on Admission   Increase Strength and Stamina Yes   Intervention Provide advice, education, support and counseling about physical activity/exercise needs.;Develop an individualized exercise prescription for aerobic and resistive training based on initial evaluation findings, risk stratification, comorbidities and participant's personal goals.   Expected Outcomes Achievement of increased cardiorespiratory fitness and enhanced flexibility, muscular endurance and strength shown through measurements of functional capacity and personal statement of  participant.   Diabetes Yes   Intervention Provide education about signs/symptoms and action to take for hypo/hyperglycemia.;Provide education about proper nutrition, including hydration, and aerobic/resistive exercise prescription along with prescribed medications to achieve blood glucose in normal ranges: Fasting glucose 65-99 mg/dL   Expected Outcomes Short Term: Participant verbalizes understanding of the signs/symptoms and immediate care of hyper/hypoglycemia, proper foot care and importance of medication, aerobic/resistive exercise and nutrition plan for blood glucose control.;Long Term: Attainment of HbA1C < 7%.   Hypertension Yes   Intervention Provide education on lifestyle modifcations including regular physical activity/exercise, weight management, moderate sodium restriction and increased consumption of fresh fruit, vegetables, and low fat dairy, alcohol moderation, and smoking cessation.;Monitor prescription use compliance.   Expected Outcomes  Short Term: Continued assessment and intervention until BP is < 140/41m HG in hypertensive participants. < 130/848mHG in hypertensive participants with diabetes, heart failure or chronic kidney disease.;Long Term: Maintenance of blood pressure at goal levels.   Lipids Yes   Intervention Provide education and support for participant on nutrition & aerobic/resistive exercise along with prescribed medications to achieve LDL '70mg'$ , HDL >'40mg'$ .   Expected Outcomes Short Term: Participant states understanding of desired cholesterol values and is compliant with medications prescribed. Participant is following exercise prescription and nutrition guidelines.;Long Term: Cholesterol controlled with medications as prescribed, with individualized exercise RX and with personalized nutrition plan. Value goals: LDL < '70mg'$ , HDL > 40 mg.      Core Components/Risk Factors/Patient Goals Review:      Goals and Risk Factor Review    Row Name 05/26/16 0752 06/16/16  1229           Core Components/Risk Factors/Patient Goals Review   Personal Goals Review Hypertension;Diabetes;Lipids Diabetes;Hypertension;Lipids      Review Rays BG has been within normal limits pre and post exercise so far.  He met with the RD and feels he can better put together proper portions of carbs, fats and proteins.  She also discussed low sodium diet with Ray. Ray does not monitor his BG at home.  He was not clear if he is diabetic or pre diabetic.  SuCollie Siadill follow up and talk to RaMingus He is taking all meds as directed for HTN and lipids.        Expected Outcomes Ray will continue to have BG within normal limits and see improvement in overall health from dietary changes. Ray will confirm his status with BG with his Dr. and continue to take meds as directed.  He will continue to keep his risk factors under control and reduce the risk of further cardiovascular events.         Core Components/Risk Factors/Patient Goals at Discharge (Final Review):      Goals and Risk Factor Review - 06/16/16 1229      Core Components/Risk Factors/Patient Goals Review   Personal Goals Review Diabetes;Hypertension;Lipids   Review Ray does not monitor his BG at home.  He was not clear if he is diabetic or pre diabetic.  SuCollie Siadill follow up and talk to RaTonawanda He is taking all meds as directed for HTN and lipids.     Expected Outcomes Ray will confirm his status with BG with his Dr. and continue to take meds as directed.  He will continue to keep his risk factors under control and reduce the risk of further cardiovascular events.      ITP Comments:     ITP Comments    Row Name 05/11/16 1346 05/11/16 1347 05/18/16 1311 06/01/16 1114 06/02/16 0848   ITP Comments Ray wishes to have his girlfriend who had a heart attack 4 months ago join him in Cardiac Rehab since he said he believes it will help both of them alot.  Individual appt made with the Cardiac Rehab Registered dietician since [place] that is one  of his main concerns to eat healthier. He believes he is eating healthier not but wants to see if RD has any other suggestions. Mr HeDiekmanas not attended a Heart Ttrack class as of 05/18/16. Ray has done well with exercise. RaAsharalled yesterday and said he was sorry that he couldn't attend Cardiac Rehab yesterday.    RoHarperame 06/07/16 [id number]/11/17 0700  ITP Comments 30 day review. Continue with ITP unless changes noted by Medical Director at signature of review. 30 day review. Continue with ITP unless changes noted by Medical Director at signature of review.          Comments:

## 2016-07-05 NOTE — Progress Notes (Signed)
Daily Session Note  Patient Details  Name: LYNARD POSTLEWAIT MRN: [id number] Date of Birth: 1945/11/02 Referring Provider:   Flowsheet Row Cardiac Rehab from 05/11/2016 in [facility] Cardiac and Pulmonary Rehab  Referring Provider  [name]      Encounter Date: 07/05/2016  Check In:     Session Check In - 07/05/16 6389      Check-In   Location [facility]-Cardiac & Pulmonary Rehab   Staff Present Nyoka Cowden, RN, BSN, MA;Susanne Bice, RN, BSN, CCRP;Rabecka Brendel, RN, BSN   Supervising physician immediately available to respond to emergencies See telemetry face sheet for immediately available ER MD   Medication changes reported     No   Fall or balance concerns reported    No   Warm-up and Cool-down Performed on first and last piece of equipment   Resistance Training Performed Yes   VAD Patient? No     Pain Assessment   Currently in Pain? No/denies         Goals Met:  Proper associated with RPD/PD & O2 Sat Exercise tolerated well  Goals Unmet:  Not Applicable  Comments:    Dr. Emily Filbert is Medical Director for [place] and [facility].

## 2016-07-06 DIAGNOSIS — Z955 Presence of coronary angioplasty implant and graft: Secondary | ICD-10-CM | POA: Diagnosis not present

## 2016-07-06 NOTE — Progress Notes (Signed)
Daily Session Note  Patient Details  Name: ELISAH PARMER MRN: [id number] Date of Birth: Apr 10, 1946 Referring Provider:   Flowsheet Row Cardiac Rehab from 05/11/2016 in [facility] Cardiac and Pulmonary Rehab  Referring Provider  [name]      Encounter Date: 07/06/2016  Check In:     Session Check In - 07/06/16 1652      Check-In   Location [facility]-Cardiac & Pulmonary Rehab   Staff Present Jeanell Sparrow, DPT, Burlene Arnt, BA, ACSM CEP, Exercise Physiologist;Other   Supervising physician immediately available to respond to emergencies See telemetry face sheet for immediately available ER MD   Medication changes reported     No   Fall or balance concerns reported    No   Warm-up and Cool-down Performed on first and last piece of equipment   Resistance Training Performed Yes   VAD Patient? No     Pain Assessment   Currently in Pain? No/denies         Goals Met:  Independence with exercise equipment  Goals Unmet:  Not Applicable  Comments: Patient completed exercise prescription and all exercise goals during rehab session. The exercise was tolerated well and the patient is progressing in the program.    Dr. Emily Filbert is Medical Director for [place] and [facility].

## 2016-07-13 ENCOUNTER — Encounter: Payer: Self-pay | Admitting: Cardiovascular Disease

## 2016-07-16 ENCOUNTER — Telehealth: Payer: Self-pay | Admitting: Cardiology

## 2016-07-16 NOTE — Telephone Encounter (Signed)
Patient called in today with complaints of LE edema for the past few days.  He says at night they go down and then swell during the day.  He had some SOB the other night but since then has not.  He ate out and ate a subway club over 2 days.  Suspect due to dietary indiscretion with sodium.  I have counseled him to avoid deli meats and food high in sodium.  I have also instructed him to call tomorrow for an appt to be seen.  He agrees to call in am or sooner if symptoms worsen.

## 2016-07-17 ENCOUNTER — Telehealth: Payer: Self-pay | Admitting: Cardiovascular Disease

## 2016-07-17 ENCOUNTER — Encounter: Payer: Commercial Managed Care - HMO | Admitting: *Deleted

## 2016-07-17 DIAGNOSIS — Z955 Presence of coronary angioplasty implant and graft: Secondary | ICD-10-CM

## 2016-07-17 NOTE — Telephone Encounter (Signed)
Pt had heart attack 04-15-16, swelling in both legs, had some salty foods they may have contributed to the swelling, from knee to ankle, denies any other symptoms , called after hours last night, told to call office pls call [phone] ,pt has cardiac rehab at 400pm today, wants to know if he should go

## 2016-07-17 NOTE — Progress Notes (Signed)
Daily Session Note  Patient Details  Name: Gilbert Obrien MRN: [id number] Date of Birth: October 16, 1945 Referring Provider:   Flowsheet Row Cardiac Rehab from 05/11/2016 in [facility] Cardiac and Pulmonary Rehab  Referring Provider  [name]      Encounter Date: 07/17/2016  Check In:     Session Check In - 07/17/16 1639      Check-In   Location [facility]-Cardiac & Pulmonary Rehab   Staff Present Gerlene Burdock, RN, Moises Blood, BS, ACSM CEP, Exercise Physiologist;Mary Kellie Shropshire, RN, BSN, MA   Supervising physician immediately available to respond to emergencies See telemetry face sheet for immediately available ER MD   Medication changes reported     No   Fall or balance concerns reported    No   Warm-up and Cool-down Performed on first and last piece of equipment   Resistance Training Performed Yes   VAD Patient? No     Pain Assessment   Currently in Pain? No/denies   Multiple Pain Sites No         Goals Met:  Independence with exercise equipment Exercise tolerated well No report of cardiac concerns or symptoms Strength training completed today  Goals Unmet:  Not Applicable  Comments: Pt able to follow exercise prescription today without complaint.  Will continue to monitor for progression.    Dr. Emily Filbert is Medical Director for [place] and [facility].

## 2016-07-17 NOTE — Telephone Encounter (Signed)
Please see 07/16/16 documentation from Dr Radford Pax.   I spoke with the pt and his swelling has improved.  In the morning his legs are back to normal and he denies SOB.  I instructed the pt to limit the salt in his diet and elevate his legs when seated.  The pt is also taking Amlodipine which can contribute to swelling.  The pt currently has a pending appointment with Dr [name] on 07/27/16.  The pt will keep his scheduled appointment and contact the office with any change in symptoms.  Pt agreed with plan. Pt is okay to proceed with cardiac rehabilitation.

## 2016-07-19 ENCOUNTER — Encounter: Payer: Commercial Managed Care - HMO | Admitting: *Deleted

## 2016-07-19 DIAGNOSIS — Z955 Presence of coronary angioplasty implant and graft: Secondary | ICD-10-CM

## 2016-07-19 NOTE — Progress Notes (Signed)
Daily Session Note  Patient Details  Name: Gilbert Obrien MRN: [id number] Date of Birth: Jun 01, 1946 Referring Provider:   Flowsheet Row Cardiac Rehab from 05/11/2016 in [facility] Cardiac and Pulmonary Rehab  Referring Provider  [name]      Encounter Date: 07/19/2016  Check In:     Session Check In - 07/19/16 1659      Check-In   Location [facility]-Cardiac & Pulmonary Rehab   Staff Present Heath Lark, RN, BSN, CCRP;Ben Sanz, RN, Vickki Hearing, BA, ACSM CEP, Exercise Physiologist   Supervising physician immediately available to respond to emergencies See telemetry face sheet for immediately available ER MD   Medication changes reported     No   Fall or balance concerns reported    No   VAD Patient? No     Pain Assessment   Currently in Pain? No/denies         Goals Met:  Proper associated with RPD/PD & O2 Sat Exercise tolerated well No report of cardiac concerns or symptoms  Goals Unmet:  Not Applicable  Comments:     Dr. Emily Filbert is Medical Director for [place] and [facility].

## 2016-07-20 ENCOUNTER — Encounter: Payer: Commercial Managed Care - HMO | Admitting: *Deleted

## 2016-07-20 DIAGNOSIS — Z955 Presence of coronary angioplasty implant and graft: Secondary | ICD-10-CM | POA: Diagnosis not present

## 2016-07-20 NOTE — Progress Notes (Signed)
Daily Session Note  Patient Details  Name: Gilbert Obrien MRN: [id number] Date of Birth: Feb 05, 1946 Referring Provider:   Flowsheet Row Cardiac Rehab from 05/11/2016 in [facility] Cardiac and Pulmonary Rehab  Referring Provider  [name]      Encounter Date: 07/20/2016  Check In:     Session Check In - 07/20/16 1706      Check-In   Location [facility]-Cardiac & Pulmonary Rehab   Staff Present Gerlene Burdock, RN, Moises Blood, BS, ACSM CEP, Exercise Physiologist;Amanda Oletta Darter, [place], ACSM CEP, Exercise Physiologist   Supervising physician immediately available to respond to emergencies See telemetry face sheet for immediately available ER MD   Medication changes reported     No   Fall or balance concerns reported    No   Warm-up and Cool-down Performed on first and last piece of equipment   Resistance Training Performed Yes   VAD Patient? No     Pain Assessment   Currently in Pain? No/denies   Multiple Pain Sites No         Goals Met:  Independence with exercise equipment Exercise tolerated well No report of cardiac concerns or symptoms Strength training completed today  Goals Unmet:  Not Applicable  Comments: Pt able to follow exercise prescription today without complaint.  Will continue to monitor for progression.    Dr. Emily Filbert is Medical Director for [place] and [facility].

## 2016-07-24 ENCOUNTER — Encounter: Payer: Commercial Managed Care - HMO | Admitting: *Deleted

## 2016-07-24 VITALS — Ht 65.0 in | Wt 181.2 lb

## 2016-07-24 DIAGNOSIS — Z955 Presence of coronary angioplasty implant and graft: Secondary | ICD-10-CM

## 2016-07-24 NOTE — Progress Notes (Signed)
Daily Session Note  Patient Details  Name: Gilbert Obrien MRN: [id number] Date of Birth: Jun 16, 1946 Referring Provider:   Flowsheet Row Cardiac Rehab from 05/11/2016 in [facility] Cardiac and Pulmonary Rehab  Referring Provider  [name]      Encounter Date: 07/24/2016  Check In:     Session Check In - 07/24/16 1802      Check-In   Location [facility]-Cardiac & Pulmonary Rehab   Staff Present Gerlene Burdock, RN, BSN;Susanne Bice, RN, BSN, Laveda Norman, BS, ACSM CEP, Exercise Physiologist   Supervising physician immediately available to respond to emergencies See telemetry face sheet for immediately available ER MD   Medication changes reported     No   Fall or balance concerns reported    No   Warm-up and Cool-down Performed on first and last piece of equipment   Resistance Training Performed Yes   VAD Patient? No     Pain Assessment   Currently in Pain? No/denies   Multiple Pain Sites No         Goals Met:  Independence with exercise equipment Exercise tolerated well No report of cardiac concerns or symptoms Strength training completed today  Goals Unmet:  Not Applicable  Comments:      6 Minute Walk    Row Name 05/11/16 1409 07/24/16 1803       6 Minute Walk   Phase  - Discharge    Distance 1085 feet 1386 feet    Distance % Change  - 28 %    Walk Time 6 minutes 6 minutes    # of Rest Breaks  - 0    MPH 2.05 2.63    METS 2.26 3.1    RPE 9 13    VO2 Peak 7.9 10.86    Symptoms No No    Resting HR 87 bpm 83 bpm    Resting BP 122/80 136/64    Max Ex. HR 118 bpm 90 bpm    Max Ex. BP 134/60 182/88      Pt able to follow exercise prescription today without complaint.  Will continue to monitor for progression.    Dr. Emily Filbert is Medical Director for [place] and [facility].

## 2016-07-26 ENCOUNTER — Encounter: Payer: Commercial Managed Care - HMO | Attending: Cardiovascular Disease

## 2016-07-26 DIAGNOSIS — Z955 Presence of coronary angioplasty implant and graft: Secondary | ICD-10-CM | POA: Diagnosis not present

## 2016-07-26 NOTE — Progress Notes (Signed)
Daily Session Note  Patient Details  Name: Gilbert Obrien MRN: [id number] Date of Birth: 1946-09-24 Referring Provider:   Flowsheet Row Cardiac Rehab from 05/11/2016 in [facility] Cardiac and Pulmonary Rehab  Referring Provider  [name]      Encounter Date: 07/26/2016  Check In:     Session Check In - 07/26/16 1748      Check-In   Location [facility]-Cardiac & Pulmonary Rehab   Staff Present Jeanell Sparrow, DPT, Burlene Arnt, BA, ACSM CEP, Exercise Physiologist;Other   Supervising physician immediately available to respond to emergencies See telemetry face sheet for immediately available ER MD   Medication changes reported     No   Fall or balance concerns reported    No   Warm-up and Cool-down Performed on first and last piece of equipment   Resistance Training Performed Yes   VAD Patient? No     Pain Assessment   Currently in Pain? No/denies   Multiple Pain Sites No           Exercise Prescription Changes - 07/26/16 1200      Exercise Review   Progression Yes     Response to Exercise   Blood Pressure (Admit) 136/64   Blood Pressure (Exercise) 182/88   Blood Pressure (Exit) 140/80   Heart Rate (Admit) 57 bpm   Heart Rate (Exercise) 113 bpm   Heart Rate (Exit) 89 bpm   Rating of Perceived Exertion (Exercise) 13   Duration Progress to 45 minutes of aerobic exercise without signs/symptoms of physical distress   Intensity THRR unchanged     Progression   Progression Continue to progress workloads to maintain intensity without signs/symptoms of physical distress.   Average METs 2.81     Resistance Training   Training Prescription Yes   Weight 5   Reps 10-12     Interval Training   Interval Training No     Treadmill   MPH 2   Grade 1   Minutes 15   METs 2.81      Goals Met:  Independence with exercise equipment Exercise tolerated well No report of cardiac concerns or symptoms Strength training completed today  Goals Unmet:  Not  Applicable  Comments:  Gilbert Obrien graduated today from cardiac rehab with 36 sessions completed.  Details of the patient's exercise prescription and what he needs to do in order to continue the prescription and progress were discussed with patient.  Patient was given a copy of prescription and goals.  Patient verbalized understanding.  Gilbert Obrien plans [place] [facility] [place] [facility] [place] [facility] [employer] [facility] [employer]    Dr. Emily Filbert is Medical Director for [place] and [facility].

## 2016-07-27 ENCOUNTER — Other Ambulatory Visit: Payer: Commercial Managed Care - HMO | Admitting: *Deleted

## 2016-07-27 ENCOUNTER — Encounter: Payer: Self-pay | Admitting: Cardiovascular Disease

## 2016-07-27 ENCOUNTER — Ambulatory Visit (INDEPENDENT_AMBULATORY_CARE_PROVIDER_SITE_OTHER): Payer: Commercial Managed Care - HMO | Admitting: Cardiovascular Disease

## 2016-07-27 ENCOUNTER — Encounter (INDEPENDENT_AMBULATORY_CARE_PROVIDER_SITE_OTHER): Payer: Self-pay

## 2016-07-27 ENCOUNTER — Other Ambulatory Visit: Payer: Self-pay

## 2016-07-27 ENCOUNTER — Ambulatory Visit (HOSPITAL_COMMUNITY): Payer: Commercial Managed Care - HMO | Attending: Cardiology

## 2016-07-27 VITALS — BP 140/76 | HR 60 | Ht 67.0 in | Wt 181.8 lb

## 2016-07-27 DIAGNOSIS — I255 Ischemic cardiomyopathy: Secondary | ICD-10-CM | POA: Insufficient documentation

## 2016-07-27 DIAGNOSIS — I2121 ST elevation (STEMI) myocardial infarction involving left circumflex coronary artery: Secondary | ICD-10-CM

## 2016-07-27 DIAGNOSIS — I2510 Atherosclerotic heart disease of native coronary artery without angina pectoris: Secondary | ICD-10-CM | POA: Diagnosis not present

## 2016-07-27 DIAGNOSIS — I10 Essential (primary) hypertension: Secondary | ICD-10-CM | POA: Diagnosis not present

## 2016-07-27 DIAGNOSIS — Z955 Presence of coronary angioplasty implant and graft: Secondary | ICD-10-CM

## 2016-07-27 DIAGNOSIS — E785 Hyperlipidemia, unspecified: Secondary | ICD-10-CM

## 2016-07-27 DIAGNOSIS — Z79899 Other long term (current) drug therapy: Secondary | ICD-10-CM | POA: Diagnosis not present

## 2016-07-27 LAB — LIPID PANEL
CHOL/HDL RATIO: 3.1 ratio (ref <=5.0)
Cholesterol: 129 mg/dL (ref 125–200)
HDL: 41 mg/dL (ref >=40)
LDL CALC: 66 mg/dL (ref <130)
Triglycerides: 111 mg/dL (ref <150)
VLDL: 22 mg/dL (ref <30)

## 2016-07-27 LAB — HEPATIC FUNCTION PANEL
ALBUMIN: 4.3 g/dL (ref 3.6–5.1)
ALT: 16 U/L (ref 9–46)
AST: 14 U/L (ref 10–35)
Alkaline Phosphatase: 92 U/L (ref 40–115)
Bilirubin, Direct: 0.2 mg/dL (ref <=0.2)
Indirect Bilirubin: 1 mg/dL (ref 0.2–1.2)
Total Bilirubin: 1.2 mg/dL (ref 0.2–1.2)
Total Protein: 7.3 g/dL (ref 6.1–8.1)

## 2016-07-27 LAB — CBC
HCT: 39.6 % (ref 38.5–50.0)
HEMOGLOBIN: 13.8 g/dL (ref 13.2–17.1)
MCH: 29.4 pg (ref 27.0–33.0)
MCHC: 34.8 g/dL (ref 32.0–36.0)
MCV: 84.3 fL (ref 80.0–100.0)
MPV: 8.3 fL (ref 7.5–12.5)
PLATELETS: 229 10*3/uL (ref 140–400)
RBC: 4.7 MIL/uL (ref 4.20–5.80)
RDW: 13.8 % (ref 11.0–15.0)
WBC: 9.1 10*3/uL (ref 3.8–10.8)

## 2016-07-27 LAB — BASIC METABOLIC PANEL
BUN: 14 mg/dL (ref 7–25)
CALCIUM: 9.5 mg/dL (ref 8.6–10.3)
CHLORIDE: 107 mmol/L (ref 98–110)
CO2: 24 mmol/L (ref 20–31)
Creat: 1.59 mg/dL — ABNORMAL HIGH (ref 0.70–1.18)
Glucose, Bld: 137 mg/dL — ABNORMAL HIGH (ref 65–99)
Potassium: 4.3 mmol/L (ref 3.5–5.3)
SODIUM: 143 mmol/L (ref 135–146)

## 2016-07-27 MED ORDER — RAMIPRIL 5 MG PO CAPS: 5.0000 mg | ORAL_CAPSULE | Freq: Every day | ORAL | 11 refills | Status: DC

## 2016-07-27 MED ORDER — RAMIPRIL 5 MG PO CAPS: 5.0000 mg | ORAL_CAPSULE | Freq: Every day | ORAL | 3 refills | Status: DC

## 2016-07-27 NOTE — Progress Notes (Signed)
Cardiology Office Note Date:  07/27/2016   ID:  Gilbert Obrien, DOB 26-Oct-1945, MRN [id number]  PCP:  [name], MD  Cardiologist:  Sherren Mocha, MD    Chief Complaint  Patient presents with  . Coronary Artery Disease     History of Present Illness: Gilbert Obrien is a 70 y.o. male who presents for Follow-up of coronary artery disease. The patient initially presented with an inferoposterior STEMI in July 2017. He was found to have subtotal occlusion of the left circumflex treated with primary PCI using a Synergy drug-eluting stent. He was evaluated by cardiac surgery because of residual multivessel disease, but ultimately underwent staged multivessel PCI with orbital atherectomy and drug-eluting stent implantation in the proximal right coronary artery and drug-eluting stent placement in the LAD. He had moderate LV dysfunction with an LVEF of 35-40%.  The patient is doing well. He is participating in cardiac rehabilitation without exertional symptoms. He does notice some discomfort in his left chest when he moves his arm certain ways. He denies shortness of breath or heart palpitations. He occasionally has leg swelling and relates this to sodium intake. No excessive bleeding problems. No other complaints. He's learned a lot from cardiac rehabilitation and seems to have a good understanding of his medications as well as lifestyle recommendations post MI.   Past Medical History:  Diagnosis Date  . Arthritis   . Coronary artery disease    a. STEMI 03/2016 DESx1 to LCx, DES x 1 Mid LAD, DES x1 prox RCA  . Depression   . Diabetes mellitus without complication ([place])    type 2  . History of acute inferior wall myocardial infarction 04/15/2016   inf-lat/post >> PCI with DES of LCx; staged PCI of LAD and RCA  . Hypercholesteremia   . Hypertension   . Ischemic cardiomyopathy 04/19/2016   A. Inf-lat/post STEMI 7/17 >> b. Echo 04/17/16: Septal and post lateral HK, poor image quality,  EF 35-40%    Past Surgical History:  Procedure Laterality Date  . CARDIAC CATHETERIZATION N/A 04/15/2016   Procedure: Left Heart Cath and Coronary Angiography;  Surgeon: Sherren Mocha, MD;  Location: [place] [facility];  Service: Cardiovascular;  Laterality: N/A;  . CARDIAC CATHETERIZATION N/A 04/15/2016   Procedure: Coronary Stent Intervention;  Surgeon: Sherren Mocha, MD;  Location: [place] [facility];  Service: Cardiovascular;  Laterality: N/A;  . CARDIAC CATHETERIZATION N/A 04/17/2016   Procedure: Coronary Stent Intervention;  Surgeon: Burnell Blanks, MD;  Location: [place] [facility];  Service: Cardiovascular;  Laterality: N/A;  . CARDIAC CATHETERIZATION N/A 04/18/2016   Procedure: Coronary Stent Intervention;  Surgeon: Burnell Blanks, MD;  Location: [place] [facility];  Service: Cardiovascular;  Laterality: N/A;  . CARDIAC CATHETERIZATION N/A 04/18/2016   Procedure: Temporary Pacemaker;  Surgeon: Burnell Blanks, MD;  Location: [place] [facility];  Service: Cardiovascular;  Laterality: N/A;  . CORONARY STENT PLACEMENT  04/17/2016    Severe stenosis proximal LAD, now s/p successful PTCA/DES x 1 proximal and mid LAD  . COSMETIC SURGERY  1974   right side facial       . SHOULDER SURGERY      Current Outpatient Prescriptions  Medication Sig Dispense Refill  . amLODipine (NORVASC) 10 MG tablet Take 1 tablet (10 mg total) by mouth daily. 30 tablet 12  . aspirin EC 81 MG EC tablet Take 1 tablet (81 mg total) by mouth daily.    Marland Kitchen atorvastatin (LIPITOR) 80 MG tablet  Take 1 tablet (80 mg total) by mouth daily at 6 PM. 30 tablet 0  . metoprolol tartrate (LOPRESSOR) 25 MG tablet Take 1 tablet (25 mg total) by mouth 2 (two) times daily. 60 tablet 0  . Multiple Vitamin (MULTIVITAMIN) tablet Take 1 tablet by mouth daily.    . nitroGLYCERIN (NITROSTAT) 0.4 MG SL tablet Place 1 tablet (0.4 mg total) under the tongue every 5 (five) minutes x 3 doses as needed for chest pain. 25  tablet 1  . Potassium (POTASSIMIN PO) Take 99 mg by mouth daily.    . ramipril (ALTACE) 5 MG capsule Take 1 capsule (5 mg total) by mouth daily. 90 capsule 3  . temazepam (RESTORIL) 15 MG capsule Take 15 mg by mouth at bedtime as needed for sleep.    . ticagrelor (BRILINTA) 90 MG TABS tablet Take 1 tablet (90 mg total) by mouth 2 (two) times daily. 60 tablet 0  . traMADol (ULTRAM) 50 MG tablet Take 50 mg by mouth every 6 (six) hours as needed for moderate pain.     No current facility-administered medications for this visit.     Allergies:   Atorvastatin; Celecoxib; and Sulfa antibiotics   Social History:  The patient  reports that he quit smoking about 15 years ago. He has never used smokeless tobacco. He reports that he does not drink alcohol or use drugs.   Family History:  The patient's  family history includes Heart attack in his brother; Heart disease in his brother.    ROS:  Please see the history of present illness.  Otherwise, review of systems is positive for Weight gain, appetite change, depression, back pain, easy bruising, shortness of breath lying down, leg pain.  All other systems are reviewed and negative.    PHYSICAL EXAM: VS:  BP 140/76   Pulse 60   Ht 5\' 7"  (1.702 m)   Wt 82.5 kg (181 lb 12.8 oz)   BMI 28.47 kg/m  , BMI Body mass index is 28.47 kg/m. GEN: Well nourished, well developed, in no acute distress  HEENT: normal  Neck: no JVD, no masses. No carotid bruits Cardiac: RRR without murmur or gallop                Respiratory:  clear to auscultation bilaterally, normal work of breathing GI: soft, nontender, nondistended, + BS MS: no deformity or atrophy  Ext: no pretibial edema, pedal pulses 2+= bilaterally Skin: warm and dry, no rash Neuro:  Strength and sensation are intact Psych: euthymic mood, full affect  EKG:  EKG is not ordered today.  Recent Labs: 04/15/2016: ALT 17; B Natriuretic Peptide 31.7; TSH 1.509 04/19/2016: Hemoglobin 10.7; Platelets  186 05/02/2016: BUN 10; Creat 1.46; Potassium 4.0; Sodium 139   Lipid Panel     Component Value Date/Time   CHOL 175 04/16/2016 0238   TRIG 209 (H) 04/16/2016 0238   HDL 32 (L) 04/16/2016 0238   CHOLHDL 5.5 04/16/2016 0238   VLDL 42 (H) 04/16/2016 0238   LDLCALC 101 (H) 04/16/2016 0238      Wt Readings from Last 3 Encounters:  07/27/16 82.5 kg (181 lb 12.8 oz)  07/24/16 82.2 kg (181 lb 3.2 oz)  05/11/16 80.7 kg (177 lb 14.4 oz)     Cardiac Studies Reviewed: [facility] 04/18/16 LAD stent ok LCx stent ok RCA prox 90% PCI:  Orbital atherectomy and 4 x 24 mm Synergy DES to RCA 1. Severe heavily calcified stenosis in the proximal to mid RCA.  2. Successful orbital atherectomy of the proximal RCA stenosis.  3. Successful PTCA/DES x 1 proximal RCA stenosis.    LHC 04/17/16 PCI: 3.5 x 38 mm Synergy DES to proximal and mid LAD   Echo 04/17/16 Septal and post lateral HK, poor image quality, EF 35-40%  UE Arterial US 04/16/16 The right subclavian, axillary, brachial, radial, ulnar, and palmar arch arteries were visualized and found to be patent with triphasic flow.  LHC 04/15/16 LAD prox 50%, mid 90% LCx mid 99% RCA prox 90% PCI: 3.5 x 20 mm Synergy DES to LCx 1. Acute inferolateral STEMI secondary to subtotal occlusion of the left circumflex, treated successfully with primary PCI using a Synergy DES 2. Severe stenosis of the proximal LAD and proximal RCA 3. Mild segmental contraction of the left ventricle with preserved overall LVEF Recommendations: The patient will need further revascularization. He will be continued on tirofiban until a decision is made for CABG versus multivessel PCI. Case d/w Dr Cyndia Bent.   2D Echo today pending  ASSESSMENT AND PLAN: 1.  CAD, native vessel, with old MI: Patient with severe multivessel coronary artery disease treated with multivessel PCI. He is stable without symptoms of angina. Has left-sided chest pain is likely musculoskeletal as it only occurs  with certain arm movements. He seems to be tolerating his medical program well. Medications reviewed today. May need to change brilinta to Plavix depending on cost issues. He will let us know.  2. Hypertension with chronic kidney disease: The patient's blood pressure has been mildly elevated. I recommended that he increase ramipril to 5 mg daily. He will continue on amlodipine 10 mg and metoprolol 25 mg twice daily.  3. Hyperlipidemia: The patient is treated with a high intensity statin drug. Will draw labs today.  4. Hyperglycemia: We'll check a hemoglobin A1c today. High suspicion for the presence of type 2 diabetes.  5. Ischemic cardiomyopathy without symptoms of congestive heart failure: The patient had an echocardiogram this morning. Formal interpretation is pending, but on my review of images his LVEF appears improved from previous.  Overall the patient appears to be doing very well. Will contact him with the result of his lab work and echocardiogram result as soon as they are available.   Current medicines are reviewed with the patient today.  The patient does not have concerns regarding medicines.  Labs/ tests ordered today include:   Orders Placed This Encounter  Procedures  . Basic metabolic panel  . CBC  . HgB A1c    Disposition:   FU 3 months with APP  Signed, Sherren Mocha, MD  07/27/2016 12:56 PM    [address], [place], [place]  [address] Phone: [phone]; Fax: [phone]

## 2016-07-27 NOTE — Progress Notes (Signed)
Discharge Summary  Patient Details  Name: Gilbert Obrien MRN: [id number] Date of Birth: [birth year] Referring Provider:   Flowsheet Row Cardiac Rehab from 05/11/2016 in [place] and [facility]  Referring Provider  [name]       Number of Visits: 41  Reason for Discharge:  Patient reached a stable level of exercise. Patient independent in their exercise.  Smoking History:  History  Smoking Status  . Former Smoker  . Quit date: 09/25/2000  Smokeless Tobacco  . Never Used    Diagnosis:  S/P coronary artery stent placement  ADL [facility]:    Initial Exercise Prescription:     Initial Exercise Prescription - 05/11/16 1400      Date of Initial Exercise RX and Referring Provider   Date 05/11/16   Referring Provider [name]     Treadmill   MPH 1.6   Grade 0.5   Minutes 15   METs 2.25     Recumbant Bike   Level 1   RPM 60   Minutes 15   METs 2     NuStep   Level 3   Minutes 15   METs 2     T5 Nustep   Level 2   Minutes 15   METs 2     Prescription Details   Frequency (times per week) 3   Duration Progress to 45 minutes of aerobic exercise without signs/symptoms of physical distress     Intensity   THRR 40-80% of Max Heartrate 112-137   Ratings of Perceived Exertion 11-13     Progression   Progression Continue to progress workloads to maintain intensity without signs/symptoms of physical distress.     Resistance Training   Training Prescription Yes   Weight 3   Reps 10-15      Discharge Exercise Prescription (Final Exercise Prescription Changes):     Exercise Prescription Changes - 07/26/16 1200      Exercise Review   Progression Yes     Response to Exercise   Blood Pressure (Admit) 136/64   Blood Pressure (Exercise) 182/88   Blood Pressure (Exit) 140/80   Heart Rate (Admit) 57 bpm   Heart Rate (Exercise) 113 bpm   Heart Rate (Exit) 89 bpm   Rating of Perceived Exertion (Exercise) 13   Duration Progress to 45 minutes of aerobic  exercise without signs/symptoms of physical distress   Intensity THRR unchanged     Progression   Progression Continue to progress workloads to maintain intensity without signs/symptoms of physical distress.   Average METs 2.81     Resistance Training   Training Prescription Yes   Weight 5   Reps 10-12     Interval Training   Interval Training No     Treadmill   MPH 2   Grade 1   Minutes 15   METs 2.81      Functional Capacity:     6 Minute Walk    Row Name 05/11/16 1409 07/24/16 1803       6 Minute Walk   Phase  - Discharge    Distance 1085 feet 1386 feet    Distance % Change  - 28 %    Walk Time 6 minutes 6 minutes    # of Rest Breaks  - 0    MPH 2.05 2.63    METS 2.26 3.1    RPE 9 13    VO2 Peak 7.9 10.86    Symptoms No No    Resting HR 87  bpm 83 bpm    Resting BP 122/80 136/64    Max Ex. HR 118 bpm 90 bpm    Max Ex. BP 134/60 182/88       Psychological, QOL, Others - Outcomes: PHQ 2/9: Depression screen [facility] 2/9 07/20/2016 05/11/2016  Decreased Interest 1 1  Down, Depressed, Hopeless 1 1  PHQ - 2 Score 2 2  Altered sleeping 2 1  Tired, decreased energy 2 1  Change in appetite 1 0  Feeling bad or failure about yourself  1 0  Trouble concentrating 1 0  Moving slowly or fidgety/restless 0 0  Suicidal thoughts 0 0  PHQ-9 Score 9 4  Difficult doing work/chores Somewhat difficult Somewhat difficult    Quality of Life:     Quality of Life - 07/20/16 1810      Quality of Life Scores   Health/Function Post 18.4 %   Socioeconomic Post 18.86 %   Psych/Spiritual Post 18.86 %   Family Post 24 %   GLOBAL Post 19.41 %      Personal Goals: Goals established at orientation with interventions provided to work toward goal.     Personal Goals and Risk Factors at Admission - 05/11/16 1428      Core Components/Risk Factors/Patient Goals on Admission   Increase Strength and Stamina Yes   Intervention Provide advice, education, support and counseling  about physical activity/exercise needs.;Develop an individualized exercise prescription for aerobic and resistive training based on initial evaluation findings, risk stratification, comorbidities and participant's personal goals.   Expected Outcomes Achievement of increased cardiorespiratory fitness and enhanced flexibility, muscular endurance and strength shown through measurements of functional capacity and personal statement of participant.   Diabetes Yes   Intervention Provide education about signs/symptoms and action to take for hypo/hyperglycemia.;Provide education about proper nutrition, including hydration, and aerobic/resistive exercise prescription along with prescribed medications to achieve blood glucose in normal ranges: Fasting glucose 65-99 mg/dL   Expected Outcomes Short Term: Participant verbalizes understanding of the signs/symptoms and immediate care of hyper/hypoglycemia, proper foot care and importance of medication, aerobic/resistive exercise and nutrition plan for blood glucose control.;Long Term: Attainment of HbA1C < 7%.   Hypertension Yes   Intervention Provide education on lifestyle modifcations including regular physical activity/exercise, weight management, moderate sodium restriction and increased consumption of fresh fruit, vegetables, and low fat dairy, alcohol moderation, and smoking cessation.;Monitor prescription use compliance.   Expected Outcomes Short Term: Continued assessment and intervention until BP is < 140/44m HG in hypertensive participants. < 130/877mHG in hypertensive participants with diabetes, heart failure or chronic kidney disease.;Long Term: Maintenance of blood pressure at goal levels.   Lipids Yes   Intervention Provide education and support for participant on nutrition & aerobic/resistive exercise along with prescribed medications to achieve LDL <7029mHDL >99m23m Expected Outcomes Short Term: Participant states understanding of desired cholesterol  values and is compliant with medications prescribed. Participant is following exercise prescription and nutrition guidelines.;Long Term: Cholesterol controlled with medications as prescribed, with individualized exercise RX and with personalized nutrition plan. Value goals: LDL < 70mg71mL > 40 mg.       Personal Goals Discharge:     Goals and Risk Factor Review    Row Name 05/26/16 0752 06/16/16 1229 07/24/16 0908         Core Components/Risk Factors/Patient Goals Review   Personal Goals Review Hypertension;Diabetes;Lipids Diabetes;Hypertension;Lipids  -     Review Rays BG has been within normal limits pre and post exercise so  far.  He met with the RD and feels he can better put together proper portions of carbs, fats and proteins.  She also discussed low sodium diet with Ray. Ray does not monitor his BG at home.  He was not clear if he is diabetic or pre diabetic.  Collie Siad will follow up and talk to [place].  He is taking all meds as directed for HTN and lipids.   His blood sugars have been good. Has knee and back problem and shoulder problems. He wants to cont to exercise. I mentioned about physical therapy free screening.  His blood pressure is staying good. " I try to do the right thing if I can".      Expected Outcomes Ray will continue to have BG within normal limits and see improvement in overall health from dietary changes. Ray will confirm his status with BG with his Dr. and continue to take meds as directed.  He will continue to keep his risk factors under control and reduce the risk of further cardiovascular events.  -        Nutrition & Weight - Outcomes:     Pre Biometrics - 05/11/16 1408      Pre Biometrics   Height _0  (1.651 m)   Weight 177 lb 14.4 oz (80.7 kg)   Waist Circumference 43.25 inches   Hip Circumference 39.25 inches   Waist to Hip Ratio 1.1 %   BMI (Calculated) 29.7   Single Leg Stand 8.33 seconds         Post Biometrics - 07/24/16 1805       Post   Biometrics   Height _1  (1.651 m)   Weight 181 lb 3.2 oz (82.2 kg)   Waist Circumference 41 inches   Hip Circumference 38 inches   Waist to Hip Ratio 1.08 %   BMI (Calculated) 30.2      Nutrition:     Nutrition Therapy & Goals - 05/24/16 1642      Nutrition Therapy   Diet 1600kcal basic heart healthy diet   Protein (specify units) 7oz   Fiber 25 grams   Whole Grain Foods 3 servings   Saturated Fats 12 max. grams   Fruits and Vegetables 5 servings/day   Sodium 1500 grams     Personal Nutrition Goals   Personal Goal #1 Use menus (provided) for more meal ideas, to add variety of heart healthy options   Comments Patient is working to [employer] for himself; he states he needs more ideas of what he can eat. Provided sample menus for 2 weeks of ideas.      Intervention Plan   Intervention Prescribe, educate and counsel regarding individualized specific dietary modifications aiming towards targeted core components such as weight, hypertension, lipid management, diabetes, heart failure and other comorbidities.;Nutrition handout(s) given to patient.   Expected Outcomes Short Term Goal: Understand basic principles of dietary content, such as calories, fat, sodium, cholesterol and nutrients.;Short Term Goal: A plan has been developed with personal nutrition goals set during dietitian appointment.;Long Term Goal: Adherence to prescribed nutrition plan.      Nutrition Discharge:     Nutrition Assessments - 07/20/16 1810      Rate Your Plate Scores   Post Score 75   Post Score % 83.3 %      Education Questionnaire Score:     Knowledge Questionnaire Score - 07/20/16 1811      Knowledge Questionnaire Score   Post Score 24  Goals reviewed with patient; copy given to patient.

## 2016-07-27 NOTE — Patient Instructions (Addendum)
Medication Instructions:  Your physician has recommended you make the following change in your medication:  1. INCREASE Ramipril to 5mg  take one tablet by mouth daily  Please contact the office at [phone] if cost of Brilinta is to much out of pocket.  We will switch you to Plavix 75mg  take one tablet by mouth daily.    Labwork: Your physician recommends that you have lab work today: CBC, CMP, Lipid and HgbA1c  Testing/Procedures: No new orders.   Follow-Up: Your physician recommends that you schedule a follow-up appointment in: 3 MONTHS with PA/NP   Any Other Special Instructions Will Be Listed Below (If Applicable).     If you need a refill on your cardiac medications before your next appointment, please call your pharmacy.

## 2016-07-27 NOTE — Patient Instructions (Signed)
Discharge Instructions  Patient Details  Name: Gilbert Obrien MRN: [id number] Date of Birth: 18-Aug-1946 Referring Provider:  No ref. provider found   Number of Visits: 36  Reason for Discharge:  Patient reached a stable level of exercise. Patient independent in their exercise.  Smoking History:  History  Smoking Status  . Former Smoker  . Quit date: 09/25/2000  Smokeless Tobacco  . Never Used    Diagnosis:  S/P coronary artery stent placement  Initial Exercise Prescription:     Initial Exercise Prescription - 05/11/16 1400      Date of Initial Exercise RX and Referring Provider   Date 05/11/16   Referring Provider [name]     Treadmill   MPH 1.6   Grade 0.5   Minutes 15   METs 2.25     Recumbant Bike   Level 1   RPM 60   Minutes 15   METs 2     NuStep   Level 3   Minutes 15   METs 2     T5 Nustep   Level 2   Minutes 15   METs 2     Prescription Details   Frequency (times per week) 3   Duration Progress to 45 minutes of aerobic exercise without signs/symptoms of physical distress     Intensity   THRR 40-80% of Max Heartrate 112-137   Ratings of Perceived Exertion 11-13     Progression   Progression Continue to progress workloads to maintain intensity without signs/symptoms of physical distress.     Resistance Training   Training Prescription Yes   Weight 3   Reps 10-15      Discharge Exercise Prescription (Final Exercise Prescription Changes):     Exercise Prescription Changes - 07/26/16 1200      Exercise Review   Progression Yes     Response to Exercise   Blood Pressure (Admit) 136/64   Blood Pressure (Exercise) 182/88   Blood Pressure (Exit) 140/80   Heart Rate (Admit) 57 bpm   Heart Rate (Exercise) 113 bpm   Heart Rate (Exit) 89 bpm   Rating of Perceived Exertion (Exercise) 13   Duration Progress to 45 minutes of aerobic exercise without signs/symptoms of physical distress   Intensity THRR unchanged     Progression   Progression Continue to progress workloads to maintain intensity without signs/symptoms of physical distress.   Average METs 2.81     Resistance Training   Training Prescription Yes   Weight 5   Reps 10-12     Interval Training   Interval Training No     Treadmill   MPH 2   Grade 1   Minutes 15   METs 2.81      Functional Capacity:     6 Minute Walk    Row Name 05/11/16 1409 07/24/16 1803       6 Minute Walk   Phase  - Discharge    Distance 1085 feet 1386 feet    Distance % Change  - 28 %    Walk Time 6 minutes 6 minutes    # of Rest Breaks  - 0    MPH 2.05 2.63    METS 2.26 3.1    RPE 9 13    VO2 Peak 7.9 10.86    Symptoms No No    Resting HR 87 bpm 83 bpm    Resting BP 122/80 136/64    Max Ex. HR 118 bpm 90 bpm    Max  Ex. BP 134/60 182/88       Quality of Life:     Quality of Life - 07/20/16 1810      Quality of Life Scores   Health/Function Post 18.4 %   Socioeconomic Post 18.86 %   Psych/Spiritual Post 18.86 %   Family Post 24 %   GLOBAL Post 19.41 %      Personal Goals: Goals established at orientation with interventions provided to work toward goal.     Personal Goals and Risk Factors at Admission - 05/11/16 1428      Core Components/Risk Factors/Patient Goals on Admission   Increase Strength and Stamina Yes   Intervention Provide advice, education, support and counseling about physical activity/exercise needs.;Develop an individualized exercise prescription for aerobic and resistive training based on initial evaluation findings, risk stratification, comorbidities and participant's personal goals.   Expected Outcomes Achievement of increased cardiorespiratory fitness and enhanced flexibility, muscular endurance and strength shown through measurements of functional capacity and personal statement of participant.   Diabetes Yes   Intervention Provide education about signs/symptoms and action to take for hypo/hyperglycemia.;Provide education  about proper nutrition, including hydration, and aerobic/resistive exercise prescription along with prescribed medications to achieve blood glucose in normal ranges: Fasting glucose 65-99 mg/dL   Expected Outcomes Short Term: Participant verbalizes understanding of the signs/symptoms and immediate care of hyper/hypoglycemia, proper foot care and importance of medication, aerobic/resistive exercise and nutrition plan for blood glucose control.;Long Term: Attainment of HbA1C < 7%.   Hypertension Yes   Intervention Provide education on lifestyle modifcations including regular physical activity/exercise, weight management, moderate sodium restriction and increased consumption of fresh fruit, vegetables, and low fat dairy, alcohol moderation, and smoking cessation.;Monitor prescription use compliance.   Expected Outcomes Short Term: Continued assessment and intervention until BP is < 140/55mm HG in hypertensive participants. < 130/19mm HG in hypertensive participants with diabetes, heart failure or chronic kidney disease.;Long Term: Maintenance of blood pressure at goal levels.   Lipids Yes   Intervention Provide education and support for participant on nutrition & aerobic/resistive exercise along with prescribed medications to achieve LDL 70mg , HDL >40mg .   Expected Outcomes Short Term: Participant states understanding of desired cholesterol values and is compliant with medications prescribed. Participant is following exercise prescription and nutrition guidelines.;Long Term: Cholesterol controlled with medications as prescribed, with individualized exercise RX and with personalized nutrition plan. Value goals: LDL < 70mg , HDL > 40 mg.       Personal Goals Discharge:     Goals and Risk Factor Review - 07/24/16 0908      Core Components/Risk Factors/Patient Goals Review   Review His blood sugars have been good. Has knee and back problem and shoulder problems. He wants to cont to exercise. I mentioned  about physical therapy free screening.  His blood pressure is staying good. " I try to do the right thing if I can".       Nutrition & Weight - Outcomes:     Pre Biometrics - 05/11/16 1408      Pre Biometrics   Height 5\' 5"  (1.651 m)   Weight 177 lb 14.4 oz (80.7 kg)   Waist Circumference 43.25 inches   Hip Circumference 39.25 inches   Waist to Hip Ratio 1.1 %   BMI (Calculated) 29.7   Single Leg Stand 8.33 seconds         Post Biometrics - 07/24/16 1805       Post  Biometrics   Height 5\' 5"  (1.651 m)  Weight 181 lb 3.2 oz (82.2 kg)   Waist Circumference 41 inches   Hip Circumference 38 inches   Waist to Hip Ratio 1.08 %   BMI (Calculated) 30.2      Nutrition:     Nutrition Therapy & Goals - 05/24/16 1642      Nutrition Therapy   Diet 1600kcal basic heart healthy diet   Protein (specify units) 7oz   Fiber 25 grams   Whole Grain Foods 3 servings   Saturated Fats 12 max. grams   Fruits and Vegetables 5 servings/day   Sodium 1500 grams     Personal Nutrition Goals   Personal Goal #1 Use menus (provided) for more meal ideas, to add variety of heart healthy options   Comments Patient is working to [employer] for himself; he states he needs more ideas of what he can eat. Provided sample menus for 2 weeks of ideas.      Intervention Plan   Intervention Prescribe, educate and counsel regarding individualized specific dietary modifications aiming towards targeted core components such as weight, hypertension, lipid management, diabetes, heart failure and other comorbidities.;Nutrition handout(s) given to patient.   Expected Outcomes Short Term Goal: Understand basic principles of dietary content, such as calories, fat, sodium, cholesterol and nutrients.;Short Term Goal: A plan has been developed with personal nutrition goals set during dietitian appointment.;Long Term Goal: Adherence to prescribed nutrition plan.      Nutrition Discharge:     Nutrition Assessments -  07/20/16 1810      Rate Your Plate Scores   Post Score 75   Post Score % 83.3 %      Education Questionnaire Score:     Knowledge Questionnaire Score - 07/20/16 1811      Knowledge Questionnaire Score   Post Score 24      Goals reviewed with patient; copy given to patient.

## 2016-07-27 NOTE — Progress Notes (Signed)
Cardiac Individual Treatment Plan  Patient Details  Name: Gilbert Obrien MRN: [id number] Date of Birth: [birth year] Referring Provider:   Flowsheet Row Cardiac Rehab from 05/11/2016 in [facility] Cardiac and Pulmonary Rehab  Referring Provider  [name]      Initial Encounter Date:  Flowsheet Row Cardiac Rehab from 05/11/2016 in [facility] Cardiac and Pulmonary Rehab  Date  05/11/16  Referring Provider  [name]      Visit Diagnosis: S/P coronary artery stent placement  Patient's Home Medications on Admission:  Current Outpatient Prescriptions:  .  amLODipine (NORVASC) 10 MG tablet, Take 1 tablet (10 mg total) by mouth daily., Disp: 30 tablet, Rfl: 12 .  aspirin EC 81 MG EC tablet, Take 1 tablet (81 mg total) by mouth daily., Disp: , Rfl:  .  atorvastatin (LIPITOR) 80 MG tablet, Take 1 tablet (80 mg total) by mouth daily at 6 PM., Disp: 30 tablet, Rfl: 0 .  metoprolol tartrate (LOPRESSOR) 25 MG tablet, Take 1 tablet (25 mg total) by mouth 2 (two) times daily., Disp: 60 tablet, Rfl: 0 .  Multiple Vitamin (MULTIVITAMIN) tablet, Take 1 tablet by mouth daily., Disp: , Rfl:  .  nitroGLYCERIN (NITROSTAT) 0.4 MG SL tablet, Place 1 tablet (0.4 mg total) under the tongue every 5 (five) minutes x 3 doses as needed for chest pain., Disp: 25 tablet, Rfl: 1 .  Potassium (POTASSIMIN PO), Take 99 mg by mouth daily., Disp: , Rfl:  .  ramipril (ALTACE) 5 MG capsule, Take 1 capsule (5 mg total) by mouth daily., Disp: 90 capsule, Rfl: 3 .  temazepam (RESTORIL) 15 MG capsule, Take 15 mg by mouth at bedtime as needed for sleep., Disp: , Rfl:  .  ticagrelor (BRILINTA) 90 MG TABS tablet, Take 1 tablet (90 mg total) by mouth 2 (two) times daily., Disp: 60 tablet, Rfl: 0 .  traMADol (ULTRAM) 50 MG tablet, Take 50 mg by mouth every 6 (six) hours as needed for moderate pain., Disp: , Rfl:   Past Medical History: Past Medical History:  Diagnosis Date  . Arthritis   . Coronary artery disease    a. STEMI 03/2016 DESx1 to  LCx, DES x 1 Mid LAD, DES x1 prox RCA  . Depression   . Diabetes mellitus without complication ([place])    type 2  . History of acute inferior wall myocardial infarction 04/15/2016   inf-lat/post >> PCI with DES of LCx; staged PCI of LAD and RCA  . Hypercholesteremia   . Hypertension   . Ischemic cardiomyopathy 04/19/2016   A. Inf-lat/post STEMI 7/17 >> b. Echo 04/17/16: Septal and post lateral HK, poor image quality, EF 35-40%    Tobacco Use: History  Smoking Status  . Former Smoker  . Quit date: 09/25/2000  Smokeless Tobacco  . Never Used    Labs: Recent Review Flowsheet Data    Labs for ITP Cardiac and Pulmonary Rehab Latest Ref Rng & Units 04/15/2016 04/16/2016   Cholestrol 0 - 200 mg/dL 222(H) 175   LDLCALC 0 - 99 mg/dL 146(H) 101(H)   HDL >40 mg/dL 41 32(L)   Trlycerides <150 mg/dL 177(H) 209(H)   Hemoglobin A1c 4.8 - 5.6 % 7.1(H) -       Exercise Target Goals:    Exercise Program Goal: Individual exercise prescription set with THRR, safety & activity barriers. Participant demonstrates ability to understand and report RPE using BORG scale, to self-measure pulse accurately, and to acknowledge the importance of the exercise prescription.  Exercise Prescription Goal: Starting with  aerobic activity 30 plus minutes a day, 3 days per week for initial exercise prescription. Provide home exercise prescription and guidelines that participant acknowledges understanding prior to discharge.  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 HR 118 bpm 90 bpm    Max Ex. BP 134/60 182/88       Initial Exercise Prescription:     Initial Exercise Prescription - 05/11/16 1400      Date of Initial Exercise RX and Referring Provider   Date 05/11/16   Referring Provider [name]     Treadmill   MPH 1.6   Grade 0.5   Minutes 15   METs 2.25     Recumbant Bike   Level 1   RPM 60   Minutes 15   METs 2     NuStep   Level 3   Minutes 15   METs 2     T5 Nustep   Level 2   Minutes 15   METs 2     Prescription Details   Frequency (times per week) 3   Duration Progress to 45 minutes of aerobic exercise without signs/symptoms of physical distress     Intensity   THRR 40-80% of Max Heartrate 112-137   Ratings of Perceived Exertion 11-13     Progression   Progression Continue to progress workloads to maintain intensity without signs/symptoms of physical distress.     Resistance Training   Training Prescription Yes   Weight 3   Reps 10-15      Perform Capillary Blood Glucose checks as needed.  Exercise Prescription Changes:     Exercise Prescription Changes    Row Name 06/01/16 1100 06/14/16 1400 06/15/16 1100 06/28/16 1300 07/13/16 1200     Exercise Review   Progression Yes Yes  - Yes Yes     Response to Exercise   Blood Pressure (Admit) 122/66 122/70  - 138/64 138/72   Blood Pressure (Exercise) 148/62 160/80  - 162/84 126/58   Blood Pressure (Exit) 132/70 138/70  - 132/70 134/70   Heart Rate (Admit) 68 bpm 61 bpm  - 106 bpm 95 bpm   Heart Rate (Exercise) 97 bpm 109 bpm  - 103 bpm 105 bpm   Heart Rate (Exit) 55 bpm 77 bpm  - 83 bpm 84 bpm   Rating of Perceived Exertion (Exercise)  -  -  - 11 12   Symptoms none none  -  -  -   Duration  -  -  - Progress to 45  minutes of aerobic exercise without signs/symptoms of physical  distress Progress to 45 minutes of aerobic exercise without signs/symptoms of physical distress   Intensity  -  -  - THRR unchanged THRR unchanged     Progression   Progression Continue to progress workloads to maintain intensity without signs/symptoms of physical distress. Continue to progress workloads to maintain intensity without signs/symptoms of physical distress.  - Continue to progress workloads to maintain intensity without signs/symptoms of physical distress. Continue to progress workloads to maintain intensity without signs/symptoms of physical distress.   Average METs 2.87 2.15  - 3 3     Resistance Training   Training Prescription Yes Yes  - Yes Yes   Weight 3 3  - 5 5   Reps 10-15 10-15  - 10-15 10-12     Interval Training   Interval Training No  -  - No No     Treadmill   MPH  - 1  - 2  -   Grade  - 0  - 1  -   Minutes  - 15  - 15  -   METs  - 1.77  - 2.81  -     Recumbant Bike   Level 1 1  - 3 3   RPM 60 60  - 60 60   Minutes 15 15  - 15 15   METs 3.14 2.7  - 3.3 3     REL-XR   Level 1 1  -  - 3   Minutes 15 15  -  - 15   METs 2.6 2  -  -  -     Home Exercise Plan   Plans to continue exercise at  -  - [employer] (comment)  possibly [employer] and at home walking  -  -   Frequency  -  - Add 1 additional day to program exercise sessions.  -  -   Row Name 07/26/16 1200             Exercise Review   Progression Yes         Response to Exercise   Blood Pressure (Admit) 136/64       Blood Pressure (Exercise) 182/88       Blood Pressure (Exit) 140/80       Heart Rate (Admit) 57 bpm       Heart Rate (Exercise) 113 bpm       Heart Rate (Exit) 89 bpm       Rating of Perceived Exertion (Exercise) 13       Duration Progress to 45 minutes of aerobic exercise without signs/symptoms of physical distress       Intensity THRR unchanged         Progression   Progression Continue to progress workloads to maintain intensity without signs/symptoms of physical  distress.       Average METs 2.81         Resistance Training   Training Prescription Yes       Weight 5       Reps 10-12         Interval Training   Interval Training No         Treadmill   MPH 2       Grade 1       Minutes 15       METs 2.81          Exercise Comments:     Exercise Comments  Row Name 05/18/16 1311 06/01/16 1113 06/14/16 1428 06/15/16 1110 06/28/16 1315   Exercise Comments Mr Heroux has not attended a Heart Ttrack class as of 05/18/16. Ray has done well with exercise. Mr Hazelrigg is progressing well with exercise. Ray plans to walk at home and may join the [facility] to continue exercise. Ray is progressing very well with exercise by adding intensity to strength and cardio exercise.   [place] Name 07/13/16 1216 07/24/16 0905 07/24/16 1806 07/26/16 1258     Exercise Comments Ray continues to progress well with exercise. Ray is still thinking about  [employer] or our [place] since it would be free with [employer].  6 min walk done today. See 6 min data sheet for detailed report. Results reviewed with patient.  Ray has progressed well with exercise.       Discharge Exercise Prescription (Final Exercise Prescription Changes):     Exercise Prescription Changes - 07/26/16 1200      Exercise Review   Progression Yes     Response to Exercise   Blood Pressure (Admit) 136/64   Blood Pressure (Exercise) 182/88   Blood Pressure (Exit) 140/80   Heart Rate (Admit) 57 bpm   Heart Rate (Exercise) 113 bpm   Heart Rate (Exit) 89 bpm   Rating of Perceived Exertion (Exercise) 13   Duration Progress to 45 minutes of aerobic exercise without signs/symptoms of physical distress   Intensity THRR unchanged     Progression   Progression Continue to progress workloads to maintain intensity without signs/symptoms of physical distress.   Average METs 2.81     Resistance Training   Training Prescription Yes   Weight 5   Reps 10-12     Interval Training   Interval  Training No     Treadmill   MPH 2   Grade 1   Minutes 15   METs 2.81      Nutrition:  Target Goals: Understanding of nutrition guidelines, daily intake of sodium <1526m, cholesterol <2081m calories 30% from fat and 7% or less from saturated fats, daily to have 5 or more servings of fruits and vegetables.  Biometrics:     Pre Biometrics - 05/11/16 1408      Pre Biometrics   Height 5' 5" (1.651 m)   Weight 177 lb 14.4 oz (80.7 kg)   Waist Circumference 43.25 inches   Hip Circumference 39.25 inches   Waist to Hip Ratio 1.1 %   BMI (Calculated) 29.7   Single Leg Stand 8.33 seconds         Post Biometrics - 07/24/16 1805       Post  Biometrics   Height 5' 5" (1.651 m)   Weight 181 lb 3.2 oz (82.2 kg)   Waist Circumference 41 inches   Hip Circumference 38 inches   Waist to Hip Ratio 1.08 %   BMI (Calculated) 30.2      Nutrition Therapy Plan and Nutrition Goals:     Nutrition Therapy & Goals - 05/24/16 1642      Nutrition Therapy   Diet 1600kcal basic heart healthy diet   Protein (specify units) 7oz   Fiber 25 grams   Whole Grain Foods 3 servings   Saturated Fats 12 max. grams   Fruits and Vegetables 5 servings/day   Sodium 1500 grams     Personal Nutrition Goals   Personal Goal #1 Use menus (provided) for more meal ideas, to add variety of heart healthy options   Comments Patient is  working to [employer] for himself; he states he needs more ideas of what he can eat. Provided sample menus for 2 weeks of ideas.      Intervention Plan   Intervention Prescribe, educate and counsel regarding individualized specific dietary modifications aiming towards targeted core components such as weight, hypertension, lipid management, diabetes, heart failure and other comorbidities.;Nutrition handout(s) given to patient.   Expected Outcomes Short Term Goal: Understand basic principles of dietary content, such as calories, fat, sodium, cholesterol and nutrients.;Short Term  Goal: A plan has been developed with personal nutrition goals set during dietitian appointment.;Long Term Goal: Adherence to prescribed nutrition plan.      Nutrition Discharge: Rate Your Plate Scores:     Nutrition Assessments - 07/20/16 1810      Rate Your Plate Scores   Post Score 75   Post Score % 83.3 %      Nutrition Goals Re-Evaluation:     Nutrition Goals Re-Evaluation    Row Name 06/19/16 1707 07/24/16 7517           Personal Goal #1 Re-Evaluation   Personal Goal #1 Use menus (provided) for more meal ideas, to add variety of heart healthy options  -      Goal Progress Seen Yes Yes      Comments Tried some of the menus.   Still working on label reading and eating healthier   Goal to continue to work on the meal planning.  Had Salmon yesterday and green beans.  Watching his portion size also.          Psychosocial: Target Goals: Acknowledge presence or absence of depression, maximize coping skills, provide positive support system. Participant is able to verbalize types and ability to use techniques and skills needed for reducing stress and depression.  Initial Review & Psychosocial Screening:     Initial Psych Review & Screening - 05/11/16 1429      Initial Review   Current issues with Current Stress Concerns   Comments Ray reports he may have to have surgery on his hematoma of his arm since they have drained it many times and it keeps returning. Ray has a friend and she has diabetes and angina and he said it would help both of them to be in [place].      Family Dynamics   Good Support System? Yes     Barriers   Psychosocial barriers to participate in program The patient should benefit from training in stress management and relaxation.     Screening Interventions   Interventions Encouraged to exercise;Program counselor consult      Quality of Life Scores:     Quality of Life - 07/20/16 1810      Quality of Life Scores   Health/Function Post  18.4 %   Socioeconomic Post 18.86 %   Psych/Spiritual Post 18.86 %   Family Post 24 %   GLOBAL Post 19.41 %      PHQ-9: Recent Review Flowsheet Data    Depression screen [facility] 2/9 07/20/2016 05/11/2016   Decreased Interest 1 1   Down, Depressed, Hopeless 1 1   PHQ - 2 Score 2 2   Altered sleeping 2 1   Tired, decreased energy 2 1   Change in appetite 1 0   Feeling bad or failure about yourself  1 0   Trouble concentrating 1 0   Moving slowly or fidgety/restless 0 0   Suicidal thoughts 0 0   PHQ-9 Score 9 4  Difficult doing work/chores Somewhat difficult Somewhat difficult      Psychosocial Evaluation and Intervention:     Psychosocial Evaluation - 07/26/16 1717      Discharge Psychosocial Assessment & Intervention   Comments Counselor met with Mr. H for discharge psychosocial evaluation.  He reports improvement in his ability to walk further and more flexibility in movement of his arm/left shoulder.  He also states he has been able to sleep the past 3 nights without taking sleep medicine.  Mr. Lemmie Evens reports his mood is "about the same" as he is a "realist."  Counselor reviewed the PHQ-9 which increased from the initial pre-test scores.  He stated that the question regarding sleep has improved since he took this post test and his problems with appetite are no longer a concern.  So this may drop his scores by 2 points at this time - which is still a little higher (7 vs 4).  Counselor discussed coping strategies with Mr. Lemmie Evens still reporting his finances and pain from his knees and back problems have not improved so he is learning to exercise and eat healthier to feel better about life in general.  Counselor encouraged Mr. Lemmie Evens to speak with his Dr. about his mood and sleep if that improvement is inconsistent or changes.  He states reluctance to "take any more medications."  Mr. Lemmie Evens is on a fixed income and this is a possible barrier to following through on some of these recommendations.  Staff are  looking into finding assistance with medications and will provide him with this information.  Counselor commended Mr. Lemmie Evens on his progress made and his [facility] [employer] [facility] of this program.        Psychosocial Re-Evaluation:     Psychosocial Re-Evaluation    [place] Name 07/24/16 0914             Psychosocial Re-Evaluation   Interventions Encouraged to attend Cardiac Rehabilitation for the exercise       Comments Carlon is still having problems sleeping unless he takes something to help him sleep. We discussed that exercise usually helps people sleep better. Ray said that Cardiac Rehab has helped some with his mood and he wants to cont to exercise but money is an issue. I really encouraged him to check out the [employer] 9am exercise class here and the [facility] which he could use Mon-Friday for free.           Vocational Rehabilitation: Provide vocational rehab assistance to qualifying candidates.   Vocational Rehab Evaluation & Intervention:     Vocational Rehab - 05/11/16 1345      Initial Vocational Rehab Evaluation & Intervention   Assessment shows need for Vocational Rehabilitation No      Education: Education Goals: Education classes will be provided on a weekly basis, covering required topics. Participant will state understanding/return demonstration of topics presented.  Learning Barriers/Preferences:     Learning Barriers/Preferences - 05/11/16 1344      Learning Barriers/Preferences   Learning Barriers None   Learning Preferences Group Instruction      Education Topics: General Nutrition Guidelines/Fats and Fiber: -Group instruction provided by verbal, written material, models and posters to present the general guidelines for heart healthy nutrition. Gives an explanation and review of dietary fats and fiber.   Controlling Sodium/Reading Food Labels: -Group verbal and written material supporting the  discussion of sodium use in heart healthy nutrition. Review and explanation with models,  verbal and written materials for utilization of the food label. Flowsheet Row Cardiac Rehab from 07/24/2016 in [facility] Cardiac and Pulmonary Rehab  Date  06/19/16  Educator  PI  Instruction Review Code  2- meets goals/outcomes      Exercise Physiology & Risk Factors: - Group verbal and written instruction with models to review the exercise physiology of the cardiovascular system and associated critical values. Details cardiovascular disease risk factors and the goals associated with each risk factor. Flowsheet Row Cardiac Rehab from 07/24/2016 in [facility] Cardiac and Pulmonary Rehab  Date  06/26/16  Educator  [facility]  Instruction Review Code  2- meets goals/outcomes      Aerobic Exercise & Resistance Training: - Gives group verbal and written discussion on the health impact of inactivity. On the components of aerobic and resistive training programs and the benefits of this training and how to safely progress through these programs. Flowsheet Row Cardiac Rehab from 07/24/2016 in [facility] Cardiac and Pulmonary Rehab  Date  07/03/16  Educator  [facility]  Instruction Review Code  2- meets goals/outcomes      Flexibility, Balance, General Exercise Guidelines: - Provides group verbal and written instruction on the benefits of flexibility and balance training programs. Provides general exercise guidelines with specific guidelines to those with heart or lung disease. Demonstration and skill practice provided.   Stress Management: - Provides group verbal and written instruction about the health risks of elevated stress, cause of high stress, and healthy ways to reduce stress.   Depression: - Provides group verbal and written instruction on the correlation between heart/lung disease and depressed mood, treatment options, and the stigmas associated with seeking treatment. Flowsheet Row Cardiac Rehab from 07/24/2016 in [facility]  Cardiac and Pulmonary Rehab  Date  06/14/16  Educator  [facility]  Instruction Review Code  2- meets goals/outcomes      Anatomy & Physiology of the Heart: - Group verbal and written instruction and models provide basic cardiac anatomy and physiology, with the coronary electrical and arterial systems. Review of: AMI, Angina, Valve disease, Heart Failure, Cardiac Arrhythmia, Pacemakers, and the ICD.   Cardiac Procedures: - Group verbal and written instruction and models to describe the testing methods done to diagnose heart disease. Reviews the outcomes of the test results. Describes the treatment choices: Medical Management, Angioplasty, or Coronary Bypass Surgery. Flowsheet Row Cardiac Rehab from 07/24/2016 in [facility] Cardiac and Pulmonary Rehab  Date  07/17/16  Educator  CE  Instruction Review Code  2- meets goals/outcomes      Cardiac Medications: - Group verbal and written instruction to review commonly prescribed medications for heart disease. Reviews the medication, class of the drug, and side effects. Includes the steps to properly store meds and maintain the prescription regimen. Flowsheet Row Cardiac Rehab from 07/24/2016 in [facility] Cardiac and Pulmonary Rehab  Date  07/24/16  Educator  SB  Instruction Review Code  2- meets goals/outcomes      Go Sex-Intimacy & Heart Disease, Get SMART - Goal Setting: - Group verbal and written instruction through game format to discuss heart disease and the return to sexual intimacy. Provides group verbal and written material to discuss and apply goal setting through the application of the S.M.A.R.T. Method. Flowsheet Row Cardiac Rehab from 07/24/2016 in [facility] Cardiac and Pulmonary Rehab  Date  07/17/16  Educator  CE  Instruction Review Code  2- meets goals/outcomes      Other Matters of the Heart: - Provides group verbal, written materials and models to describe  Heart Failure, Angina, Valve Disease, and Diabetes in the realm of heart disease.  Includes description of the disease process and treatment options available to the cardiac patient.   Exercise & Equipment Safety: - Individual verbal instruction and demonstration of equipment use and safety with use of the equipment. Flowsheet Row Cardiac Rehab from 07/24/2016 in [facility] Cardiac and Pulmonary Rehab  Date  05/11/16  Educator  C. EnterkinRN  Instruction Review Code  1- partially meets, needs review/practice      Infection Prevention: - Provides verbal and written material to individual with discussion of infection control including proper hand washing and proper equipment cleaning during exercise session. Flowsheet Row Cardiac Rehab from 07/24/2016 in [facility] Cardiac and Pulmonary Rehab  Date  05/11/16  Educator  C. EnterkinRN  Instruction Review Code  2- meets goals/outcomes      Falls Prevention: - Provides verbal and written material to individual with discussion of falls prevention and safety. Flowsheet Row Cardiac Rehab from 07/24/2016 in [facility] Cardiac and Pulmonary Rehab  Date  05/11/16  Educator  C. [place]  Instruction Review Code  2- meets goals/outcomes      Diabetes: - Individual verbal and written instruction to review signs/symptoms of diabetes, desired ranges of glucose level fasting, after meals and with exercise. Advice that pre and post exercise glucose checks will be done for 3 sessions at entry of program. [place] from 07/24/2016 in [facility] Cardiac and Pulmonary Rehab  Date  05/11/16  Educator  C. [place]  Instruction Review Code  1- partially meets, needs review/practice       Knowledge Questionnaire Score:     Knowledge Questionnaire Score - 07/20/16 1811      Knowledge Questionnaire Score   Post Score 24      Core Components/Risk Factors/Patient Goals at Admission:     Personal Goals and Risk Factors at Admission - 05/11/16 1428      Core Components/Risk Factors/Patient Goals on Admission   Increase  Strength and Stamina Yes   Intervention Provide advice, education, support and counseling about physical activity/exercise needs.;Develop an individualized exercise prescription for aerobic and resistive training based on initial evaluation findings, risk stratification, comorbidities and participant's personal goals.   Expected Outcomes Achievement of increased cardiorespiratory fitness and enhanced flexibility, muscular endurance and strength shown through measurements of functional capacity and personal statement of participant.   Diabetes Yes   Intervention Provide education about signs/symptoms and action to take for hypo/hyperglycemia.;Provide education about proper nutrition, including hydration, and aerobic/resistive exercise prescription along with prescribed medications to achieve blood glucose in normal ranges: Fasting glucose 65-99 mg/dL   Expected Outcomes Short Term: Participant verbalizes understanding of the signs/symptoms and immediate care of hyper/hypoglycemia, proper foot care and importance of medication, aerobic/resistive exercise and nutrition plan for blood glucose control.;Long Term: Attainment of HbA1C < 7%.   Hypertension Yes   Intervention Provide education on lifestyle modifcations including regular physical activity/exercise, weight management, moderate sodium restriction and increased consumption of fresh fruit, vegetables, and low fat dairy, alcohol moderation, and smoking cessation.;Monitor prescription use compliance.   Expected Outcomes Short Term: Continued assessment and intervention until BP is < 140/71m HG in hypertensive participants. < 130/825mHG in hypertensive participants with diabetes, heart failure or chronic kidney disease.;Long Term: Maintenance of blood pressure at goal levels.   Lipids Yes   Intervention Provide education and support for participant on nutrition & aerobic/resistive exercise along with prescribed medications to achieve LDL <7069mHDL  >7m7m Expected  Outcomes Short Term: Participant states understanding of desired cholesterol values and is compliant with medications prescribed. Participant is following exercise prescription and nutrition guidelines.;Long Term: Cholesterol controlled with medications as prescribed, with individualized exercise RX and with personalized nutrition plan. Value goals: LDL < 31m, HDL > 40 mg.      Core Components/Risk Factors/Patient Goals Review:      Goals and Risk Factor Review    Row Name 05/26/16 0752 06/16/16 1229 07/24/16 0908         Core Components/Risk Factors/Patient Goals Review   Personal Goals Review Hypertension;Diabetes;Lipids Diabetes;Hypertension;Lipids  -     Review Rays BG has been within normal limits pre and post exercise so far.  He met with the RD and feels he can better put together proper portions of carbs, fats and proteins.  She also discussed low sodium diet with Ray. Ray does not monitor his BG at home.  He was not clear if he is diabetic or pre diabetic.  SCollie Siadwill follow up and talk to [place]  He is taking all meds as directed for HTN and lipids.   His blood sugars have been good. Has knee and back problem and shoulder problems. He wants to cont to exercise. I mentioned about physical therapy free screening.  His blood pressure is staying good. " I try to do the right thing if I can".      Expected Outcomes Ray will continue to have BG within normal limits and see improvement in overall health from dietary changes. Ray will confirm his status with BG with his Dr. and continue to take meds as directed.  He will continue to keep his risk factors under control and reduce the risk of further cardiovascular events.  -        Core Components/Risk Factors/Patient Goals at Discharge (Final Review):      Goals and Risk Factor Review - 07/24/16 0908      Core Components/Risk Factors/Patient Goals Review   Review His blood sugars have been good. Has knee and back problem  and shoulder problems. He wants to cont to exercise. I mentioned about physical therapy free screening.  His blood pressure is staying good. " I try to do the right thing if I can".       ITP Comments:     ITP Comments    Row Name 05/11/16 1346 05/11/16 1347 05/18/16 1311 06/01/16 1114 06/02/16 0848   ITP Comments Ray wishes to have his girlfriend who had a heart attack 4 months ago join him in Cardiac Rehab since he said he believes it will help both of them alot.  Individual appt made with the Cardiac Rehab Registered dietician since [place] is one of his main concerns to eat healthier. He believes he is eating healthier not but wants to see if RD has any other suggestions. Mr HFuellinghas not attended a Heart Ttrack class as of 05/18/16. Ray has done well with exercise. RRamiecalled yesterday and said he was sorry that he couldn't attend Cardiac Rehab yesterday.    RBullittName 06/07/16 0702 07/05/16 0700         ITP Comments 30 day review. Continue with ITP unless changes noted by Medical Director at signature of review. 30 day review. Continue with ITP unless changes noted by Medical Director at signature of review.          Comments: discharged

## 2016-07-28 ENCOUNTER — Encounter: Payer: Self-pay | Admitting: Physician Assistant

## 2016-07-28 ENCOUNTER — Telehealth: Payer: Self-pay | Admitting: Cardiovascular Disease

## 2016-07-28 ENCOUNTER — Telehealth: Payer: Self-pay | Admitting: *Deleted

## 2016-07-28 DIAGNOSIS — I10 Essential (primary) hypertension: Secondary | ICD-10-CM

## 2016-07-28 LAB — HEMOGLOBIN A1C
Hgb A1c MFr Bld: 6.7 % — ABNORMAL HIGH (ref <5.7)
Mean Plasma Glucose: 146 mg/dL

## 2016-07-28 NOTE — Telephone Encounter (Signed)
Lmtcb to go over echo and lab results

## 2016-07-28 NOTE — Telephone Encounter (Signed)
Follow Up:; ° ° °Returning your call. °

## 2016-07-28 NOTE — Telephone Encounter (Signed)
New message ° ° ° ° ° ° ° ° °Pt returning call for test results  °

## 2016-07-28 NOTE — Telephone Encounter (Signed)
Pt notified of both lab and echo results by phone with verbal understanding. I will fax results to PCP as well.

## 2016-07-28 NOTE — Telephone Encounter (Signed)
Notes Recorded by Sherren Mocha, MD on 07/28/2016 at 9:44 AM EDT Chronic kidney disease noted. Hemoglobin A1c is improved from previous. Ramipril dose was increased at his office visit. He should have a follow-up metabolic panel in 2 weeks to make sure that his creatinine is not continuing to rise    I spoke with the pt by phone and made him aware of lab results.  The pt will have a repeat BMP on 08/10/16.

## 2016-08-02 ENCOUNTER — Ambulatory Visit: Payer: Self-pay | Attending: Cardiovascular Disease | Admitting: Physical Therapy

## 2016-08-04 ENCOUNTER — Telehealth: Payer: Self-pay

## 2016-08-04 NOTE — Telephone Encounter (Signed)
Tier exception for Brilinta 90mg  submitted to [facility].

## 2016-08-07 NOTE — Telephone Encounter (Signed)
From: Brett Canales, LPN  Sent: [id number]  2:52 PM  To: Barkley Boards, RN  Subject: RE: Tier exception                Waneta Martins has granted him a Tier exception.  He should get a letter about this.  I can't tell you how much it will be.   Vaughan Basta

## 2016-08-10 ENCOUNTER — Other Ambulatory Visit: Payer: Commercial Managed Care - HMO | Admitting: *Deleted

## 2016-08-10 DIAGNOSIS — I10 Essential (primary) hypertension: Secondary | ICD-10-CM

## 2016-08-10 LAB — BASIC METABOLIC PANEL
BUN: 14 mg/dL (ref 7–25)
CHLORIDE: 107 mmol/L (ref 98–110)
CO2: 25 mmol/L (ref 20–31)
Calcium: 9.1 mg/dL (ref 8.6–10.3)
Creat: 1.47 mg/dL — ABNORMAL HIGH (ref 0.70–1.18)
Glucose, Bld: 151 mg/dL — ABNORMAL HIGH (ref 65–99)
POTASSIUM: 3.9 mmol/L (ref 3.5–5.3)
Sodium: 139 mmol/L (ref 135–146)

## 2016-08-15 ENCOUNTER — Inpatient Hospital Stay
Admission: EM | Admit: 2016-08-15 | Discharge: 2016-08-16 | DRG: 446 | Disposition: A | Discharge status: Home / Self Care | Payer: Commercial Managed Care - HMO | Attending: Surgery | Admitting: Surgery

## 2016-08-15 ENCOUNTER — Ambulatory Visit: Payer: Self-pay | Admitting: Physical Therapy

## 2016-08-15 ENCOUNTER — Emergency Department: Payer: Commercial Managed Care - HMO

## 2016-08-15 ENCOUNTER — Other Ambulatory Visit: Payer: Self-pay

## 2016-08-15 ENCOUNTER — Encounter: Payer: Self-pay | Admitting: Emergency Medicine

## 2016-08-15 ENCOUNTER — Emergency Department (HOSPITAL_BASED_OUTPATIENT_CLINIC_OR_DEPARTMENT_OTHER)
Admit: 2016-08-15 | Discharge: 2016-08-15 | Disposition: A | Discharge status: Home / Self Care | Payer: Commercial Managed Care - HMO | Attending: Emergency Medicine | Admitting: Emergency Medicine

## 2016-08-15 DIAGNOSIS — E119 Type 2 diabetes mellitus without complications: Secondary | ICD-10-CM | POA: Diagnosis present

## 2016-08-15 DIAGNOSIS — Z882 Allergy status to sulfonamides status: Secondary | ICD-10-CM | POA: Diagnosis not present

## 2016-08-15 DIAGNOSIS — I255 Ischemic cardiomyopathy: Secondary | ICD-10-CM | POA: Diagnosis not present

## 2016-08-15 DIAGNOSIS — I252 Old myocardial infarction: Secondary | ICD-10-CM

## 2016-08-15 DIAGNOSIS — E785 Hyperlipidemia, unspecified: Secondary | ICD-10-CM | POA: Diagnosis not present

## 2016-08-15 DIAGNOSIS — I2510 Atherosclerotic heart disease of native coronary artery without angina pectoris: Secondary | ICD-10-CM | POA: Diagnosis present

## 2016-08-15 DIAGNOSIS — R109 Unspecified abdominal pain: Secondary | ICD-10-CM

## 2016-08-15 DIAGNOSIS — R1011 Right upper quadrant pain: Secondary | ICD-10-CM | POA: Diagnosis not present

## 2016-08-15 DIAGNOSIS — Z955 Presence of coronary angioplasty implant and graft: Secondary | ICD-10-CM | POA: Diagnosis not present

## 2016-08-15 DIAGNOSIS — K819 Cholecystitis, unspecified: Secondary | ICD-10-CM

## 2016-08-15 DIAGNOSIS — K810 Acute cholecystitis: Secondary | ICD-10-CM | POA: Diagnosis not present

## 2016-08-15 DIAGNOSIS — R079 Chest pain, unspecified: Secondary | ICD-10-CM | POA: Diagnosis not present

## 2016-08-15 DIAGNOSIS — M549 Dorsalgia, unspecified: Secondary | ICD-10-CM

## 2016-08-15 DIAGNOSIS — K8020 Calculus of gallbladder without cholecystitis without obstruction: Secondary | ICD-10-CM | POA: Diagnosis not present

## 2016-08-15 DIAGNOSIS — Z8249 Family history of ischemic heart disease and other diseases of the circulatory system: Secondary | ICD-10-CM

## 2016-08-15 DIAGNOSIS — E7800 Pure hypercholesterolemia, unspecified: Secondary | ICD-10-CM | POA: Diagnosis present

## 2016-08-15 DIAGNOSIS — Z886 Allergy status to analgesic agent status: Secondary | ICD-10-CM | POA: Diagnosis not present

## 2016-08-15 DIAGNOSIS — I10 Essential (primary) hypertension: Secondary | ICD-10-CM | POA: Diagnosis present

## 2016-08-15 DIAGNOSIS — Z87891 Personal history of nicotine dependence: Secondary | ICD-10-CM | POA: Diagnosis not present

## 2016-08-15 DIAGNOSIS — R0602 Shortness of breath: Secondary | ICD-10-CM | POA: Diagnosis not present

## 2016-08-15 HISTORY — DX: Cholecystitis, unspecified: K81.9

## 2016-08-15 LAB — HEPATIC FUNCTION PANEL
ALBUMIN: 4.2 g/dL (ref 3.5–5.0)
ALK PHOS: 79 U/L (ref 38–126)
ALT: 20 U/L (ref 17–63)
AST: 21 U/L (ref 15–41)
BILIRUBIN INDIRECT: 0.8 mg/dL (ref 0.3–0.9)
BILIRUBIN TOTAL: 1 mg/dL (ref 0.3–1.2)
Bilirubin, Direct: 0.2 mg/dL (ref 0.1–0.5)
Total Protein: 7.6 g/dL (ref 6.5–8.1)

## 2016-08-15 LAB — CBC WITH DIFFERENTIAL/PLATELET
Basophils Absolute: 0.1 10*3/uL (ref 0–0.1)
Basophils Relative: 1 %
EOS ABS: 0.3 10*3/uL (ref 0–0.7)
EOS PCT: 3 %
HCT: 41 % (ref 40.0–52.0)
Hemoglobin: 14.2 g/dL (ref 13.0–18.0)
LYMPHS ABS: 1.6 10*3/uL (ref 1.0–3.6)
Lymphocytes Relative: 14 %
MCH: 29.3 pg (ref 26.0–34.0)
MCHC: 34.7 g/dL (ref 32.0–36.0)
MCV: 84.6 fL (ref 80.0–100.0)
MONO ABS: 0.5 10*3/uL (ref 0.2–1.0)
MONOS PCT: 5 %
Neutro Abs: 8.8 10*3/uL — ABNORMAL HIGH (ref 1.4–6.5)
Neutrophils Relative %: 77 %
PLATELETS: 262 10*3/uL (ref 150–440)
RBC: 4.84 MIL/uL (ref 4.40–5.90)
RDW: 14.2 % (ref 11.5–14.5)
WBC: 11.3 10*3/uL — ABNORMAL HIGH (ref 3.8–10.6)

## 2016-08-15 LAB — BASIC METABOLIC PANEL
Anion gap: 9 (ref 5–15)
BUN: 23 mg/dL — AB (ref 6–20)
CO2: 22 mmol/L (ref 22–32)
CREATININE: 1.63 mg/dL — AB (ref 0.61–1.24)
Calcium: 9.4 mg/dL (ref 8.9–10.3)
Chloride: 106 mmol/L (ref 101–111)
GFR calc Af Amer: 47 mL/min — ABNORMAL LOW (ref >60)
GFR, EST NON AFRICAN AMERICAN: 41 mL/min — AB (ref >60)
Glucose, Bld: 272 mg/dL — ABNORMAL HIGH (ref 65–99)
Potassium: 4.2 mmol/L (ref 3.5–5.1)
SODIUM: 137 mmol/L (ref 135–145)

## 2016-08-15 LAB — TROPONIN I: Troponin I: <0.03 ng/mL (ref <0.03)

## 2016-08-15 LAB — ECHOCARDIOGRAM COMPLETE
Height: 67 in
WEIGHTICAEL: 2880 [oz_av]

## 2016-08-15 LAB — LIPASE, BLOOD: Lipase: 46 U/L (ref 11–51)

## 2016-08-15 LAB — BRAIN NATRIURETIC PEPTIDE: B Natriuretic Peptide: 21 pg/mL (ref 0.0–100.0)

## 2016-08-15 LAB — GLUCOSE, CAPILLARY: Glucose-Capillary: 117 mg/dL — ABNORMAL HIGH (ref 65–99)

## 2016-08-15 MED ORDER — PIPERACILLIN-TAZOBACTAM 3.375 G IVPB 30 MIN
3.3750 g | Freq: Once | INTRAVENOUS | Status: AC
  Administered 2016-08-15: 3.375 g via INTRAVENOUS
  Filled 2016-08-15: qty 50

## 2016-08-15 MED ORDER — INSULIN ASPART 100 UNIT/ML ~~LOC~~ SOLN
0.0000 [IU] | Freq: Every day | SUBCUTANEOUS | Status: DC
Start: 2016-08-15 — End: 2016-08-16

## 2016-08-15 MED ORDER — SODIUM CHLORIDE 0.9 % IV SOLN
Freq: Once | INTRAVENOUS | Status: AC
  Administered 2016-08-15: 14:00:00 via INTRAVENOUS

## 2016-08-15 MED ORDER — METOPROLOL TARTRATE 25 MG PO TABS
25.0000 mg | ORAL_TABLET | Freq: Two times a day (BID) | ORAL | Status: DC
  Administered 2016-08-15 – 2016-08-16 (×3): 25 mg via ORAL
  Filled 2016-08-15 (×3): qty 1

## 2016-08-15 MED ORDER — MORPHINE SULFATE (PF) 4 MG/ML IV SOLN
INTRAVENOUS | Status: AC
  Administered 2016-08-15: 4 mg via INTRAVENOUS
  Filled 2016-08-15: qty 1

## 2016-08-15 MED ORDER — MORPHINE SULFATE (PF) 4 MG/ML IV SOLN
4.0000 mg | Freq: Once | INTRAVENOUS | Status: AC
  Administered 2016-08-15: 4 mg via INTRAVENOUS

## 2016-08-15 MED ORDER — NITROGLYCERIN 0.4 MG SL SUBL: 0.4000 mg | SUBLINGUAL_TABLET | SUBLINGUAL | Status: DC | PRN

## 2016-08-15 MED ORDER — HEPARIN SODIUM (PORCINE) 5000 UNIT/ML IJ SOLN
5000.0000 [IU] | Freq: Three times a day (TID) | INTRAMUSCULAR | Status: DC
  Administered 2016-08-15 – 2016-08-16 (×2): 5000 [IU] via SUBCUTANEOUS
  Filled 2016-08-15 (×2): qty 1

## 2016-08-15 MED ORDER — SODIUM CHLORIDE 0.9 % IV SOLN
3.0000 g | Freq: Four times a day (QID) | INTRAVENOUS | Status: DC
  Administered 2016-08-15 – 2016-08-16 (×4): 3 g via INTRAVENOUS
  Filled 2016-08-15 (×7): qty 3

## 2016-08-15 MED ORDER — SODIUM CHLORIDE 0.9 % IV BOLUS (SEPSIS)
500.0000 mL | Freq: Once | INTRAVENOUS | Status: AC
  Administered 2016-08-15: 500 mL via INTRAVENOUS

## 2016-08-15 MED ORDER — AMLODIPINE BESYLATE 10 MG PO TABS
10.0000 mg | ORAL_TABLET | Freq: Every day | ORAL | Status: DC
  Administered 2016-08-15 – 2016-08-16 (×2): 10 mg via ORAL
  Filled 2016-08-15: qty 2
  Filled 2016-08-15: qty 1

## 2016-08-15 MED ORDER — MORPHINE SULFATE (PF) 4 MG/ML IV SOLN: 2.0000 mg | INTRAVENOUS | Status: DC | PRN

## 2016-08-15 MED ORDER — INSULIN ASPART 100 UNIT/ML ~~LOC~~ SOLN
0.0000 [IU] | Freq: Three times a day (TID) | SUBCUTANEOUS | Status: DC
  Filled 2016-08-15: qty 2

## 2016-08-15 MED ORDER — MORPHINE SULFATE (PF) 4 MG/ML IV SOLN
4.0000 mg | Freq: Once | INTRAVENOUS | Status: AC
  Administered 2016-08-15: 4 mg via INTRAVENOUS
  Filled 2016-08-15: qty 1

## 2016-08-15 MED ORDER — NITROGLYCERIN 0.4 MG SL SUBL
0.4000 mg | SUBLINGUAL_TABLET | SUBLINGUAL | Status: DC | PRN
  Administered 2016-08-15: 0.4 mg via SUBLINGUAL
  Filled 2016-08-15: qty 1

## 2016-08-15 MED ORDER — TICAGRELOR 90 MG PO TABS
90.0000 mg | ORAL_TABLET | Freq: Two times a day (BID) | ORAL | Status: DC
  Administered 2016-08-15 – 2016-08-16 (×3): 90 mg via ORAL
  Filled 2016-08-15 (×6): qty 1

## 2016-08-15 MED ORDER — ASPIRIN EC 81 MG PO TBEC
81.0000 mg | DELAYED_RELEASE_TABLET | Freq: Every day | ORAL | Status: DC
  Administered 2016-08-15 – 2016-08-16 (×2): 81 mg via ORAL
  Filled 2016-08-15 (×2): qty 1

## 2016-08-15 MED ORDER — HYDROCODONE-ACETAMINOPHEN 5-325 MG PO TABS: 1.0000 | ORAL_TABLET | ORAL | Status: DC | PRN

## 2016-08-15 MED ORDER — RAMIPRIL 5 MG PO CAPS
5.0000 mg | ORAL_CAPSULE | Freq: Every day | ORAL | Status: DC
  Administered 2016-08-15 – 2016-08-16 (×2): 5 mg via ORAL
  Filled 2016-08-15 (×3): qty 1

## 2016-08-15 MED ORDER — ATORVASTATIN CALCIUM 20 MG PO TABS
80.0000 mg | ORAL_TABLET | Freq: Every day | ORAL | Status: DC
  Administered 2016-08-15: 80 mg via ORAL
  Filled 2016-08-15: qty 4

## 2016-08-15 MED ORDER — TEMAZEPAM 15 MG PO CAPS: 15.0000 mg | ORAL_CAPSULE | Freq: Every evening | ORAL | Status: DC | PRN

## 2016-08-15 MED ORDER — TRAMADOL HCL 50 MG PO TABS: 50.0000 mg | ORAL_TABLET | Freq: Four times a day (QID) | ORAL | Status: DC | PRN

## 2016-08-15 NOTE — ED Provider Notes (Signed)
[facility] Emergency Department Provider Note  ____________________________________________  Time seen: Approximately 7:53 AM  I have reviewed the triage vital signs and the nursing notes.   HISTORY  Chief Complaint Chest Pain   HPI Gilbert Obrien is a 70 y.o. male with h/o CAD s/p stents in 03/2016, HTN, HLD, DM who presents for evaluation of epigastric pain. Patient reports that he woke up at 1 AM with burning pain located in his epigastric region. He thought the pain was due to reflux. Throughout the night the pain got progressively worse. This morning he reports that the pain is severe, 10 out of 10, dull and burning in quality, located in his epigastric region, radiating to his back and his chest. He reports one short-lived episode of shortness of breath and dizziness associated with it. Patient has been having normal bowel movements, no nausea or vomiting, no fever or chills, no coughing, no shortness of breath, no dysuria or hematuria. No numbness or weakness of his extremities. Patient reports pain is much different than prior heart attack. Took full dose ASA this morning. No NSAIDS, no melena, no h/o PUD.  Past Medical History:  Diagnosis Date  . Arthritis   . Coronary artery disease    a. STEMI 03/2016 DESx1 to LCx, DES x 1 Mid LAD, DES x1 prox RCA  . Depression   . Diabetes mellitus without complication ([place])    type 2  . History of acute inferior wall myocardial infarction 04/15/2016   inf-lat/post >> PCI with DES of LCx; staged PCI of LAD and RCA  . Hypercholesteremia   . Hypertension   . Ischemic cardiomyopathy 04/19/2016   A. Inf-lat/post STEMI 7/17 >> b. Echo 04/17/16: Septal and post lateral HK, poor image quality, EF 35-40% // b. Echo 11/17: mild LVH, EF 50-55, inf-lat and ant-lat HK, Gr 1 DD, borderline dilated aortic root (37 mm), MAC    Patient Active Problem List   Diagnosis Date Noted  . Cholecystitis 08/15/2016  . Essential  hypertension 05/02/2016  . Coronary artery disease involving native heart without angina pectoris 05/01/2016  . HLD (hyperlipidemia) 05/01/2016  . Hypokalemia 04/19/2016  . Cardiomyopathy, ischemic 04/19/2016  . Diabetes mellitus ([place]) 04/19/2016  . CKD (chronic kidney disease) stage 3, GFR 30-59 ml/min 04/19/2016  . Hematoma of arm 04/19/2016  . ST elevation (STEMI) myocardial infarction involving left circumflex coronary artery ([place]) 04/15/2016    Past Surgical History:  Procedure Laterality Date  . CARDIAC CATHETERIZATION N/A 04/15/2016   Procedure: Left Heart Cath and Coronary Angiography;  Surgeon: Sherren Mocha, MD;  Location: [place] [facility];  Service: Cardiovascular;  Laterality: N/A;  . CARDIAC CATHETERIZATION N/A 04/15/2016   Procedure: Coronary Stent Intervention;  Surgeon: Sherren Mocha, MD;  Location: [place] [facility];  Service: Cardiovascular;  Laterality: N/A;  . CARDIAC CATHETERIZATION N/A 04/17/2016   Procedure: Coronary Stent Intervention;  Surgeon: Burnell Blanks, MD;  Location: [place] [facility];  Service: Cardiovascular;  Laterality: N/A;  . CARDIAC CATHETERIZATION N/A 04/18/2016   Procedure: Coronary Stent Intervention;  Surgeon: Burnell Blanks, MD;  Location: [place] [facility];  Service: Cardiovascular;  Laterality: N/A;  . CARDIAC CATHETERIZATION N/A 04/18/2016   Procedure: Temporary Pacemaker;  Surgeon: Burnell Blanks, MD;  Location: [place] [facility];  Service: Cardiovascular;  Laterality: N/A;  . CORONARY STENT PLACEMENT  04/17/2016    Severe stenosis proximal LAD, now s/p successful PTCA/DES x 1 proximal and mid LAD  . COSMETIC  SURGERY  1974   right side facial       . SHOULDER SURGERY      Prior to Admission medications   Medication Sig Start Date End Date Taking? Authorizing Provider  amLODipine (NORVASC) 10 MG tablet Take 1 tablet (10 mg total) by mouth daily. 04/19/16  Yes Arbutus Leas, NP  aspirin EC 81 MG EC tablet Take 1  tablet (81 mg total) by mouth daily. 04/19/16  Yes Arbutus Leas, NP  atorvastatin (LIPITOR) 80 MG tablet Take 1 tablet (80 mg total) by mouth daily at 6 PM. 04/19/16  Yes Arbutus Leas, NP  metoprolol tartrate (LOPRESSOR) 25 MG tablet Take 1 tablet (25 mg total) by mouth 2 (two) times daily. 04/19/16  Yes Arbutus Leas, NP  nitroGLYCERIN (NITROSTAT) 0.4 MG SL tablet Place 1 tablet (0.4 mg total) under the tongue every 5 (five) minutes x 3 doses as needed for chest pain. 04/19/16  Yes Arbutus Leas, NP  ramipril (ALTACE) 5 MG capsule Take 1 capsule (5 mg total) by mouth daily. 07/27/16  Yes Sherren Mocha, MD  temazepam (RESTORIL) 15 MG capsule Take 15 mg by mouth at bedtime as needed for sleep.   Yes Historical Provider, MD  ticagrelor (BRILINTA) 90 MG TABS tablet Take 1 tablet (90 mg total) by mouth 2 (two) times daily. 04/19/16  Yes Arbutus Leas, NP  traMADol (ULTRAM) 50 MG tablet Take 50 mg by mouth every 6 (six) hours as needed for moderate pain.   Yes Historical Provider, MD    Allergies Atorvastatin; Celecoxib; and Sulfa antibiotics  Family History  Problem Relation Age of Onset  . Heart attack Brother   . Heart disease Brother     Social History Social History  Substance Use Topics  . Smoking status: Former Smoker    Quit date: 09/25/2000  . Smokeless tobacco: Never Used  . Alcohol use No    Review of Systems  Constitutional: Negative for fever. Eyes: Negative for visual changes. ENT: Negative for sore throat. Neck: No neck pain  Cardiovascular: + chest pain. Respiratory: Negative for shortness of breath. Gastrointestinal: + epigastric abdominal pain. No vomiting or diarrhea. Genitourinary: Negative for dysuria. Musculoskeletal: Negative for back pain. Skin: Negative for rash. Neurological: Negative for headaches, weakness or numbness. Psych: No SI or HI  ____________________________________________   PHYSICAL EXAM:  VITAL SIGNS: ED Triage Vitals  Enc Vitals Group      BP 08/15/16 0749 (!) 173/79     Pulse Rate 08/15/16 0749 64     Resp 08/15/16 0749 16     Temp 08/15/16 0749 97.4 F (36.3 C)     Temp Source 08/15/16 0749 Oral     SpO2 08/15/16 0749 100 %     Weight 08/15/16 0750 180 lb (81.6 kg)     Height 08/15/16 0750 5\' 7"  (1.702 m)     Head Circumference --      Peak Flow --      Pain Score 08/15/16 0750 8     Pain Loc --      Pain Edu? --      Excl. in [place]? --     Constitutional: Alert and oriented. Well appearing and in no apparent distress. HEENT:      Head: Normocephalic and atraumatic.         Eyes: Conjunctivae are normal. Sclera is non-icteric. EOMI. PERRL      Mouth/Throat: Mucous membranes are moist.  Neck: Supple with no signs of meningismus. Cardiovascular: Regular rate and rhythm. No murmurs, gallops, or rubs. 2+ symmetrical distal pulses are present in all extremities. No JVD. Respiratory: Normal respiratory effort. Lungs are clear to auscultation bilaterally. No wheezes, crackles, or rhonchi.  Gastrointestinal: Soft, non tender, and non distended with positive bowel sounds. No rebound or guarding. Genitourinary: No CVA tenderness. Musculoskeletal: Nontender with normal range of motion in all extremities. No edema, cyanosis, or erythema of extremities. Neurologic: Normal speech and language. Face is symmetric. Moving all extremities. No gross focal neurologic deficits are appreciated. Skin: Skin is warm, dry and intact. No rash noted. Psychiatric: Mood and affect are normal. Speech and behavior are normal.  ____________________________________________   LABS (all labs ordered are listed, but only abnormal results are displayed)  Labs Reviewed  CBC WITH DIFFERENTIAL/PLATELET - Abnormal; Notable for the following:       Result Value   WBC 11.3 (*)    Neutro Abs 8.8 (*)    All other components within normal limits  BASIC METABOLIC PANEL - Abnormal; Notable for the following:    Glucose, Bld 272 (*)    BUN 23 (*)     Creatinine, Ser 1.63 (*)    GFR calc non Af Amer 41 (*)    GFR calc Af Amer 47 (*)    All other components within normal limits  TROPONIN I  BRAIN NATRIURETIC PEPTIDE  LIPASE, BLOOD  HEPATIC FUNCTION PANEL  TROPONIN I   ____________________________________________  EKG  ED ECG REPORT I, Rudene Re, the attending physician, personally viewed and interpreted this ECG.  Normal sinus rhythm, rate of 61, normal intervals, normal axis, no ST elevations or depressions, Q waves present on inferior leads. Unchanged from prior   8:21AM - normal sinus rhythm, rate of 56, normal intervals, normal axis, no ST elevations or depressions. Unchanged from prior ____________________________________________  RADIOLOGY  CXR: Negative ____________________________________________   PROCEDURES  Procedure(s) performed: None Procedures Critical Care performed:  Yes  CRITICAL CARE Performed by: Rudene Re  ?  Total critical care time: 40 min  Critical care time was exclusive of separately billable procedures and treating other patients.  Critical care was necessary to treat or prevent imminent or life-threatening deterioration.  Critical care was time spent personally by me on the following activities: development of treatment plan with patient and/or surrogate as well as nursing, discussions with consultants, evaluation of patient's response to treatment, examination of patient, obtaining history from patient or surrogate, ordering and performing treatments and interventions, ordering and review of laboratory studies, ordering and review of radiographic studies, pulse oximetry and re-evaluation of patient's condition.  ____________________________________________   INITIAL IMPRESSION / ASSESSMENT AND PLAN / ED COURSE  70 y.o. male with h/o CAD s/p stents in 03/2016, HTN, HLD, DM who presents for evaluation of epigastric pain radiating to his chest and back that has been present  since 1 AM. Abdominal exam with no tenderness throughout. EKG 2 with no ischemic changes. First troponin is negative. Patient took aspirin at home before coming in. Patient received nitroglycerin and 2 rounds of morphine with improvement of the pain. I have had a long discussion with patient about concerns for possible dissection. Patient has CKD stage III and has refused CT scan because he says that he does not want to go into dialysis. I have explained to the patient that if he does have a dissection he may die or become severely sick from this condition. Patient understands it. I have  paged the echo lab stat for bedside echocardiogram.  Clinical Course as of Aug 15 1345  Tue Aug 15, 2016  1015 CT from 2015 showing cholelithiasis. Patient has no tenderness on abdominal exam and normal LFTs, T bili, lipase. I did a bedside ultrasound to look at patient's aorta. Exam is limited due to body habitus but no free fluid was seen and no dilated aorta. Echo has been done and according to the tech, no abnormalities were seen. I discussed patient's presentation, exam, lab work, and imaging with Dr. Candis Musa who will look at patient's ECHO and call me back. Will get formal RUQ [place] and aorta US as patient continues to be adamantly against contrast. Patient's pain is improved after morphine x2.  [CV]  [id number] ECHO looks normal per Dr. Candis Musa with normal wall motion, no pericardial fluid, aorta looks normal.  [CV]  [id number] [place] concerning for acute cholecystitis. Will give zosyn. Dr. [name] consulted for further management.  [CV]    Clinical Course User Index [CV] Rudene Re, MD    Pertinent labs & imaging results that were available during my care of the patient were reviewed by me and considered in my medical decision making (see chart for details).    ____________________________________________   FINAL CLINICAL IMPRESSION(S) / ED DIAGNOSES  Final diagnoses:  Abdominal pain  Cholecystitis      NEW  MEDICATIONS STARTED DURING THIS VISIT:  New Prescriptions   No medications on file     Note:  This document was prepared using Dragon voice recognition software and may include unintentional dictation errors.    Rudene Re, MD 08/15/16 1346

## 2016-08-15 NOTE — Consult Note (Addendum)
[place] @ [facility] Medical Consultation Note Gilbert Obrien, D.O.  ---------------------------------------------------------------------------------------------------------------------   PATIENT NAME: Gilbert Obrien MR#: [id number] DATE OF BIRTH: Sep 27, 1945 DATE OF ADMISSION: 08/15/2016 PRIMARY CARE PHYSICIAN: [name], MD  REQUESTING/REFERRING PHYSICIAN:  Dr. [name]  CHIEF COMPLAINT: Chief Complaint  Patient presents with  . Chest Pain    HISTORY OF PRESENT ILLNESS: Gilbert Obrien is a 70 y.o. male with a known history of DM, HTN, HLD, Ischemic cardiomyopathy status post STEMI with stents in July 2017 on Brilinta, depression and arthritis who is admitted through the emergency department I general surgeon Dr. [name] for diagnosis of acute cholecystitis. Given his recent cardiac events conservative nonsurgical management including cholecystostomy tube is preferred over laparoscopic cholecystectomy. He was admitted by Dr. [name] for IV hydration, pain control, antiemetics and antibiotics. Medical consultation was requested for comanagement of medical comorbidities.   Patient states He was diagnosed with diabetes, started on metformin and lost about 20 pounds after which his blood glucose has stabilized. He was taken off metformin. He reports that he has been taking medication as prescribed and there has been no recent change in medication or diet.  There has been no recent illness, travel or sick contacts.    Patient denies fevers/chills, weakness, dizziness, chest pain, shortness of breath,  dysuria/frequency, changes in mental status.   PAST MEDICAL HISTORY: Past Medical History:  Diagnosis Date  . Arthritis   . Coronary artery disease    a. STEMI 03/2016 DESx1 to LCx, DES x 1 Mid LAD, DES x1 prox RCA  . Depression   . Diabetes mellitus without complication ([place])    type 2  . History of acute inferior wall myocardial infarction 04/15/2016   inf-lat/post  >> PCI with DES of LCx; staged PCI of LAD and RCA  . Hypercholesteremia   . Hypertension   . Ischemic cardiomyopathy 04/19/2016   A. Inf-lat/post STEMI 7/17 >> b. Echo 04/17/16: Septal and post lateral HK, poor image quality, EF 35-40% // b. Echo 11/17: mild LVH, EF 50-55, inf-lat and ant-lat HK, Gr 1 DD, borderline dilated aortic root (37 mm), MAC      PAST SURGICAL HISTORY: Past Surgical History:  Procedure Laterality Date  . CARDIAC CATHETERIZATION N/A 04/15/2016   Procedure: Left Heart Cath and Coronary Angiography;  Surgeon: Sherren Mocha, MD;  Location: [place] [facility];  Service: Cardiovascular;  Laterality: N/A;  . CARDIAC CATHETERIZATION N/A 04/15/2016   Procedure: Coronary Stent Intervention;  Surgeon: Sherren Mocha, MD;  Location: [place] [facility];  Service: Cardiovascular;  Laterality: N/A;  . CARDIAC CATHETERIZATION N/A 04/17/2016   Procedure: Coronary Stent Intervention;  Surgeon: Burnell Blanks, MD;  Location: [place] [facility];  Service: Cardiovascular;  Laterality: N/A;  . CARDIAC CATHETERIZATION N/A 04/18/2016   Procedure: Coronary Stent Intervention;  Surgeon: Burnell Blanks, MD;  Location: [place] [facility];  Service: Cardiovascular;  Laterality: N/A;  . CARDIAC CATHETERIZATION N/A 04/18/2016   Procedure: Temporary Pacemaker;  Surgeon: Burnell Blanks, MD;  Location: [place] [facility];  Service: Cardiovascular;  Laterality: N/A;  . CORONARY STENT PLACEMENT  04/17/2016    Severe stenosis proximal LAD, now s/p successful PTCA/DES x 1 proximal and mid LAD  . COSMETIC SURGERY  1974   right side facial       . SHOULDER SURGERY        SOCIAL HISTORY: Social History  Substance Use Topics  . Smoking status: Former Smoker    Quit date:  09/25/2000  . Smokeless tobacco: Never Used  . Alcohol use No      FAMILY HISTORY: Family History  Problem Relation Age of Onset  . Heart attack Brother   . Heart disease Brother      MEDICATIONS AT  HOME: Prior to Admission medications   Medication Sig Start Date End Date Taking? Authorizing Provider  amLODipine (NORVASC) 10 MG tablet Take 1 tablet (10 mg total) by mouth daily. 04/19/16  Yes Arbutus Leas, NP  aspirin EC 81 MG EC tablet Take 1 tablet (81 mg total) by mouth daily. 04/19/16  Yes Arbutus Leas, NP  atorvastatin (LIPITOR) 80 MG tablet Take 1 tablet (80 mg total) by mouth daily at 6 PM. 04/19/16  Yes Arbutus Leas, NP  metoprolol tartrate (LOPRESSOR) 25 MG tablet Take 1 tablet (25 mg total) by mouth 2 (two) times daily. 04/19/16  Yes Arbutus Leas, NP  nitroGLYCERIN (NITROSTAT) 0.4 MG SL tablet Place 1 tablet (0.4 mg total) under the tongue every 5 (five) minutes x 3 doses as needed for chest pain. 04/19/16  Yes Arbutus Leas, NP  ramipril (ALTACE) 5 MG capsule Take 1 capsule (5 mg total) by mouth daily. 07/27/16  Yes Sherren Mocha, MD  temazepam (RESTORIL) 15 MG capsule Take 15 mg by mouth at bedtime as needed for sleep.   Yes Historical Provider, MD  ticagrelor (BRILINTA) 90 MG TABS tablet Take 1 tablet (90 mg total) by mouth 2 (two) times daily. 04/19/16  Yes Arbutus Leas, NP  traMADol (ULTRAM) 50 MG tablet Take 50 mg by mouth every 6 (six) hours as needed for moderate pain.   Yes Historical Provider, MD      DRUG ALLERGIES: Allergies  Allergen Reactions  . Atorvastatin Other (See Comments)    Myalgia.  Onset 12/27/2005.  Marland Kitchen Celecoxib Anaphylaxis    Swelling in throat.  . Sulfa Antibiotics Nausea And Vomiting     REVIEW OF SYSTEMS: CONSTITUTIONAL: No fatigue, weakness, fever, chills, weight gain/loss, headache EYES: No blurry or double vision. ENT: No tinnitus, postnasal drip, redness or soreness of the oropharynx. RESPIRATORY: No dyspnea, cough, wheeze, hemoptysis. CARDIOVASCULAR: No chest pain, orthopnea, palpitations, syncope. GASTROINTESTINAL: No nausea, vomiting, constipation, diarrhea, Positive well-controlled abdominal pain. No hematemesis, melena or  hematochezia. GENITOURINARY: No dysuria, frequency, hematuria. ENDOCRINE: No polyuria or nocturia. No heat or cold intolerance. HEMATOLOGY: No anemia, bruising, bleeding. INTEGUMENTARY: No rashes, ulcers, lesions. MUSCULOSKELETAL: No pain, arthritis, swelling, gout. NEUROLOGIC: No numbness, tingling, weakness or ataxia. No seizure-type activity. PSYCHIATRIC: No anxiety, depression, insomnia.  PHYSICAL EXAMINATION: VITAL SIGNS: Blood pressure (!) 158/68, pulse 76, temperature 97.8 F (36.6 C), temperature source Oral, resp. rate 16, height 5\' 7"  (1.702 m), weight 81.6 kg (180 lb), SpO2 96 %.  GENERAL: 70 y.o.-year-old white male patient, well-developed, well-nourished lying in the bed in no acute distress.  Pleasant and cooperative.   HEENT: Head atraumatic, normocephalic. Pupils equal, round, reactive to light and accommodation. No scleral icterus. Extraocular muscles intact. Oropharynx is clear. Mucus membranes moist. NECK: Supple, full range of motion. No JVD, no bruit heard. No cervical lymphadenopathy. CHEST: Normal breath sounds bilaterally. No wheezing, rales, rhonchi or crackles. No use of accessory muscles of respiration.  No reproducible chest wall tenderness.  CARDIOVASCULAR: S1, S2 normal. No murmurs, rubs, or gallops appreciated. Cap refill <2 seconds. ABDOMEN: Soft, nontender, nondistended. No rebound, guarding, rigidity. Normoactive bowel sounds present in all four quadrants. No organomegaly or mass. EXTREMITIES: Full range of motion. No pedal  edema, cyanosis, or clubbing. NEUROLOGIC: Cranial nerves II through XII are grossly intact with no focal sensorimotor deficit. Muscle strength 5/5 in all extremities. Sensation intact. Gait not checked. PSYCHIATRIC: The patient is alert and oriented x 3. Normal affect, mood, thought content. SKIN: Warm, dry, and intact without obvious rash, lesion, or ulcer.  LABORATORY PANEL:  CBC  Recent Labs Lab 08/15/16 0754  WBC 11.3*  HGB  14.2  HCT 41.0  PLT 262   ----------------------------------------------------------------------------------------------------------------- Chemistries  Recent Labs Lab 08/15/16 0754  NA 137  K 4.2  CL 106  CO2 22  GLUCOSE 272*  BUN 23*  CREATININE 1.63*  CALCIUM 9.4  AST 21  ALT 20  ALKPHOS 79  BILITOT 1.0   ------------------------------------------------------------------------------------------------------------------ Cardiac Enzymes  Recent Labs Lab 08/15/16 1205  TROPONINI <0.03   ------------------------------------------------------------------------------------------------------------------  RADIOLOGY: US Abdomen Complete  Result Date: 08/15/2016 CLINICAL DATA:  Epigastric pain radiating toward back EXAM: ABDOMEN ULTRASOUND COMPLETE COMPARISON:  CT abdomen pelvis June 17, 2014 FINDINGS: Gallbladder: Within the gallbladder, there is an echogenic focus which moves slightly within the neck of the gallbladder cannot be made to pass beyond the neck of the gallbladder. This calculus measures 13 mm in length. There is gallbladder wall thickening with slight pericholecystic fluid. No sonographic Murphy sign noted by sonographer. Common bile duct: Diameter: 4 mm. There is no intrahepatic, common hepatic, or common bile duct dilatation. Liver: No focal lesion identified. Within normal limits in parenchymal echogenicity. IVC: No abnormality visualized. Pancreas: Visualized portion unremarkable. Portions of pancreas obscured by gas. Pancreatic duct borderline prominent without focal lesion evident. Spleen: Size and appearance within normal limits. Right Kidney: Length: 10.5 cm. Echogenicity within normal limits. No mass or hydronephrosis visualized. Left Kidney: Length: 11.4 cm. Echogenicity within normal limits. No hydronephrosis visualized. There is a cyst arising from the upper pole left kidney measuring 3.0 x 2.8 x 2.9 cm. Abdominal aorta: No aneurysm visualized. Other  findings: No demonstrable ascites. IMPRESSION: Cholelithiasis with gallbladder wall edema and mild pericholecystic fluid. These are findings indicative of acute cholecystitis. Pancreatic duct borderline prominent without focal pancreatic duct lesion. Portions of pancreas obscured by gas. Cyst arising from upper pole left kidney. Study otherwise unremarkable. Electronically Signed   By: Lowella Grip III M.D.   On: 08/15/2016 11:28   Dg Chest Portable 1 View  Result Date: 08/15/2016 CLINICAL DATA:  Chest pain with shortness of breath EXAM: PORTABLE CHEST 1 VIEW COMPARISON:  April 15, 2016 FINDINGS: There is no edema or consolidation. The heart size and pulmonary vascularity are normal. No adenopathy. There is atherosclerotic calcification in the aorta. There are metallic pellets on the right. IMPRESSION: No edema or consolidation.  Aortic atherosclerosis. Electronically Signed   By: Lowella Grip III M.D.   On: 08/15/2016 08:14    EKG: Normal sinus rhythm at 61 bpm with normal axis and nonspecific ST-T wave changes. Inferior Q waves  IMPRESSION AND PLAN:  This is a 70 y.o. male with a history of  DM, HTN, HLD, Ischemic cardiomyopathy status post STEMI with stents in July 2017 on Brilinta, depression and arthritis  now being admitted with acute cholecystitis for non-surgical management.    1. Acute cholecystitis-management per surgical team 2. History of coronary artery disease status post STEMI -Continue aspirin, Lipitor, Lopressor, Brilinta, nitroglycerin sublingual -The patient were to require surgical intervention would require cardiology consultation for preop. 3. History of hypertension-continue metoprolol, ramipril, Norvasc 4. History of diabetes, diet controlled-Accu-Cheks before meals and at bedtime with regular insulin  sliding scale coverage. Check hemoglobin A1c 5. History of insomnia-continue Restoril 6. History of chronic pain-we'll hold tramadol as patient is on pain  regimen. 7. History of CK D, at baseline-we'll monitor CMP in a.m.  Diet/Nutrition: Clear liquids per surgery Fluids: IV normal saline per surgery DVT Px: Heparin. SCDs and early ambulation Code Status: Full  Management plans discussed with the patient and/or family who express understanding and agree with plan of care.  We will gladly follow along during this patient's hospital course.     TOTAL TIME TAKING CARE OF THIS PATIENT: 50 minutes.   Neaveh Belanger D.O. on 08/15/2016 at 10:09 PM Between 7am to 6pm - Pager - [phone] After 6pm go to [email or web address] - [employer] [facility] [place] [facility] Office [phone] CC: Primary care physician; [name], MD     Note: This dictation was prepared with Dragon dictation along with smaller phrase technology. Any transcriptional errors that result from this process are unintentional.

## 2016-08-15 NOTE — ED Notes (Signed)
Patient complaining of continued pain in epigastric region and new pain radiating to back. MD made aware. New ekg obtained.

## 2016-08-15 NOTE — H&P (Signed)
Gilbert Obrien is an 70 y.o. male.    Chief Complaint: Right upper quadrant pain  HPI: This patient was first and only episode of right upper quadrant pain that started last night improved at this point compared to when it started but is still causing him some discomfort he's had some nausea but no vomiting no fevers or chills and no jaundice or acholic stools. A workup in the emergency room suggest acute cholecystitis.  Of note the patient has had recent stent placement and is on an antiplatelet therapy and they cannot be stopped due to risk of infarction. He had a myocardial infarction and stent placement in July of this year.  He has been told that he cannot have elbow surgery currently because of his recent antiplatelet therapy and MI. The emergency room physician worked up his upper abdominal chest pain to ensure that he does not have a dissection with a transesophageal echo. Dr. Emily Filbert is his primary care physician and his cardiologist is in [place].  Past Medical History:  Diagnosis Date  . Arthritis   . Coronary artery disease    a. STEMI 03/2016 DESx1 to LCx, DES x 1 Mid LAD, DES x1 prox RCA  . Depression   . Diabetes mellitus without complication ([place])    type 2  . History of acute inferior wall myocardial infarction 04/15/2016   inf-lat/post >> PCI with DES of LCx; staged PCI of LAD and RCA  . Hypercholesteremia   . Hypertension   . Ischemic cardiomyopathy 04/19/2016   A. Inf-lat/post STEMI 7/17 >> b. Echo 04/17/16: Septal and post lateral HK, poor image quality, EF 35-40% // b. Echo 11/17: mild LVH, EF 50-55, inf-lat and ant-lat HK, Gr 1 DD, borderline dilated aortic root (37 mm), MAC    Past Surgical History:  Procedure Laterality Date  . CARDIAC CATHETERIZATION N/A 04/15/2016   Procedure: Left Heart Cath and Coronary Angiography;  Surgeon: Sherren Mocha, MD;  Location: [place] [facility];  Service: Cardiovascular;  Laterality: N/A;  . CARDIAC CATHETERIZATION N/A  04/15/2016   Procedure: Coronary Stent Intervention;  Surgeon: Sherren Mocha, MD;  Location: [place] [facility];  Service: Cardiovascular;  Laterality: N/A;  . CARDIAC CATHETERIZATION N/A 04/17/2016   Procedure: Coronary Stent Intervention;  Surgeon: Burnell Blanks, MD;  Location: [place] [facility];  Service: Cardiovascular;  Laterality: N/A;  . CARDIAC CATHETERIZATION N/A 04/18/2016   Procedure: Coronary Stent Intervention;  Surgeon: Burnell Blanks, MD;  Location: [place] [facility];  Service: Cardiovascular;  Laterality: N/A;  . CARDIAC CATHETERIZATION N/A 04/18/2016   Procedure: Temporary Pacemaker;  Surgeon: Burnell Blanks, MD;  Location: [place] [facility];  Service: Cardiovascular;  Laterality: N/A;  . CORONARY STENT PLACEMENT  04/17/2016    Severe stenosis proximal LAD, now s/p successful PTCA/DES x 1 proximal and mid LAD  . COSMETIC SURGERY  1974   right side facial       . SHOULDER SURGERY      Family History  Problem Relation Age of Onset  . Heart attack Brother   . Heart disease Brother    Social History:  reports that he quit smoking about 15 years ago. He has never used smokeless tobacco. He reports that he does not drink alcohol or use drugs.  Allergies:  Allergies  Allergen Reactions  . Atorvastatin Other (See Comments)    Myalgia.  Onset 12/27/2005.  Marland Kitchen Celecoxib Anaphylaxis    Swelling in throat.  . Sulfa Antibiotics Nausea  And Vomiting     (Not in a hospital admission)   Review of Systems  Constitutional: Negative for chills and fever.  HENT: Negative.   Eyes: Negative.   Respiratory: Negative.   Cardiovascular: Negative for chest pain, palpitations and orthopnea.  Gastrointestinal: Positive for abdominal pain and nausea. Negative for blood in stool, constipation, diarrhea, heartburn and vomiting.  Genitourinary: Negative.   Musculoskeletal: Negative.   Skin: Negative.   Neurological: Negative.   Endo/Heme/Allergies: Negative.    Psychiatric/Behavioral: Negative.      Physical Exam:  BP (!) 157/79   Pulse 74   Temp 97.4 F (36.3 C) (Oral)   Resp 14   Ht '5\' 7"'$  (1.702 m)   Wt 180 lb (81.6 kg)   SpO2 96%   BMI 28.19 kg/m   Physical Exam  Constitutional: He is oriented to person, place, and time and well-developed, well-nourished, and in no distress. No distress.  HENT:  Head: Normocephalic and atraumatic.  Eyes: Pupils are equal, round, and reactive to light. Right eye exhibits no discharge. Left eye exhibits no discharge. No scleral icterus.  Neck: Normal range of motion.  Cardiovascular: Normal rate, regular rhythm and normal heart sounds.   Pulmonary/Chest: Effort normal and breath sounds normal. No respiratory distress. He has no wheezes. He has no rales.  Abdominal: Soft. He exhibits no distension. There is tenderness. There is no rebound and no guarding.  Tenderness in the right upper quadrant with a question Murphy sign  Musculoskeletal: Normal range of motion. He exhibits no edema or tenderness.  Lymphadenopathy:    He has no cervical adenopathy.  Neurological: He is alert and oriented to person, place, and time.  Skin: Skin is warm and dry. No rash noted. He is not diaphoretic. No erythema.  Psychiatric: Mood and affect normal.  Vitals reviewed.       Results for orders placed or performed during the hospital encounter of 08/15/16 (from the past 48 hour(s))  CBC with Differential/Platelet     Status: Abnormal   Collection Time: 08/15/16  7:54 AM  Result Value Ref Range   WBC 11.3 (H) 3.8 - 10.6 K/uL   RBC 4.84 4.40 - 5.90 MIL/uL   Hemoglobin 14.2 13.0 - 18.0 g/dL   HCT 41.0 40.0 - 52.0 %   MCV 84.6 80.0 - 100.0 fL   MCH 29.3 26.0 - 34.0 pg   MCHC 34.7 32.0 - 36.0 g/dL   RDW 14.2 11.5 - 14.5 %   Platelets 262 150 - 440 K/uL   Neutrophils Relative % 77 %   Neutro Abs 8.8 (H) 1.4 - 6.5 K/uL   Lymphocytes Relative 14 %   Lymphs Abs 1.6 1.0 - 3.6 K/uL   Monocytes Relative 5 %    Monocytes Absolute 0.5 0.2 - 1.0 K/uL   Eosinophils Relative 3 %   Eosinophils Absolute 0.3 0 - 0.7 K/uL   Basophils Relative 1 %   Basophils Absolute 0.1 0 - 0.1 K/uL  Basic metabolic panel     Status: Abnormal   Collection Time: 08/15/16  7:54 AM  Result Value Ref Range   Sodium 137 135 - 145 mmol/L   Potassium 4.2 3.5 - 5.1 mmol/L   Chloride 106 101 - 111 mmol/L   CO2 22 22 - 32 mmol/L   Glucose, Bld 272 (H) 65 - 99 mg/dL   BUN 23 (H) 6 - 20 mg/dL   Creatinine, Ser 1.63 (H) 0.61 - 1.24 mg/dL   Calcium 9.4 8.9 -  10.3 mg/dL   GFR calc non Af Amer 41 (L) >60 mL/min   GFR calc Af Amer 47 (L) >60 mL/min    Comment: (NOTE) The eGFR has been calculated using the CKD EPI equation. This calculation has not been validated in all clinical situations. eGFR's persistently <60 mL/min signify possible Chronic Kidney Disease.    Anion gap 9 5 - 15  Troponin I     Status: None   Collection Time: 08/15/16  7:54 AM  Result Value Ref Range   Troponin I <0.03 <0.03 ng/mL  Brain natriuretic peptide     Status: None   Collection Time: 08/15/16  7:54 AM  Result Value Ref Range   B Natriuretic Peptide 21.0 0.0 - 100.0 pg/mL  Lipase, blood     Status: None   Collection Time: 08/15/16  7:54 AM  Result Value Ref Range   Lipase 46 11 - 51 U/L  Hepatic function panel     Status: None   Collection Time: 08/15/16  7:54 AM  Result Value Ref Range   Total Protein 7.6 6.5 - 8.1 g/dL   Albumin 4.2 3.5 - 5.0 g/dL   AST 21 15 - 41 U/L   ALT 20 17 - 63 U/L   Alkaline Phosphatase 79 38 - 126 U/L   Total Bilirubin 1.0 0.3 - 1.2 mg/dL   Bilirubin, Direct 0.2 0.1 - 0.5 mg/dL   Indirect Bilirubin 0.8 0.3 - 0.9 mg/dL  Troponin I     Status: None   Collection Time: 08/15/16 12:05 PM  Result Value Ref Range   Troponin I <0.03 <0.03 ng/mL   US Abdomen Complete  Result Date: 08/15/2016 CLINICAL DATA:  Epigastric pain radiating toward back EXAM: ABDOMEN ULTRASOUND COMPLETE COMPARISON:  CT abdomen pelvis  June 17, 2014 FINDINGS: Gallbladder: Within the gallbladder, there is an echogenic focus which moves slightly within the neck of the gallbladder cannot be made to pass beyond the neck of the gallbladder. This calculus measures 13 mm in length. There is gallbladder wall thickening with slight pericholecystic fluid. No sonographic Murphy sign noted by sonographer. Common bile duct: Diameter: 4 mm. There is no intrahepatic, common hepatic, or common bile duct dilatation. Liver: No focal lesion identified. Within normal limits in parenchymal echogenicity. IVC: No abnormality visualized. Pancreas: Visualized portion unremarkable. Portions of pancreas obscured by gas. Pancreatic duct borderline prominent without focal lesion evident. Spleen: Size and appearance within normal limits. Right Kidney: Length: 10.5 cm. Echogenicity within normal limits. No mass or hydronephrosis visualized. Left Kidney: Length: 11.4 cm. Echogenicity within normal limits. No hydronephrosis visualized. There is a cyst arising from the upper pole left kidney measuring 3.0 x 2.8 x 2.9 cm. Abdominal aorta: No aneurysm visualized. Other findings: No demonstrable ascites. IMPRESSION: Cholelithiasis with gallbladder wall edema and mild pericholecystic fluid. These are findings indicative of acute cholecystitis. Pancreatic duct borderline prominent without focal pancreatic duct lesion. Portions of pancreas obscured by gas. Cyst arising from upper pole left kidney. Study otherwise unremarkable. Electronically Signed   By: Lowella Grip III M.D.   On: 08/15/2016 11:28   Dg Chest Portable 1 View  Result Date: 08/15/2016 CLINICAL DATA:  Chest pain with shortness of breath EXAM: PORTABLE CHEST 1 VIEW COMPARISON:  April 15, 2016 FINDINGS: There is no edema or consolidation. The heart size and pulmonary vascularity are normal. No adenopathy. There is atherosclerotic calcification in the aorta. There are metallic pellets on the right. IMPRESSION:  No edema or consolidation.  Aortic atherosclerosis. Electronically  Signed   By: Lowella Grip III M.D.   On: 08/15/2016 08:14     Assessment/Plan  This a patient with probable acute cholecystitis under most circumstances his symptoms and workup would warrant a laparoscopic cholecystectomy. However he in the last 4 months he has had an MI and stents placed and he is on antiplatelet therapy and it cannot be stopped. He has been told that he could not have elbow surgery because of the new medication. Without a mind I would admit him to the hospital with IV hydration pain control anti-emetics and antibiotics and reexamination should he worsen acutely then cholecystostomy tube would be favored over laparoscopic cholecystectomy this is all discussed with him and no family was present the emergency room physician and I discussed this plan as well.  Florene Glen, MD, FACS

## 2016-08-15 NOTE — ED Notes (Signed)
Patient transported to US via stretcher.

## 2016-08-15 NOTE — ED Notes (Signed)
Spoke to [place] in pharmacy who said to give morning dose of Brilinta now. She states she will tube up daily meds.

## 2016-08-15 NOTE — ED Notes (Signed)
Patient returned from ultrasound.

## 2016-08-15 NOTE — ED Notes (Signed)
Helped pt bathroom. Hooked pt back up to monitor.

## 2016-08-15 NOTE — ED Triage Notes (Signed)
Patient reports chest pain starting in the night with some shortness of breath and lightheadedness. States that he has stabbing pain in epigastric region and left chest. Patient has history of MI in July of this year. Reports similar symptoms then.

## 2016-08-15 NOTE — Progress Notes (Signed)
*  PRELIMINARY RESULTS* Echocardiogram 2D Echocardiogram has been performed.  Sherrie Sport 08/15/2016, 9:42 AM

## 2016-08-16 DIAGNOSIS — R1011 Right upper quadrant pain: Secondary | ICD-10-CM

## 2016-08-16 DIAGNOSIS — R109 Unspecified abdominal pain: Secondary | ICD-10-CM

## 2016-08-16 LAB — CBC
HEMATOCRIT: 36.4 % — AB (ref 40.0–52.0)
Hemoglobin: 12.5 g/dL — ABNORMAL LOW (ref 13.0–18.0)
MCH: 28.6 pg (ref 26.0–34.0)
MCHC: 34.4 g/dL (ref 32.0–36.0)
MCV: 83.2 fL (ref 80.0–100.0)
Platelets: 218 10*3/uL (ref 150–440)
RBC: 4.38 MIL/uL — ABNORMAL LOW (ref 4.40–5.90)
RDW: 14.8 % — AB (ref 11.5–14.5)
WBC: 11.1 10*3/uL — ABNORMAL HIGH (ref 3.8–10.6)

## 2016-08-16 LAB — COMPREHENSIVE METABOLIC PANEL
ALBUMIN: 3.5 g/dL (ref 3.5–5.0)
ALT: 21 U/L (ref 17–63)
AST: 16 U/L (ref 15–41)
Alkaline Phosphatase: 68 U/L (ref 38–126)
Anion gap: 8 (ref 5–15)
BILIRUBIN TOTAL: 1.9 mg/dL — AB (ref 0.3–1.2)
BUN: 15 mg/dL (ref 6–20)
CHLORIDE: 110 mmol/L (ref 101–111)
CO2: 22 mmol/L (ref 22–32)
Calcium: 8.6 mg/dL — ABNORMAL LOW (ref 8.9–10.3)
Creatinine, Ser: 1.44 mg/dL — ABNORMAL HIGH (ref 0.61–1.24)
GFR calc Af Amer: 55 mL/min — ABNORMAL LOW (ref >60)
GFR calc non Af Amer: 47 mL/min — ABNORMAL LOW (ref >60)
GLUCOSE: 142 mg/dL — AB (ref 65–99)
POTASSIUM: 3.4 mmol/L — AB (ref 3.5–5.1)
Sodium: 140 mmol/L (ref 135–145)
Total Protein: 6.6 g/dL (ref 6.5–8.1)

## 2016-08-16 LAB — GLUCOSE, CAPILLARY: Glucose-Capillary: 133 mg/dL — ABNORMAL HIGH (ref 65–99)

## 2016-08-16 MED ORDER — AMOXICILLIN-POT CLAVULANATE 875-125 MG PO TABS: 1.0000 | ORAL_TABLET | Freq: Two times a day (BID) | ORAL | 1 refills | Status: DC

## 2016-08-16 MED ORDER — HYDROCODONE-ACETAMINOPHEN 5-325 MG PO TABS: 1.0000 | ORAL_TABLET | ORAL | 0 refills | Status: DC | PRN

## 2016-08-16 NOTE — Progress Notes (Signed)
[facility] [place] NAME: Gilbert Obrien    MR#:  [id number]  DATE OF BIRTH:  [birth year]  SUBJECTIVE:  CHIEF COMPLAINT:   Chief Complaint  Patient presents with  . Chest Pain   Feels better. Tolerating liquids. No abdominal pain. Afebrile.  REVIEW OF SYSTEMS:    Review of Systems  Constitutional: Negative for chills and fever.  HENT: Negative for sore throat.   Eyes: Negative for blurred vision, double vision and pain.  Respiratory: Negative for cough, hemoptysis, shortness of breath and wheezing.   Cardiovascular: Negative for chest pain, palpitations, orthopnea and leg swelling.  Gastrointestinal: Negative for abdominal pain, constipation, diarrhea, heartburn, nausea and vomiting.  Genitourinary: Negative for dysuria and hematuria.  Musculoskeletal: Negative for back pain and joint pain.  Skin: Negative for rash.  Neurological: Negative for sensory change, speech change, focal weakness and headaches.  Endo/Heme/Allergies: Does not bruise/bleed easily.  Psychiatric/Behavioral: Negative for depression. The patient is not nervous/anxious.     DRUG ALLERGIES:   Allergies  Allergen Reactions  . Atorvastatin Other (See Comments)    Myalgia.  Onset 12/27/2005.  Marland Kitchen Celecoxib Anaphylaxis    Swelling in throat.  . Sulfa Antibiotics Nausea And Vomiting    VITALS:  Blood pressure 130/77, pulse 87, temperature 98.4 F (36.9 C), temperature source Oral, resp. rate 19, height 5\' 7"  (1.702 m), weight 81.6 kg (180 lb), SpO2 96 %.  PHYSICAL EXAMINATION:   Physical Exam  GENERAL:  70 y.o.-year-old patient lying in the bed with no acute distress.  EYES: Pupils equal, round, reactive to light and accommodation. No scleral icterus. Extraocular muscles intact.  HEENT: Head atraumatic, normocephalic. Oropharynx and nasopharynx clear.  NECK:  Supple, no jugular venous distention. No thyroid enlargement, no tenderness.  LUNGS: Normal breath  sounds bilaterally, no wheezing, rales, rhonchi. No use of accessory muscles of respiration.  CARDIOVASCULAR: S1, S2 normal. No murmurs, rubs, or gallops.  ABDOMEN: Soft, nontender, nondistended. Bowel sounds present. No organomegaly or mass.  EXTREMITIES: No cyanosis, clubbing or edema b/l.    NEUROLOGIC: Cranial nerves II through XII are intact. No focal Motor or sensory deficits b/l.   PSYCHIATRIC: The patient is alert and oriented x 3.  SKIN: No obvious rash, lesion, or ulcer.   LABORATORY PANEL:   CBC  Recent Labs Lab 08/16/16 0423  WBC 11.1*  HGB 12.5*  HCT 36.4*  PLT 218   ------------------------------------------------------------------------------------------------------------------ Chemistries   Recent Labs Lab 08/16/16 0423  NA 140  K 3.4*  CL 110  CO2 22  GLUCOSE 142*  BUN 15  CREATININE 1.44*  CALCIUM 8.6*  AST 16  ALT 21  ALKPHOS 68  BILITOT 1.9*   ------------------------------------------------------------------------------------------------------------------  Cardiac Enzymes  Recent Labs Lab 08/15/16 1205  TROPONINI <0.03   ------------------------------------------------------------------------------------------------------------------  RADIOLOGY:  US Abdomen Complete  Result Date: 08/15/2016 CLINICAL DATA:  Epigastric pain radiating toward back EXAM: ABDOMEN ULTRASOUND COMPLETE COMPARISON:  CT abdomen pelvis June 17, 2014 FINDINGS: Gallbladder: Within the gallbladder, there is an echogenic focus which moves slightly within the neck of the gallbladder cannot be made to pass beyond the neck of the gallbladder. This calculus measures 13 mm in length. There is gallbladder wall thickening with slight pericholecystic fluid. No sonographic Murphy sign noted by sonographer. Common bile duct: Diameter: 4 mm. There is no intrahepatic, common hepatic, or common bile duct dilatation. Liver: No focal lesion identified. Within normal limits in  parenchymal echogenicity. IVC: No abnormality visualized. Pancreas: Visualized portion  unremarkable. Portions of pancreas obscured by gas. Pancreatic duct borderline prominent without focal lesion evident. Spleen: Size and appearance within normal limits. Right Kidney: Length: 10.5 cm. Echogenicity within normal limits. No mass or hydronephrosis visualized. Left Kidney: Length: 11.4 cm. Echogenicity within normal limits. No hydronephrosis visualized. There is a cyst arising from the upper pole left kidney measuring 3.0 x 2.8 x 2.9 cm. Abdominal aorta: No aneurysm visualized. Other findings: No demonstrable ascites. IMPRESSION: Cholelithiasis with gallbladder wall edema and mild pericholecystic fluid. These are findings indicative of acute cholecystitis. Pancreatic duct borderline prominent without focal pancreatic duct lesion. Portions of pancreas obscured by gas. Cyst arising from upper pole left kidney. Study otherwise unremarkable. Electronically Signed   By: Lowella Grip III M.D.   On: 08/15/2016 11:28   Dg Chest Portable 1 View  Result Date: 08/15/2016 CLINICAL DATA:  Chest pain with shortness of breath EXAM: PORTABLE CHEST 1 VIEW COMPARISON:  April 15, 2016 FINDINGS: There is no edema or consolidation. The heart size and pulmonary vascularity are normal. No adenopathy. There is atherosclerotic calcification in the aorta. There are metallic pellets on the right. IMPRESSION: No edema or consolidation.  Aortic atherosclerosis. Electronically Signed   By: Lowella Grip III M.D.   On: 08/15/2016 08:14     ASSESSMENT AND PLAN:   This is a 70 y.o. male with a history of  DM, HTN, HLD, Ischemic cardiomyopathy status post STEMI with stents in July 2017 on Brilinta, depression and arthritis  admitted with acute cholecystitis for non-surgical management.    1. Acute cholecystitis Improving and tolerating diet. Seen by Dr. [name]. Discussed case with him. Patient is being discharged home. 2.  History of coronary artery disease status post STEMI -Continue aspirin, Lipitor, Lopressor, Brilinta, nitroglycerin sublingual Outpatient follow-up with cardiology as routinely planned.  3. History of hypertension-continue metoprolol, ramipril, Norvasc 4. History of diabetes, diet controlled No change in home medications  All the records are reviewed and case discussed with Care Management/Social Workerr. Management plans discussed with the patient, family and they are in agreement.  CODE STATUS: FULL  DVT Prophylaxis: SCDs  TOTAL TIME TAKING CARE OF THIS PATIENT: 25 minutes.   Hillary Bow R M.D on 08/16/2016 at 9:09 AM  Between 7am to 6pm - Pager - [phone]  After 6pm go to [email or web address] - password [email or web address] [facility]  Office  [phone]  CC: Primary care physician; [name], MD  Note: This dictation was prepared with Dragon dictation along with smaller phrase technology. Any transcriptional errors that result from this process are unintentional.

## 2016-08-16 NOTE — Discharge Summary (Signed)
Physician Discharge Summary  Patient ID: Gilbert Obrien MRN: [id number] DOB/AGE: [birth year] 70 y.o.  Admit date: 08/15/2016 Discharge date: 08/16/2016   Discharge Diagnoses:  Active Problems:   Cholecystitis   Abdominal pain   Procedures:None  Hospital Course: This patient with a workup suggestive of acute cholecystitis who is in the emergency room and admitted for medical management due to his inability to have surgery and the acute process due to recent myocardial infarction and stent placement with antiplatelet drugs in the last 4 months. This morning his pain is completely resolved he wants to advance his diet be discharged she will be discharged on oral antibiotics as well as analgesics with instructions to follow-up with me in 1 week or to the emergency room should he worsen again.  Consults: Internal medicine  Disposition: 01-Home or Self Care     Medication List    TAKE these medications   amLODipine 10 MG tablet Commonly known as:  NORVASC Take 1 tablet (10 mg total) by mouth daily.   amoxicillin-clavulanate 875-125 MG tablet Commonly known as:  AUGMENTIN Take 1 tablet by mouth 2 (two) times daily.   aspirin 81 MG EC tablet Take 1 tablet (81 mg total) by mouth daily.   atorvastatin 80 MG tablet Commonly known as:  LIPITOR Take 1 tablet (80 mg total) by mouth daily at 6 PM.   HYDROcodone-acetaminophen 5-325 MG tablet Commonly known as:  NORCO/VICODIN Take 1-2 tablets by mouth every 4 (four) hours as needed for moderate pain.   metoprolol tartrate 25 MG tablet Commonly known as:  LOPRESSOR Take 1 tablet (25 mg total) by mouth 2 (two) times daily.   nitroGLYCERIN 0.4 MG SL tablet Commonly known as:  NITROSTAT Place 1 tablet (0.4 mg total) under the tongue every 5 (five) minutes x 3 doses as needed for chest pain.   ramipril 5 MG capsule Commonly known as:  ALTACE Take 1 capsule (5 mg total) by mouth daily.   temazepam 15 MG capsule Commonly known  as:  RESTORIL Take 15 mg by mouth at bedtime as needed for sleep.   ticagrelor 90 MG Tabs tablet Commonly known as:  BRILINTA Take 1 tablet (90 mg total) by mouth 2 (two) times daily.   traMADol 50 MG tablet Commonly known as:  ULTRAM Take 50 mg by mouth every 6 (six) hours as needed for moderate pain.        Florene Glen, MD, FACS

## 2016-08-16 NOTE — Care Management Important Message (Signed)
Important Message  Patient Details  Name: SWANSON KOSTOFF MRN: [id number] Date of Birth: November 22, 1945   Medicare Important Message Given:  N/A - LOS <3 / Initial given by admissions    Beverly Sessions, RN 08/16/2016, 10:44 AM

## 2016-08-16 NOTE — Progress Notes (Signed)
CC: Right upper quadrant pain Subjective: This patient with right upper quadrant pain her workup suggesting acute cholecystitis. This morning his pain is completely gone he has no nausea vomiting fevers or chills and feels better.  Objective: Vital signs in last 24 hours: Temp:  [97.8 F (36.6 C)-98.4 F (36.9 C)] 98.4 F (36.9 C) (11/22 0532) Pulse Rate:  [65-103] 87 (11/22 0532) Resp:  [14-21] 19 (11/22 0532) BP: (130-167)/(68-85) 130/77 (11/22 0532) SpO2:  [96 %-100 %] 96 % (11/22 0532) Last BM Date: 08/13/16  Intake/Output from previous day: 11/21 0701 - 11/22 0700 In: 950 [P.O.:750; IV Piggyback:200] Out: 0  Intake/Output this shift: No intake/output data recorded.  Physical exam:  Vital signs reviewed. No icterus no jaundice Abdomen is soft nontender negative Murphy sign extremities without edema calves are nontender.  Lab Results: CBC   Recent Labs  08/15/16 0754 08/16/16 0423  WBC 11.3* 11.1*  HGB 14.2 12.5*  HCT 41.0 36.4*  PLT 262 218   BMET  Recent Labs  08/15/16 0754 08/16/16 0423  NA 137 140  K 4.2 3.4*  CL 106 110  CO2 22 22  GLUCOSE 272* 142*  BUN 23* 15  CREATININE 1.63* 1.44*  CALCIUM 9.4 8.6*   PT/INR No results for input(s): LABPROT, INR in the last 72 hours. ABG No results for input(s): PHART, HCO3 in the last 72 hours.  Invalid input(s): PCO2, PO2  Studies/Results: US Abdomen Complete  Result Date: 08/15/2016 CLINICAL DATA:  Epigastric pain radiating toward back EXAM: ABDOMEN ULTRASOUND COMPLETE COMPARISON:  CT abdomen pelvis June 17, 2014 FINDINGS: Gallbladder: Within the gallbladder, there is an echogenic focus which moves slightly within the neck of the gallbladder cannot be made to pass beyond the neck of the gallbladder. This calculus measures 13 mm in length. There is gallbladder wall thickening with slight pericholecystic fluid. No sonographic Murphy sign noted by sonographer. Common bile duct: Diameter: 4 mm. There  is no intrahepatic, common hepatic, or common bile duct dilatation. Liver: No focal lesion identified. Within normal limits in parenchymal echogenicity. IVC: No abnormality visualized. Pancreas: Visualized portion unremarkable. Portions of pancreas obscured by gas. Pancreatic duct borderline prominent without focal lesion evident. Spleen: Size and appearance within normal limits. Right Kidney: Length: 10.5 cm. Echogenicity within normal limits. No mass or hydronephrosis visualized. Left Kidney: Length: 11.4 cm. Echogenicity within normal limits. No hydronephrosis visualized. There is a cyst arising from the upper pole left kidney measuring 3.0 x 2.8 x 2.9 cm. Abdominal aorta: No aneurysm visualized. Other findings: No demonstrable ascites. IMPRESSION: Cholelithiasis with gallbladder wall edema and mild pericholecystic fluid. These are findings indicative of acute cholecystitis. Pancreatic duct borderline prominent without focal pancreatic duct lesion. Portions of pancreas obscured by gas. Cyst arising from upper pole left kidney. Study otherwise unremarkable. Electronically Signed   By: Lowella Grip III M.D.   On: 08/15/2016 11:28   Dg Chest Portable 1 View  Result Date: 08/15/2016 CLINICAL DATA:  Chest pain with shortness of breath EXAM: PORTABLE CHEST 1 VIEW COMPARISON:  April 15, 2016 FINDINGS: There is no edema or consolidation. The heart size and pulmonary vascularity are normal. No adenopathy. There is atherosclerotic calcification in the aorta. There are metallic pellets on the right. IMPRESSION: No edema or consolidation.  Aortic atherosclerosis. Electronically Signed   By: Lowella Grip III M.D.   On: 08/15/2016 08:14    Anti-infectives: Anti-infectives    Start     Dose/Rate Route Frequency Ordered Stop   08/16/16 0000  amoxicillin-clavulanate (AUGMENTIN) 875-125 MG tablet     1 tablet Oral 2 times daily 08/16/16 0947     08/15/16 1400  Ampicillin-Sulbactam (UNASYN) 3 g in sodium  chloride 0.9 % 100 mL IVPB     3 g 200 mL/hr over 30 Minutes Intravenous Every 6 hours 08/15/16 1314     08/15/16 1145  piperacillin-tazobactam (ZOSYN) IVPB 3.375 g     3.375 g 100 mL/hr over 30 Minutes Intravenous  Once 08/15/16 1132 08/15/16 1209      Assessment/Plan:  Patient's symptoms have completely resolved. With his recent MI and stent placement on antiplatelet drugs he cannot have surgery at this point therefore I will discharge him on oral antibiotics resuming all of his other home medications including antiplatelet drugs and have him follow-up in my office next week. Instructions were given to advance his diet slowly and avoid fats and a follow-up in my office or in the emergency room should his pain and nausea returned.  Florene Glen, MD, FACS  08/16/2016

## 2016-08-16 NOTE — Progress Notes (Signed)
08/16/2016 11:54 AM  BP 130/77 (BP Location: Right Arm)   Pulse 87   Temp 98.4 F (36.9 C) (Oral)   Resp 19   Ht 5\' 7"  (1.702 m)   Wt 81.6 kg (180 lb)   SpO2 96%   BMI 28.19 kg/m  Patient discharged per MD orders. Discharge instructions reviewed with patient and patient verbalized understanding. IV's removed per policy. Prescriptions discussed and given to patient. Discharged via wheelchair escorted by nursing staff.  Almedia Balls, RN

## 2016-08-30 ENCOUNTER — Ambulatory Visit: Payer: Self-pay | Admitting: General Surgery

## 2016-09-15 DIAGNOSIS — I2510 Atherosclerotic heart disease of native coronary artery without angina pectoris: Secondary | ICD-10-CM | POA: Diagnosis not present

## 2016-09-15 DIAGNOSIS — I255 Ischemic cardiomyopathy: Secondary | ICD-10-CM | POA: Diagnosis not present

## 2016-09-15 DIAGNOSIS — I10 Essential (primary) hypertension: Secondary | ICD-10-CM | POA: Diagnosis not present

## 2016-09-15 DIAGNOSIS — R600 Localized edema: Secondary | ICD-10-CM | POA: Diagnosis not present

## 2016-09-21 DIAGNOSIS — Z125 Encounter for screening for malignant neoplasm of prostate: Secondary | ICD-10-CM | POA: Diagnosis not present

## 2016-09-21 DIAGNOSIS — E119 Type 2 diabetes mellitus without complications: Secondary | ICD-10-CM | POA: Diagnosis not present

## 2016-09-27 DIAGNOSIS — I255 Ischemic cardiomyopathy: Secondary | ICD-10-CM | POA: Diagnosis not present

## 2016-09-27 DIAGNOSIS — Z0000 Encounter for general adult medical examination without abnormal findings: Secondary | ICD-10-CM | POA: Diagnosis not present

## 2016-09-27 DIAGNOSIS — E1122 Type 2 diabetes mellitus with diabetic chronic kidney disease: Secondary | ICD-10-CM | POA: Diagnosis not present

## 2016-09-27 DIAGNOSIS — N183 Chronic kidney disease, stage 3 (moderate): Secondary | ICD-10-CM | POA: Diagnosis not present

## 2016-10-03 ENCOUNTER — Observation Stay (HOSPITAL_COMMUNITY)
Admission: EM | Admit: 2016-10-03 | Discharge: 2016-10-05 | Disposition: A | Discharge status: Home / Self Care | Payer: Medicare HMO | Attending: Internal Medicine | Admitting: Internal Medicine

## 2016-10-03 ENCOUNTER — Encounter (HOSPITAL_COMMUNITY): Payer: Self-pay

## 2016-10-03 ENCOUNTER — Emergency Department (HOSPITAL_COMMUNITY): Payer: Medicare HMO

## 2016-10-03 DIAGNOSIS — Z87891 Personal history of nicotine dependence: Secondary | ICD-10-CM | POA: Diagnosis not present

## 2016-10-03 DIAGNOSIS — I639 Cerebral infarction, unspecified: Secondary | ICD-10-CM | POA: Diagnosis not present

## 2016-10-03 DIAGNOSIS — I10 Essential (primary) hypertension: Secondary | ICD-10-CM | POA: Diagnosis not present

## 2016-10-03 DIAGNOSIS — I2510 Atherosclerotic heart disease of native coronary artery without angina pectoris: Secondary | ICD-10-CM | POA: Diagnosis not present

## 2016-10-03 DIAGNOSIS — G9389 Other specified disorders of brain: Secondary | ICD-10-CM | POA: Insufficient documentation

## 2016-10-03 DIAGNOSIS — I152 Hypertension secondary to endocrine disorders: Secondary | ICD-10-CM | POA: Diagnosis present

## 2016-10-03 DIAGNOSIS — F329 Major depressive disorder, single episode, unspecified: Secondary | ICD-10-CM | POA: Insufficient documentation

## 2016-10-03 DIAGNOSIS — E1122 Type 2 diabetes mellitus with diabetic chronic kidney disease: Secondary | ICD-10-CM | POA: Insufficient documentation

## 2016-10-03 DIAGNOSIS — N183 Chronic kidney disease, stage 3 unspecified: Secondary | ICD-10-CM | POA: Diagnosis present

## 2016-10-03 DIAGNOSIS — Z955 Presence of coronary angioplasty implant and graft: Secondary | ICD-10-CM | POA: Insufficient documentation

## 2016-10-03 DIAGNOSIS — E785 Hyperlipidemia, unspecified: Secondary | ICD-10-CM | POA: Diagnosis present

## 2016-10-03 DIAGNOSIS — Z79899 Other long term (current) drug therapy: Secondary | ICD-10-CM | POA: Diagnosis not present

## 2016-10-03 DIAGNOSIS — Z7982 Long term (current) use of aspirin: Secondary | ICD-10-CM | POA: Diagnosis not present

## 2016-10-03 DIAGNOSIS — R29898 Other symptoms and signs involving the musculoskeletal system: Secondary | ICD-10-CM

## 2016-10-03 DIAGNOSIS — M1990 Unspecified osteoarthritis, unspecified site: Secondary | ICD-10-CM | POA: Insufficient documentation

## 2016-10-03 DIAGNOSIS — E7800 Pure hypercholesterolemia, unspecified: Secondary | ICD-10-CM | POA: Diagnosis not present

## 2016-10-03 DIAGNOSIS — E1169 Type 2 diabetes mellitus with other specified complication: Secondary | ICD-10-CM | POA: Diagnosis present

## 2016-10-03 DIAGNOSIS — Z8249 Family history of ischemic heart disease and other diseases of the circulatory system: Secondary | ICD-10-CM | POA: Diagnosis not present

## 2016-10-03 DIAGNOSIS — I255 Ischemic cardiomyopathy: Secondary | ICD-10-CM | POA: Diagnosis not present

## 2016-10-03 DIAGNOSIS — R29818 Other symptoms and signs involving the nervous system: Secondary | ICD-10-CM | POA: Diagnosis not present

## 2016-10-03 DIAGNOSIS — G8321 Monoplegia of upper limb affecting right dominant side: Secondary | ICD-10-CM | POA: Insufficient documentation

## 2016-10-03 DIAGNOSIS — Z888 Allergy status to other drugs, medicaments and biological substances status: Secondary | ICD-10-CM | POA: Diagnosis not present

## 2016-10-03 DIAGNOSIS — E1129 Type 2 diabetes mellitus with other diabetic kidney complication: Secondary | ICD-10-CM

## 2016-10-03 DIAGNOSIS — I129 Hypertensive chronic kidney disease with stage 1 through stage 4 chronic kidney disease, or unspecified chronic kidney disease: Secondary | ICD-10-CM | POA: Insufficient documentation

## 2016-10-03 DIAGNOSIS — I252 Old myocardial infarction: Secondary | ICD-10-CM | POA: Insufficient documentation

## 2016-10-03 DIAGNOSIS — I6523 Occlusion and stenosis of bilateral carotid arteries: Secondary | ICD-10-CM | POA: Insufficient documentation

## 2016-10-03 DIAGNOSIS — M6281 Muscle weakness (generalized): Secondary | ICD-10-CM | POA: Diagnosis not present

## 2016-10-03 DIAGNOSIS — Z8673 Personal history of transient ischemic attack (TIA), and cerebral infarction without residual deficits: Secondary | ICD-10-CM | POA: Diagnosis present

## 2016-10-03 DIAGNOSIS — R4781 Slurred speech: Secondary | ICD-10-CM | POA: Diagnosis not present

## 2016-10-03 DIAGNOSIS — E119 Type 2 diabetes mellitus without complications: Secondary | ICD-10-CM

## 2016-10-03 DIAGNOSIS — E876 Hypokalemia: Secondary | ICD-10-CM | POA: Diagnosis not present

## 2016-10-03 DIAGNOSIS — R531 Weakness: Secondary | ICD-10-CM

## 2016-10-03 DIAGNOSIS — Z882 Allergy status to sulfonamides status: Secondary | ICD-10-CM | POA: Diagnosis not present

## 2016-10-03 DIAGNOSIS — R202 Paresthesia of skin: Secondary | ICD-10-CM | POA: Diagnosis not present

## 2016-10-03 DIAGNOSIS — R208 Other disturbances of skin sensation: Secondary | ICD-10-CM | POA: Diagnosis present

## 2016-10-03 LAB — PROTIME-INR
INR: 0.99
PROTHROMBIN TIME: 13.1 s (ref 11.4–15.2)

## 2016-10-03 LAB — RAPID URINE DRUG SCREEN, HOSP PERFORMED
AMPHETAMINES: NOT DETECTED
BARBITURATES: NOT DETECTED
Benzodiazepines: POSITIVE — AB
Cocaine: NOT DETECTED
Opiates: NOT DETECTED
TETRAHYDROCANNABINOL: NOT DETECTED

## 2016-10-03 LAB — COMPREHENSIVE METABOLIC PANEL
ALK PHOS: 90 U/L (ref 38–126)
ALT: 21 U/L (ref 17–63)
ANION GAP: 9 (ref 5–15)
AST: 16 U/L (ref 15–41)
Albumin: 3.8 g/dL (ref 3.5–5.0)
BILIRUBIN TOTAL: 1.5 mg/dL — AB (ref 0.3–1.2)
BUN: 12 mg/dL (ref 6–20)
CALCIUM: 9 mg/dL (ref 8.9–10.3)
CO2: 24 mmol/L (ref 22–32)
Chloride: 106 mmol/L (ref 101–111)
Creatinine, Ser: 1.59 mg/dL — ABNORMAL HIGH (ref 0.61–1.24)
GFR calc non Af Amer: 42 mL/min — ABNORMAL LOW (ref >60)
GFR, EST AFRICAN AMERICAN: 49 mL/min — AB (ref >60)
Glucose, Bld: 196 mg/dL — ABNORMAL HIGH (ref 65–99)
Potassium: 3.4 mmol/L — ABNORMAL LOW (ref 3.5–5.1)
Sodium: 139 mmol/L (ref 135–145)
TOTAL PROTEIN: 6.9 g/dL (ref 6.5–8.1)

## 2016-10-03 LAB — GLUCOSE, CAPILLARY: Glucose-Capillary: 113 mg/dL — ABNORMAL HIGH (ref 65–99)

## 2016-10-03 LAB — I-STAT CHEM 8, ED
BUN: 14 mg/dL (ref 6–20)
CALCIUM ION: 1.12 mmol/L — AB (ref 1.15–1.40)
CHLORIDE: 103 mmol/L (ref 101–111)
Creatinine, Ser: 1.6 mg/dL — ABNORMAL HIGH (ref 0.61–1.24)
Glucose, Bld: 194 mg/dL — ABNORMAL HIGH (ref 65–99)
HCT: 37 % — ABNORMAL LOW (ref 39.0–52.0)
Hemoglobin: 12.6 g/dL — ABNORMAL LOW (ref 13.0–17.0)
Potassium: 3.4 mmol/L — ABNORMAL LOW (ref 3.5–5.1)
SODIUM: 141 mmol/L (ref 135–145)
TCO2: 25 mmol/L (ref 0–100)

## 2016-10-03 LAB — CBC WITH DIFFERENTIAL/PLATELET
BASOS ABS: 0 10*3/uL (ref 0.0–0.1)
BASOS PCT: 0 %
EOS PCT: 5 %
Eosinophils Absolute: 0.4 10*3/uL (ref 0.0–0.7)
HCT: 37.3 % — ABNORMAL LOW (ref 39.0–52.0)
HEMOGLOBIN: 12.8 g/dL — AB (ref 13.0–17.0)
Lymphocytes Relative: 24 %
Lymphs Abs: 2 10*3/uL (ref 0.7–4.0)
MCH: 28.7 pg (ref 26.0–34.0)
MCHC: 34.3 g/dL (ref 30.0–36.0)
MCV: 83.6 fL (ref 78.0–100.0)
Monocytes Absolute: 0.6 10*3/uL (ref 0.1–1.0)
Monocytes Relative: 7 %
Neutro Abs: 5.5 10*3/uL (ref 1.7–7.7)
Neutrophils Relative %: 64 %
PLATELETS: 243 10*3/uL (ref 150–400)
RBC: 4.46 MIL/uL (ref 4.22–5.81)
RDW: 14.3 % (ref 11.5–15.5)
WBC: 8.5 10*3/uL (ref 4.0–10.5)

## 2016-10-03 LAB — URINALYSIS, ROUTINE W REFLEX MICROSCOPIC
BILIRUBIN URINE: NEGATIVE
GLUCOSE, UA: NEGATIVE mg/dL
Hgb urine dipstick: NEGATIVE
KETONES UR: NEGATIVE mg/dL
LEUKOCYTES UA: NEGATIVE
NITRITE: NEGATIVE
PROTEIN: NEGATIVE mg/dL
Specific Gravity, Urine: 1.006 (ref 1.005–1.030)
pH: 6 (ref 5.0–8.0)

## 2016-10-03 LAB — I-STAT TROPONIN, ED: TROPONIN I, POC: 0 ng/mL (ref 0.00–0.08)

## 2016-10-03 LAB — ETHANOL: Alcohol, Ethyl (B): <5 mg/dL (ref <5)

## 2016-10-03 LAB — APTT: aPTT: 29 seconds (ref 24–36)

## 2016-10-03 MED ORDER — RAMIPRIL 5 MG PO CAPS
5.0000 mg | ORAL_CAPSULE | Freq: Every day | ORAL | Status: DC
  Administered 2016-10-04: 5 mg via ORAL
  Filled 2016-10-03: qty 1

## 2016-10-03 MED ORDER — POTASSIUM CHLORIDE 20 MEQ/15ML (10%) PO SOLN
20.0000 meq | Freq: Once | ORAL | Status: AC
  Administered 2016-10-03: 20 meq via ORAL
  Filled 2016-10-03: qty 15

## 2016-10-03 MED ORDER — IOPAMIDOL (ISOVUE-370) INJECTION 76%
INTRAVENOUS | Status: AC
  Filled 2016-10-03: qty 50

## 2016-10-03 MED ORDER — TEMAZEPAM 15 MG PO CAPS: 15.0000 mg | ORAL_CAPSULE | Freq: Every evening | ORAL | Status: DC | PRN

## 2016-10-03 MED ORDER — METOPROLOL TARTRATE 25 MG PO TABS: 25.0000 mg | ORAL_TABLET | Freq: Once | ORAL | Status: DC

## 2016-10-03 MED ORDER — AMLODIPINE BESYLATE 5 MG PO TABS
10.0000 mg | ORAL_TABLET | Freq: Every day | ORAL | Status: DC
  Administered 2016-10-04 – 2016-10-05 (×2): 10 mg via ORAL
  Filled 2016-10-03 (×2): qty 2

## 2016-10-03 MED ORDER — ASPIRIN 325 MG PO TABS: 325.0000 mg | ORAL_TABLET | Freq: Every day | ORAL | Status: DC

## 2016-10-03 MED ORDER — STROKE: EARLY STAGES OF RECOVERY BOOK
Freq: Once | Status: AC
  Administered 2016-10-03: 22:00:00
  Filled 2016-10-03: qty 1

## 2016-10-03 MED ORDER — ACETAMINOPHEN 325 MG PO TABS: 650.0000 mg | ORAL_TABLET | ORAL | Status: DC | PRN

## 2016-10-03 MED ORDER — ACETAMINOPHEN 650 MG RE SUPP: 650.0000 mg | RECTAL | Status: DC | PRN

## 2016-10-03 MED ORDER — ACETAMINOPHEN 160 MG/5ML PO SOLN: 650.0000 mg | ORAL | Status: DC | PRN

## 2016-10-03 MED ORDER — ASPIRIN 300 MG RE SUPP: 300.0000 mg | Freq: Every day | RECTAL | Status: DC

## 2016-10-03 MED ORDER — INSULIN ASPART 100 UNIT/ML ~~LOC~~ SOLN: 0.0000 [IU] | Freq: Every day | SUBCUTANEOUS | Status: DC

## 2016-10-03 MED ORDER — NITROGLYCERIN 0.4 MG SL SUBL: 0.4000 mg | SUBLINGUAL_TABLET | SUBLINGUAL | Status: DC | PRN

## 2016-10-03 MED ORDER — INSULIN ASPART 100 UNIT/ML ~~LOC~~ SOLN
0.0000 [IU] | Freq: Three times a day (TID) | SUBCUTANEOUS | Status: DC
  Administered 2016-10-04 – 2016-10-05 (×2): 1 [IU] via SUBCUTANEOUS
  Administered 2016-10-05: 2 [IU] via SUBCUTANEOUS

## 2016-10-03 MED ORDER — SENNOSIDES-DOCUSATE SODIUM 8.6-50 MG PO TABS: 1.0000 | ORAL_TABLET | Freq: Every evening | ORAL | Status: DC | PRN

## 2016-10-03 MED ORDER — SODIUM CHLORIDE 0.9 % IV SOLN
INTRAVENOUS | Status: DC
Start: 2016-10-03 — End: 2016-10-05
  Administered 2016-10-03 – 2016-10-05 (×2): via INTRAVENOUS

## 2016-10-03 MED ORDER — ATORVASTATIN CALCIUM 80 MG PO TABS
80.0000 mg | ORAL_TABLET | Freq: Once | ORAL | Status: DC
  Administered 2016-10-03: 80 mg via ORAL
  Filled 2016-10-03: qty 1

## 2016-10-03 MED ORDER — TICAGRELOR 90 MG PO TABS
90.0000 mg | ORAL_TABLET | Freq: Two times a day (BID) | ORAL | Status: DC
  Administered 2016-10-03 – 2016-10-05 (×4): 90 mg via ORAL
  Filled 2016-10-03 (×5): qty 1

## 2016-10-03 MED ORDER — ENOXAPARIN SODIUM 40 MG/0.4ML ~~LOC~~ SOLN
40.0000 mg | SUBCUTANEOUS | Status: DC
  Administered 2016-10-03 – 2016-10-04 (×2): 40 mg via SUBCUTANEOUS
  Filled 2016-10-03 (×2): qty 0.4

## 2016-10-03 MED ORDER — TRAMADOL HCL 50 MG PO TABS: 50.0000 mg | ORAL_TABLET | Freq: Four times a day (QID) | ORAL | Status: DC | PRN

## 2016-10-03 MED ORDER — ASPIRIN 81 MG PO CHEW
81.0000 mg | CHEWABLE_TABLET | Freq: Every day | ORAL | Status: DC
  Administered 2016-10-04: 81 mg via ORAL
  Filled 2016-10-03 (×2): qty 1

## 2016-10-03 MED ORDER — TICAGRELOR 90 MG PO TABS: 90.0000 mg | ORAL_TABLET | Freq: Once | ORAL | Status: DC

## 2016-10-03 MED ORDER — ATORVASTATIN CALCIUM 80 MG PO TABS
80.0000 mg | ORAL_TABLET | Freq: Every day | ORAL | Status: DC
  Administered 2016-10-04: 80 mg via ORAL
  Filled 2016-10-03: qty 1

## 2016-10-03 NOTE — ED Triage Notes (Signed)
Pt from home with right arm drift and left leg weakness/tingling that began at 1030 this morning. Pt observed to have severe right arm drift by this RN and having difficulty speaking certain words. Code stroke paged.
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
## 2016-10-03 NOTE — ED Notes (Signed)
Code Stroke page went out @ 1559.

## 2016-10-03 NOTE — Code Documentation (Signed)
71 y.o. male presents to the [facility] via POV with c/o right arm drift and left leg weakness/tingling that began acutely at 1030 this morning. Triage RN appreciated severe right arm drift and difficulty speaking certain words. Code stroke was then called. Pt to CT for CTH. CT showed chronic appearing anterior Left MCA cortical infarct. No acute cortically based infarct identified. No acute hemorrhage or mass effect. ASPECTS is 10. S/p CT patient was taken back to ED. Labs drawn and pt fully assessed. NIHSS 1 for RUE drift. Patient reports that he has rotator cuff problems on the right side. See EMR for NIHSS documentation and coded times. tPA not given d/t outside the window. Bedside handoff with ED RN Lovena Le.

## 2016-10-04 ENCOUNTER — Observation Stay (HOSPITAL_COMMUNITY): Payer: Medicare HMO

## 2016-10-04 ENCOUNTER — Observation Stay (HOSPITAL_BASED_OUTPATIENT_CLINIC_OR_DEPARTMENT_OTHER): Payer: Medicare HMO

## 2016-10-04 DIAGNOSIS — E784 Other hyperlipidemia: Secondary | ICD-10-CM | POA: Diagnosis not present

## 2016-10-04 DIAGNOSIS — N183 Chronic kidney disease, stage 3 (moderate): Secondary | ICD-10-CM

## 2016-10-04 DIAGNOSIS — I6300 Cerebral infarction due to thrombosis of unspecified precerebral artery: Secondary | ICD-10-CM | POA: Diagnosis not present

## 2016-10-04 DIAGNOSIS — I10 Essential (primary) hypertension: Secondary | ICD-10-CM

## 2016-10-04 DIAGNOSIS — I6789 Other cerebrovascular disease: Secondary | ICD-10-CM | POA: Diagnosis not present

## 2016-10-04 DIAGNOSIS — R531 Weakness: Secondary | ICD-10-CM

## 2016-10-04 LAB — GLUCOSE, CAPILLARY
GLUCOSE-CAPILLARY: 140 mg/dL — AB (ref 65–99)
GLUCOSE-CAPILLARY: 154 mg/dL — AB (ref 65–99)
Glucose-Capillary: 200 mg/dL — ABNORMAL HIGH (ref 65–99)

## 2016-10-04 LAB — VAS US CAROTID
LCCADDIAS: -17 cm/s
LEFT ECA DIAS: -25 cm/s
LEFT VERTEBRAL DIAS: -15 cm/s
Left CCA dist sys: -72 cm/s
Left CCA prox dias: 31 cm/s
Left CCA prox sys: 116 cm/s
Left ICA dist dias: -24 cm/s
Left ICA dist sys: -117 cm/s
Left ICA prox dias: -24 cm/s
Left ICA prox sys: -101 cm/s
RCCADSYS: -100 cm/s
RCCAPDIAS: -12 cm/s
RIGHT CCA MID DIAS: 26 cm/s
RIGHT ECA DIAS: -32 cm/s
RIGHT VERTEBRAL DIAS: 14 cm/s
Right CCA prox sys: -89 cm/s

## 2016-10-04 LAB — LIPID PANEL
Cholesterol: 111 mg/dL (ref 0–200)
HDL: 30 mg/dL — ABNORMAL LOW
LDL Cholesterol: 60 mg/dL (ref 0–99)
Total CHOL/HDL Ratio: 3.7 ratio
Triglycerides: 105 mg/dL
VLDL: 21 mg/dL (ref 0–40)

## 2016-10-04 LAB — ECHOCARDIOGRAM COMPLETE
Height: 67 in
Weight: 2814.83 [oz_av]

## 2016-10-04 MED ORDER — POTASSIUM CHLORIDE 20 MEQ/15ML (10%) PO SOLN
20.0000 meq | Freq: Once | ORAL | Status: AC
  Administered 2016-10-04: 20 meq via ORAL

## 2016-10-04 MED ORDER — ASPIRIN EC 325 MG PO TBEC
325.0000 mg | DELAYED_RELEASE_TABLET | Freq: Every day | ORAL | Status: DC
Start: 2016-10-05 — End: 2016-10-05
  Administered 2016-10-05: 325 mg via ORAL
  Filled 2016-10-04: qty 1

## 2016-10-04 MED ORDER — PERFLUTREN LIPID MICROSPHERE
1.0000 mL | INTRAVENOUS | Status: AC | PRN
  Administered 2016-10-04: 6 mL via INTRAVENOUS
  Filled 2016-10-04: qty 10
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
## 2016-10-04 NOTE — Progress Notes (Signed)
Patient sending home wallet, keys, comb and other unspecified personal items with cousin, Birdena Crandall. Wendee Copp
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
## 2016-10-04 NOTE — Care Management Obs Status (Signed)
[facility] NOTIFICATION   Patient Details  Name: LONDON FRILOUX MRN: [id number] Date of Birth: [birth year]   Medicare Observation Status Notification Given:  Yes    Pollie Friar, RN 10/04/2016, 4:10 PM

## 2016-10-04 NOTE — Care Management Obs Status (Signed)
[place] NOTIFICATION   Patient Details  Name: Gilbert Obrien MRN: [id number] Date of Birth: Jul 07, 1946   Medicare Observation Status Notification Given:  Yes    Pollie Friar, RN 10/04/2016, 4:09 PM

## 2016-10-04 NOTE — Progress Notes (Signed)
  Echocardiogram 2D Echocardiogram has been performed.  Darlina Sicilian M 10/04/2016, 2:50 PM
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
## 2016-10-04 NOTE — Progress Notes (Signed)
*  PRELIMINARY RESULTS* Vascular Ultrasound Carotid Duplex (Doppler) has been completed.  Preliminary findings: Bilateral 1-39% ICA stenosis, antegrade vertebral flow.   Everrett Coombe 10/04/2016, 3:28 PM

## 2016-10-04 NOTE — Care Management Note (Signed)
Case Management Note  Patient Details  Name: Gilbert Obrien MRN: [id number] Date of Birth: [birth year]  Subjective/Objective:                  Patient presented with right arm weakness and difficulty speaking. Comes from home and was independent with ADLs.  Stroke work-up underway.  CM will follow for discharge needs pending PT/OT evals and physician orders.   Action/Plan:   Expected Discharge Date:                  Expected Discharge Plan:     In-House Referral:     Discharge planning Services     Post Acute Care Choice:    Choice offered to:     DME Arranged:    DME Agency:     HH Arranged:    HH Agency:     Status of Service:     If discussed at [employer] of Stay Meetings, dates discussed:    Additional Comments:  Rolm Baptise, RN 10/04/2016, 1:27 PM

## 2016-10-05 ENCOUNTER — Observation Stay (HOSPITAL_BASED_OUTPATIENT_CLINIC_OR_DEPARTMENT_OTHER): Payer: Medicare HMO

## 2016-10-05 DIAGNOSIS — N183 Chronic kidney disease, stage 3 (moderate): Secondary | ICD-10-CM | POA: Diagnosis not present

## 2016-10-05 DIAGNOSIS — I6300 Cerebral infarction due to thrombosis of unspecified precerebral artery: Secondary | ICD-10-CM

## 2016-10-05 DIAGNOSIS — I10 Essential (primary) hypertension: Secondary | ICD-10-CM | POA: Diagnosis not present

## 2016-10-05 DIAGNOSIS — R531 Weakness: Secondary | ICD-10-CM | POA: Diagnosis not present

## 2016-10-05 DIAGNOSIS — E784 Other hyperlipidemia: Secondary | ICD-10-CM | POA: Diagnosis not present

## 2016-10-05 LAB — HEMOGLOBIN A1C
Hgb A1c MFr Bld: 7 % — ABNORMAL HIGH (ref 4.8–5.6)
Mean Plasma Glucose: 154 mg/dL

## 2016-10-05 LAB — CBC
HEMATOCRIT: 34.5 % — AB (ref 39.0–52.0)
Hemoglobin: 11.8 g/dL — ABNORMAL LOW (ref 13.0–17.0)
MCH: 28.6 pg (ref 26.0–34.0)
MCHC: 34.2 g/dL (ref 30.0–36.0)
MCV: 83.5 fL (ref 78.0–100.0)
Platelets: 224 10*3/uL (ref 150–400)
RBC: 4.13 MIL/uL — ABNORMAL LOW (ref 4.22–5.81)
RDW: 14.1 % (ref 11.5–15.5)
WBC: 6.4 10*3/uL (ref 4.0–10.5)

## 2016-10-05 LAB — BASIC METABOLIC PANEL
Anion gap: 4 — ABNORMAL LOW (ref 5–15)
BUN: 11 mg/dL (ref 6–20)
CHLORIDE: 113 mmol/L — AB (ref 101–111)
CO2: 25 mmol/L (ref 22–32)
CREATININE: 1.28 mg/dL — AB (ref 0.61–1.24)
Calcium: 8.6 mg/dL — ABNORMAL LOW (ref 8.9–10.3)
GFR calc non Af Amer: 55 mL/min — ABNORMAL LOW (ref >60)
Glucose, Bld: 154 mg/dL — ABNORMAL HIGH (ref 65–99)
Potassium: 3.8 mmol/L (ref 3.5–5.1)
Sodium: 142 mmol/L (ref 135–145)

## 2016-10-05 LAB — GLUCOSE, CAPILLARY
Glucose-Capillary: 153 mg/dL — ABNORMAL HIGH (ref 65–99)
Glucose-Capillary: 139 mg/dL — ABNORMAL HIGH (ref 65–99)

## 2016-10-05 MED ORDER — ACETAMINOPHEN 325 MG PO TABS: 650.0000 mg | ORAL_TABLET | ORAL | 0 refills | Status: DC | PRN

## 2016-10-05 MED ORDER — METOPROLOL TARTRATE 25 MG PO TABS
25.0000 mg | ORAL_TABLET | Freq: Two times a day (BID) | ORAL | Status: DC
  Administered 2016-10-05: 25 mg via ORAL
  Filled 2016-10-05: qty 1

## 2016-10-05 MED ORDER — ASPIRIN 325 MG PO TBEC: 325.0000 mg | DELAYED_RELEASE_TABLET | Freq: Every day | ORAL | 0 refills | Status: DC
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
## 2016-10-05 NOTE — Progress Notes (Signed)
VASCULAR LAB PRELIMINARY  PRELIMINARY  PRELIMINARY  PRELIMINARY  Transcranial Doppler completed.    Preliminary report:  Transcarnial Doppler completed  Steele Ledonne, RVS 10/05/2016, 1:32 PM
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
## 2016-10-05 NOTE — Discharge Instructions (Signed)
Chronic Kidney Disease, Adult Chronic kidney disease (CKD) happens when the kidneys are damaged during a time of 3 or more months. The kidneys are two organs that do many important jobs in the body. These jobs include:  Removing wastes and extra fluids from the blood.  Making hormones that maintain the amount of fluid in your tissues and blood vessels.  Making sure that the body has the right amount of fluids and chemicals.  Most of the time, this condition does not go away, but it can usually be controlled. Steps must be taken to slow down the kidney damage or stop it from getting worse. Otherwise, the kidneys may stop working. Follow these instructions at home:  Follow your diet as told by your doctor. You may need to avoid alcohol, salty foods (sodium), and foods that are high in potassium, calcium, and protein.  Take over-the-counter and prescription medicines only as told by your doctor. Do not take any new medicines unless your doctor says you can do that. These include vitamins and minerals. ? Medicines and nutritional supplements can make kidney damage worse. ? Your doctor may need to change how much medicine you take.  Do not use any tobacco products. These include cigarettes, chewing tobacco, and e-cigarettes. If you need help quitting, ask your doctor.  Keep all follow-up visits as told by your doctor. This is important.  Check your blood pressure. Tell your doctor if there are changes to your blood pressure.  Get to a healthy weight. Stay at that weight. If you need help with this, ask your doctor.  Start or continue an exercise plan. Try to exercise at least 30 minutes a day, 5 days a week.  Stay up-to-date with your shots (immunizations) as told by your doctor. Contact a doctor if:  Your symptoms get worse.  You have new symptoms. Get help right away if:  You have symptoms of end-stage kidney disease. These include: ? Headaches. ? Skin that is darker or lighter  than normal. ? Numbness in your hands or feet. ? Easy bruising. ? Having hiccups often. ? Chest pain. ? Shortness of breath. ? Stopping of menstrual periods in women.  You have a fever.  You are making very little pee (urine).  You have pain or bleeding when you pee (urinate). This information is not intended to replace advice given to you by your health care provider. Make sure you discuss any questions you have with your health care provider. Document Released: 12/06/2009 Document Revised: 02/17/2016 Document Reviewed: 05/10/2012 Elsevier Interactive Patient Education  2017 [employer] Inc[employer]  
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
## 2016-10-05 NOTE — Progress Notes (Signed)
Pt discharge education and instructions completed with pt; pt voices understanding and denies any questions. Pt handed his prescriptions for aspirin and tylenol. Pt discharge home with family to transport him home. Pt telemetry and IV removed; pt transported off unit via wheelchair with belongings to the side. Francis Gaines Cutler Sunday RN.

## 2016-10-16 DIAGNOSIS — M5010 Cervical disc disorder with radiculopathy, unspecified cervical region: Secondary | ICD-10-CM | POA: Diagnosis not present

## 2016-10-16 DIAGNOSIS — R600 Localized edema: Secondary | ICD-10-CM | POA: Diagnosis not present

## 2016-10-16 DIAGNOSIS — I10 Essential (primary) hypertension: Secondary | ICD-10-CM | POA: Diagnosis not present
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
## 2016-10-30 ENCOUNTER — Encounter: Payer: Self-pay | Admitting: Physician Assistant

## 2016-10-30 ENCOUNTER — Encounter (INDEPENDENT_AMBULATORY_CARE_PROVIDER_SITE_OTHER): Payer: Self-pay

## 2016-10-30 ENCOUNTER — Ambulatory Visit (INDEPENDENT_AMBULATORY_CARE_PROVIDER_SITE_OTHER): Payer: Medicare HMO | Admitting: Physician Assistant

## 2016-10-30 VITALS — BP 152/74 | HR 63 | Ht 67.0 in | Wt 182.0 lb

## 2016-10-30 DIAGNOSIS — Z8673 Personal history of transient ischemic attack (TIA), and cerebral infarction without residual deficits: Secondary | ICD-10-CM | POA: Diagnosis not present

## 2016-10-30 DIAGNOSIS — I10 Essential (primary) hypertension: Secondary | ICD-10-CM

## 2016-10-30 DIAGNOSIS — N183 Chronic kidney disease, stage 3 unspecified: Secondary | ICD-10-CM

## 2016-10-30 DIAGNOSIS — I2510 Atherosclerotic heart disease of native coronary artery without angina pectoris: Secondary | ICD-10-CM | POA: Diagnosis not present

## 2016-10-30 DIAGNOSIS — E784 Other hyperlipidemia: Secondary | ICD-10-CM | POA: Diagnosis not present

## 2016-10-30 DIAGNOSIS — E7849 Other hyperlipidemia: Secondary | ICD-10-CM

## 2016-10-30 MED ORDER — ASPIRIN EC 81 MG PO TBEC: 81.0000 mg | DELAYED_RELEASE_TABLET | Freq: Every day | ORAL | Status: DC

## 2016-10-30 MED ORDER — RAMIPRIL 5 MG PO CAPS: 5.0000 mg | ORAL_CAPSULE | Freq: Every day | ORAL | 3 refills | Status: DC

## 2016-10-30 MED ORDER — ASPIRIN EC 81 MG PO TBEC: 81.0000 mg | DELAYED_RELEASE_TABLET | Freq: Every day | ORAL | 0 refills | Status: DC

## 2016-10-30 NOTE — Patient Instructions (Addendum)
Medication Instructions:  1. STOP ASPIRIN 325 MG  2. START ASPIRIN 81 MG DAILY 3. START RAMIPRIL 5 MG DAILY; RX HAS BEEN SENT IN  Labwork: YOU WILL NEED TO HAVE LAB WORK (BMET) IN 1 WEEK AND THEN AGAIN IN 3 WEEKS (BMET); THESE CAN BE DONE WITH YOUR PRIMARY CARE PHYSICIAN; YOU HAVE BEEN GIVEN 2 RX TO GIVE TO THE PCP FOR THE LAB WORK TO BE DONE ; PLEASE HAVE THE RESULTS FAXED TO Katherine, [place]  Testing/Procedures: NONE  Follow-Up: 1. SCOTT WEAVER, PAC 3 MONTHS 2. Your physician wants you to follow-up in: [place] .  You will receive a reminder letter in the mail two months in advance. If you don't receive a letter, please call our office to schedule the follow-up appointment.  Any Other Special Instructions Will Be Listed Below (If Applicable).  If you need a refill on your cardiac medications before your next appointment, please call your pharmacy.

## 2016-11-06 DIAGNOSIS — N183 Chronic kidney disease, stage 3 (moderate): Secondary | ICD-10-CM | POA: Diagnosis not present

## 2016-11-09 DIAGNOSIS — Z08 Encounter for follow-up examination after completed treatment for malignant neoplasm: Secondary | ICD-10-CM | POA: Diagnosis not present

## 2016-11-09 DIAGNOSIS — C4442 Squamous cell carcinoma of skin of scalp and neck: Secondary | ICD-10-CM | POA: Diagnosis not present

## 2016-11-09 DIAGNOSIS — Z85828 Personal history of other malignant neoplasm of skin: Secondary | ICD-10-CM | POA: Diagnosis not present

## 2016-11-09 DIAGNOSIS — D485 Neoplasm of uncertain behavior of skin: Secondary | ICD-10-CM | POA: Diagnosis not present

## 2016-11-09 DIAGNOSIS — C44329 Squamous cell carcinoma of skin of other parts of face: Secondary | ICD-10-CM | POA: Diagnosis not present

## 2016-11-27 DIAGNOSIS — N183 Chronic kidney disease, stage 3 (moderate): Secondary | ICD-10-CM | POA: Diagnosis not present

## 2016-12-04 ENCOUNTER — Telehealth: Payer: Self-pay | Admitting: Cardiovascular Disease

## 2016-12-04 NOTE — Telephone Encounter (Signed)
I spoke with the pt and he needs a Tier exception completed for Brilinta.  The pt provided me with [facility] clinical pharmacy review department phone # [phone]. Member #[id number].  When calling this number I was advised to send request through cover my meds.  Request completed. Will await review from insurance.

## 2016-12-04 NOTE — Telephone Encounter (Signed)
New Message  Pt voiced he is wanting to speak with the nurse he stated he just need to talk to nurse.  Please f/u

## 2016-12-08 NOTE — Telephone Encounter (Signed)
I will forward this message to Jeani Hawking to follow-up on the tier exception that I initiated for the pt on 12/04/16.  I have not heard back from cover my meds.

## 2016-12-08 NOTE — Telephone Encounter (Signed)
Just checked Dr Antionette Char folder and I did receive a fax from [facility] that the pt's Tier exception request was approved through 09/24/17. Brilinta is now a Tier 2 covered medication. I left a voicemail to make the pt aware.

## 2016-12-14 DIAGNOSIS — L570 Actinic keratosis: Secondary | ICD-10-CM | POA: Diagnosis not present

## 2016-12-14 DIAGNOSIS — C4442 Squamous cell carcinoma of skin of scalp and neck: Secondary | ICD-10-CM | POA: Diagnosis not present

## 2017-01-23 DIAGNOSIS — C44329 Squamous cell carcinoma of skin of other parts of face: Secondary | ICD-10-CM | POA: Diagnosis not present

## 2017-01-29 ENCOUNTER — Ambulatory Visit: Payer: Medicare HMO | Admitting: Physician Assistant
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
## 2017-02-07 ENCOUNTER — Encounter: Payer: Self-pay | Admitting: Physician Assistant

## 2017-02-07 ENCOUNTER — Ambulatory Visit (INDEPENDENT_AMBULATORY_CARE_PROVIDER_SITE_OTHER): Payer: Medicare HMO | Admitting: Physician Assistant

## 2017-02-07 VITALS — BP 150/62 | HR 68 | Ht 67.0 in | Wt 183.1 lb

## 2017-02-07 DIAGNOSIS — Z8673 Personal history of transient ischemic attack (TIA), and cerebral infarction without residual deficits: Secondary | ICD-10-CM | POA: Diagnosis not present

## 2017-02-07 DIAGNOSIS — N183 Chronic kidney disease, stage 3 unspecified: Secondary | ICD-10-CM

## 2017-02-07 DIAGNOSIS — I10 Essential (primary) hypertension: Secondary | ICD-10-CM

## 2017-02-07 DIAGNOSIS — E785 Hyperlipidemia, unspecified: Secondary | ICD-10-CM | POA: Diagnosis not present

## 2017-02-07 DIAGNOSIS — I2510 Atherosclerotic heart disease of native coronary artery without angina pectoris: Secondary | ICD-10-CM | POA: Diagnosis not present

## 2017-02-07 MED ORDER — RAMIPRIL 5 MG PO CAPS: 5.0000 mg | ORAL_CAPSULE | Freq: Two times a day (BID) | ORAL | 3 refills | Status: DC

## 2017-02-07 NOTE — Patient Instructions (Addendum)
Medication Instructions:  Increase Ramipril to 5 mg Twice daily (about every 12 hours)  Labwork: In 2 weeks - BMET (you can get it done with your primary care doctor and have it faxed here) [phone] FAX #   Testing/Procedures: None   Follow-Up: Your physician wants you to follow-up in: Dr. Sherren Mocha in 6 months  You will receive a reminder letter in the mail two months in advance. If you don't receive a letter, please call our office to schedule the follow-up appointment.  Any Other Special Instructions Will Be Listed Below (If Applicable). Try to wear compression stockings (socks) for your leg swelling. Call if the swelling is getting worse. You can try an extra Lasix on days it seems to be worse. Call me if you end up taking Lasix a lot during a week.  If you need a refill on your cardiac medications before your next appointment, please call your pharmacy.

## 2017-02-21 DIAGNOSIS — N183 Chronic kidney disease, stage 3 (moderate): Secondary | ICD-10-CM | POA: Diagnosis not present

## 2017-02-22 ENCOUNTER — Telehealth: Payer: Self-pay | Admitting: *Deleted

## 2017-02-22 NOTE — Telephone Encounter (Signed)
Lmtcb to go over lab results 

## 2017-02-22 NOTE — Telephone Encounter (Signed)
Pt has been notified of lab results by phone with verbal understanding. Pt thanked me for my call today.   

## 2017-02-22 NOTE — Telephone Encounter (Signed)
-----   Message from Liliane Shi, [place] sent at 02/22/2017  3:20 PM EDT ----- Please call the patient Kidney function is stable. Continue with current treatment plan. Richardson Dopp, PA-C   02/22/2017 3:20 PM

## 2017-02-22 NOTE — Telephone Encounter (Signed)
Follow up ° ° ° ° ° °Returning a call to the nurse to get lab results °

## 2017-03-20 DIAGNOSIS — I2510 Atherosclerotic heart disease of native coronary artery without angina pectoris: Secondary | ICD-10-CM | POA: Diagnosis not present

## 2017-03-20 DIAGNOSIS — E1122 Type 2 diabetes mellitus with diabetic chronic kidney disease: Secondary | ICD-10-CM | POA: Diagnosis not present

## 2017-03-20 DIAGNOSIS — Z0000 Encounter for general adult medical examination without abnormal findings: Secondary | ICD-10-CM | POA: Diagnosis not present

## 2017-03-30 DIAGNOSIS — Z0000 Encounter for general adult medical examination without abnormal findings: Secondary | ICD-10-CM | POA: Diagnosis not present

## 2017-03-30 DIAGNOSIS — I2510 Atherosclerotic heart disease of native coronary artery without angina pectoris: Secondary | ICD-10-CM | POA: Diagnosis not present

## 2017-03-30 DIAGNOSIS — E1122 Type 2 diabetes mellitus with diabetic chronic kidney disease: Secondary | ICD-10-CM | POA: Diagnosis not present

## 2017-05-21 ENCOUNTER — Encounter: Payer: Self-pay | Admitting: Cardiovascular Disease

## 2017-05-21 ENCOUNTER — Ambulatory Visit (INDEPENDENT_AMBULATORY_CARE_PROVIDER_SITE_OTHER): Payer: Medicare HMO | Admitting: Cardiovascular Disease

## 2017-05-21 VITALS — BP 118/80 | HR 58 | Ht 67.0 in | Wt 181.6 lb

## 2017-05-21 DIAGNOSIS — I2510 Atherosclerotic heart disease of native coronary artery without angina pectoris: Secondary | ICD-10-CM

## 2017-05-21 NOTE — Progress Notes (Signed)
Cardiology Office Note Date:  05/29/2017   ID:  Gilbert Obrien, DOB 02/10/1946, MRN [id number]  PCP:  [name], MD  Cardiologist:  Sherren Mocha, MD    Chief Complaint  Patient presents with  . Follow-up    CAD     History of Present Illness: Gilbert Obrien is a 71 y.o. male who presents for follow-up of coronary artery disease, hypertension, diabetes, and hyperlipidemia. The patient initially presented with an inferoposterior STEMI in 2017 treated with primary PCI to left circumflex. He then underwent staged PCI of the LAD and orbital atherectomy with PCI of the RCA during his index hospitalization. The patient was noted to have moderate LV systolic dysfunction with an LVEF of 35-40% at the time of his presentation. Since his MI in July 2017, he was readmitted in November with acute cholecystitis and again in January 2018 with a suspected left brain stroke. A follow-up echocardiogram demonstrated normal LV systolic function and carotid ultrasound showed no significant ICA stenosis bilaterally.  The patient is doing better. He denies chest pain or chest pressure. He denies orthopnea, PND, or exertional dyspnea. He's had no bleeding problems.  Past Medical History:  Diagnosis Date  . Arthritis   . Coronary artery disease    a. STEMI 03/2016 DESx1 to LCx, DES x 1 Mid LAD, DES x1 prox RCA  . Depression   . Diabetes mellitus without complication ([place])    type 2  . History of acute inferior wall myocardial infarction 04/15/2016   inf-lat/post >> PCI with DES of LCx; staged PCI of LAD and RCA  . Hypercholesteremia   . Hypertension   . Ischemic cardiomyopathy 04/19/2016   A. Inf-lat/post STEMI 7/17 >> b. Echo 04/17/16: Septal and post lateral HK, poor image quality, EF 35-40% // b. Echo 11/17: mild LVH, EF 50-55, inf-lat and ant-lat HK, Gr 1 DD, borderline dilated aortic root (37 mm), MAC    Past Surgical History:  Procedure Laterality Date  . CARDIAC CATHETERIZATION N/A  04/15/2016   Procedure: Left Heart Cath and Coronary Angiography;  Surgeon: Sherren Mocha, MD;  Location: [facility];  Service: Cardiovascular;  Laterality: N/A;  . CARDIAC CATHETERIZATION N/A 04/15/2016   Procedure: Coronary Stent Intervention;  Surgeon: Sherren Mocha, MD;  Location: [facility];  Service: Cardiovascular;  Laterality: N/A;  . CARDIAC CATHETERIZATION N/A 04/17/2016   Procedure: Coronary Stent Intervention;  Surgeon: Burnell Blanks, MD;  Location: [facility];  Service: Cardiovascular;  Laterality: N/A;  . CARDIAC CATHETERIZATION N/A 04/18/2016   Procedure: Coronary Stent Intervention;  Surgeon: Burnell Blanks, MD;  Location: [facility];  Service: Cardiovascular;  Laterality: N/A;  . CARDIAC CATHETERIZATION N/A 04/18/2016   Procedure: Temporary Pacemaker;  Surgeon: Burnell Blanks, MD;  Location: [facility];  Service: Cardiovascular;  Laterality: N/A;  . CORONARY STENT PLACEMENT  04/17/2016    Severe stenosis proximal LAD, now s/p successful PTCA/DES x 1 proximal and mid LAD  . COSMETIC SURGERY  1974   right side facial       . SHOULDER SURGERY      Current Outpatient Prescriptions  Medication Sig Dispense Refill  . acetaminophen (TYLENOL) 325 MG tablet Take 2 tablets (650 mg total) by mouth every 4 (four) hours as needed for mild pain (or temp > 37.5 C (99.5 F)). 30 tablet 0  . amLODipine (NORVASC) 5 MG tablet Take 5 mg by mouth daily.    Marland Kitchen atorvastatin (  LIPITOR) 80 MG tablet Take 1 tablet (80 mg total) by mouth daily at 6 PM. 30 tablet 0  . furosemide (LASIX) 20 MG tablet Take 20 mg by mouth 3 (three) times a week. MON WED AND FRI    . metoprolol tartrate (LOPRESSOR) 25 MG tablet Take 1 tablet (25 mg total) by mouth 2 (two) times daily. 60 tablet 0  . nitroGLYCERIN (NITROSTAT) 0.4 MG SL tablet Place 1 tablet (0.4 mg total) under the tongue every 5 (five) minutes x 3 doses as needed for chest pain. 25 tablet 1  .  ramipril (ALTACE) 5 MG capsule Take 1 capsule (5 mg total) by mouth 2 (two) times daily. 180 capsule 3  . temazepam (RESTORIL) 15 MG capsule Take 15 mg by mouth at bedtime as needed for sleep.    . ticagrelor (BRILINTA) 90 MG TABS tablet Take 1 tablet (90 mg total) by mouth 2 (two) times daily. 60 tablet 0  . traMADol (ULTRAM) 50 MG tablet Take 50 mg by mouth every 6 (six) hours as needed for moderate pain.     No current facility-administered medications for this visit.     Allergies:   Celecoxib and Sulfa antibiotics   Social History:  The patient  reports that he quit smoking about 16 years ago. He has never used smokeless tobacco. He reports that he does not drink alcohol or use drugs.   Family History:  The patient's  family history includes Heart attack in his brother; Heart disease in his brother.    ROS:  Please see the history of present illness.  Otherwise, review of systems is positive for Depression, easy bruising, constipation, balance problems, headaches.  All other systems are reviewed and negative.    PHYSICAL EXAM: VS:  BP 118/80   Pulse (!) 58   Ht 5' 7"  (1.702 m)   Wt 181 lb 9.6 oz (82.4 kg)   SpO2 98%   BMI 28.44 kg/m  , BMI Body mass index is 28.44 kg/m. GEN: Well nourished, well developed, in no acute distress  HEENT: normal  Neck: no JVD, no masses. No carotid bruits Cardiac: RRR without murmur or gallop                Respiratory:  clear to auscultation bilaterally, normal work of breathing GI: soft, nontender, nondistended, + BS MS: no deformity or atrophy  Ext: trace bilateral pretibial edema, pedal pulses 2+= bilaterally Skin: warm and dry, no rash Neuro:  Strength and sensation are intact Psych: euthymic mood, full affect  EKG:  EKG is not ordered today.  Recent Labs: 08/15/2016: B Natriuretic Peptide 21.0 10/03/2016: ALT 21 10/05/2016: BUN 11; Creatinine, Ser 1.28; Hemoglobin 11.8; Platelets 224; Potassium 3.8; Sodium 142   Lipid Panel       Component Value Date/Time   CHOL 111 10/04/2016 0634   TRIG 105 10/04/2016 0634   HDL 30 (L) 10/04/2016 0634   CHOLHDL 3.7 10/04/2016 0634   VLDL 21 10/04/2016 0634   LDLCALC 60 10/04/2016 0634      Wt Readings from Last 3 Encounters:  05/21/17 181 lb 9.6 oz (82.4 kg)  02/07/17 183 lb 1.9 oz (83.1 kg)  10/30/16 182 lb (82.6 kg)     ASSESSMENT AND PLAN: 1.  CAD, native vessel, without angina: The patient is stable on his current medical program. Recommend that he can discontinue aspirin. He will continue his other medications without change. He remains on antiplatelet therapy with brilinta, a high intensity statin drug,  and a beta blocker.  2. History of stroke: He continues on brilinta and a statin drug.  3. Hypertension: Blood pressure is now well controlled. He was advised to continue his current medical regimen which is reviewed today. Most recent labs reviewed with a creatinine of 1.28 mg/dL  4. Hyperlipidemia: Continue atorvastatin 80 mg daily. LDL cholesterol is at goal of less than 70 mg/dL  Current medicines are reviewed with the patient today.  The patient does not have concerns regarding medicines.  Labs/ tests ordered today include:   Orders Placed This Encounter  Procedures  . EKG 12-Lead    Disposition:   FU 6 months with APP  Signed, Sherren Mocha, MD  05/29/2017 4:59 PM    [facility] [address], [place], [place]  [address] Phone: [phone]; Fax: [phone]

## 2017-05-21 NOTE — Patient Instructions (Signed)
Medication Instructions:  Your physician has recommended you make the following change in your medication:   STOP aspirin   Labwork: None ordered  Testing/Procedures: None ordered  Follow-Up: Your physician wants you to follow-up in: 6 months with APP on Dr. Antionette Char team. You will receive a reminder letter in the mail two months in advance. If you don't receive a letter, please call our office to schedule the follow-up appointment.    Your physician wants you to follow-up in: 1 year with Dr. [name]. You will receive a reminder letter in the mail two months in advance. If you don't receive a letter, please call our office to schedule the follow-up appointment.   Any Other Special Instructions Will Be Listed Below (If Applicable).     If you need a refill on your cardiac medications before your next appointment, please call your pharmacy.

## 2017-06-04 ENCOUNTER — Telehealth: Payer: Self-pay

## 2017-06-04 ENCOUNTER — Telehealth: Payer: Self-pay | Admitting: Cardiovascular Disease

## 2017-06-04 MED ORDER — TICAGRELOR 90 MG PO TABS: 90.0000 mg | ORAL_TABLET | Freq: Two times a day (BID) | ORAL | 3 refills | Status: DC

## 2017-06-04 NOTE — Telephone Encounter (Signed)
New message     *STAT* If patient is at the pharmacy, call can be transferred to refill team.   1. Which medications need to be refilled? (please list name of each medication and dose if known) ticagrelor (BRILINTA) 90 MG TABS tablet  2. Which pharmacy/location (including street and city if local pharmacy) is medication to be sent to? [facility] mail order  [phone]  3. Do they need a 30 day or 90 day supply? 90 days supply

## 2017-06-07 ENCOUNTER — Other Ambulatory Visit: Payer: Self-pay | Admitting: *Deleted

## 2017-06-07 MED ORDER — TICAGRELOR 90 MG PO TABS: 90.0000 mg | ORAL_TABLET | Freq: Two times a day (BID) | ORAL | 3 refills | Status: DC

## 2017-06-07 NOTE — Telephone Encounter (Signed)
New message    *STAT* If patient is at the pharmacy, call can be transferred to refill team.   1. Which medications need to be refilled? (please list name of each medication and dose if known) Brilinta 90 mg  2. Which pharmacy/location (including street and city if local pharmacy) is medication to be sent to?[facility] pharmacy [phone]  3. Do they need a 30 day or 90 day supply? 90 day

## 2017-07-19 DIAGNOSIS — E1122 Type 2 diabetes mellitus with diabetic chronic kidney disease: Secondary | ICD-10-CM | POA: Diagnosis not present

## 2017-07-23 DIAGNOSIS — R1084 Generalized abdominal pain: Secondary | ICD-10-CM | POA: Diagnosis not present

## 2017-07-23 DIAGNOSIS — E1122 Type 2 diabetes mellitus with diabetic chronic kidney disease: Secondary | ICD-10-CM | POA: Diagnosis not present

## 2017-07-23 DIAGNOSIS — Z125 Encounter for screening for malignant neoplasm of prostate: Secondary | ICD-10-CM | POA: Diagnosis not present

## 2017-07-23 DIAGNOSIS — I255 Ischemic cardiomyopathy: Secondary | ICD-10-CM | POA: Diagnosis not present

## 2017-07-23 DIAGNOSIS — E782 Mixed hyperlipidemia: Secondary | ICD-10-CM | POA: Diagnosis not present

## 2017-09-11 ENCOUNTER — Other Ambulatory Visit: Payer: Self-pay | Admitting: *Deleted

## 2017-09-11 MED ORDER — NITROGLYCERIN 0.4 MG SL SUBL: 0.4000 mg | SUBLINGUAL_TABLET | SUBLINGUAL | 3 refills | Status: DC | PRN
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
## 2017-10-31 ENCOUNTER — Encounter: Payer: Self-pay | Admitting: Physician Assistant

## 2017-10-31 ENCOUNTER — Ambulatory Visit: Payer: Medicare HMO | Admitting: Cardiology

## 2017-10-31 ENCOUNTER — Encounter (INDEPENDENT_AMBULATORY_CARE_PROVIDER_SITE_OTHER): Payer: Self-pay

## 2017-10-31 VITALS — BP 124/70 | HR 67 | Ht 67.0 in | Wt 181.8 lb

## 2017-10-31 DIAGNOSIS — I5033 Acute on chronic diastolic (congestive) heart failure: Secondary | ICD-10-CM

## 2017-10-31 DIAGNOSIS — E7849 Other hyperlipidemia: Secondary | ICD-10-CM | POA: Diagnosis not present

## 2017-10-31 DIAGNOSIS — I2510 Atherosclerotic heart disease of native coronary artery without angina pectoris: Secondary | ICD-10-CM | POA: Diagnosis not present

## 2017-10-31 DIAGNOSIS — I10 Essential (primary) hypertension: Secondary | ICD-10-CM | POA: Diagnosis not present

## 2017-10-31 DIAGNOSIS — I5043 Acute on chronic combined systolic (congestive) and diastolic (congestive) heart failure: Secondary | ICD-10-CM

## 2017-10-31 DIAGNOSIS — Z8673 Personal history of transient ischemic attack (TIA), and cerebral infarction without residual deficits: Secondary | ICD-10-CM

## 2017-10-31 LAB — HEPATIC FUNCTION PANEL
ALT: 17 IU/L (ref 0–44)
AST: 9 IU/L (ref 0–40)
Albumin: 4.4 g/dL (ref 3.5–4.8)
Alkaline Phosphatase: 88 IU/L (ref 39–117)
Bilirubin Total: 1.1 mg/dL (ref 0.0–1.2)
Bilirubin, Direct: 0.27 mg/dL (ref 0.00–0.40)
TOTAL PROTEIN: 6.8 g/dL (ref 6.0–8.5)

## 2017-10-31 LAB — LIPID PANEL
CHOL/HDL RATIO: 3.2 ratio (ref 0.0–5.0)
Cholesterol, Total: 107 mg/dL (ref 100–199)
HDL: 33 mg/dL — AB (ref >39)
LDL CALC: 57 mg/dL (ref 0–99)
TRIGLYCERIDES: 85 mg/dL (ref 0–149)
VLDL CHOLESTEROL CAL: 17 mg/dL (ref 5–40)

## 2017-10-31 LAB — BASIC METABOLIC PANEL
BUN/Creatinine Ratio: 10 (ref 10–24)
BUN: 14 mg/dL (ref 8–27)
CALCIUM: 9.2 mg/dL (ref 8.6–10.2)
CO2: 22 mmol/L (ref 20–29)
CREATININE: 1.44 mg/dL — AB (ref 0.76–1.27)
Chloride: 105 mmol/L (ref 96–106)
GFR, EST AFRICAN AMERICAN: 56 mL/min/{1.73_m2} — AB (ref >59)
GFR, EST NON AFRICAN AMERICAN: 48 mL/min/{1.73_m2} — AB (ref >59)
Glucose: 151 mg/dL — ABNORMAL HIGH (ref 65–99)
Potassium: 4 mmol/L (ref 3.5–5.2)
Sodium: 140 mmol/L (ref 134–144)

## 2017-10-31 MED ORDER — CLOPIDOGREL BISULFATE 75 MG PO TABS: 75.0000 mg | ORAL_TABLET | Freq: Every day | ORAL | 3 refills | Status: DC

## 2017-10-31 MED ORDER — FUROSEMIDE 20 MG PO TABS: 20.0000 mg | ORAL_TABLET | Freq: Every day | ORAL | 3 refills | Status: DC

## 2017-10-31 NOTE — Addendum Note (Signed)
Addended by: Daune Perch D on: 10/31/2017 05:36 PM   Modules accepted: Orders

## 2017-10-31 NOTE — Patient Instructions (Addendum)
Medication Instructions:  1. TODAY TAKE LASIX 40 MG; STARTING TOMORROW YOU WILL CHANGE TO LASIX 20 MG 1 TABLET EVERYDAY  Labwork: 1. TODAY BMET, LIPIDS, LFT  2. BMET TO BE DONE IN 1 WEEK ; CAN DONE WITH PCP IN 1 WEEK; IF SO PLEASE FAX RESULTS TO [phone]  Testing/Procedures: NONE ORDERED TODAY  Follow-Up: DR. [name] OR APP ON CARE TEAM TO BE SEEN IN 3-4 WEEKS; SCOTT WEAVER, [facility] 11/23/17 @ 9:15   Any Other Special Instructions Will Be Listed Below (If Applicable).     If you need a refill on your cardiac medications before your next appointment, please call your pharmacy.

## 2017-11-01 ENCOUNTER — Telehealth: Payer: Self-pay | Admitting: Cardiology

## 2017-11-01 NOTE — Telephone Encounter (Signed)
Follow Up:    Returning call from Legrand Como from yesterday,cocnerning his lab results.

## 2017-11-01 NOTE — Telephone Encounter (Signed)
Notified the pt of lab results and recommendations to continue his current regimen, per Wallis Bamberg NP.  Pt verbalized understanding and agrees with this plan.

## 2017-11-09 DIAGNOSIS — E1122 Type 2 diabetes mellitus with diabetic chronic kidney disease: Secondary | ICD-10-CM | POA: Diagnosis not present

## 2017-11-09 DIAGNOSIS — E782 Mixed hyperlipidemia: Secondary | ICD-10-CM | POA: Diagnosis not present

## 2017-11-09 DIAGNOSIS — Z125 Encounter for screening for malignant neoplasm of prostate: Secondary | ICD-10-CM | POA: Diagnosis not present

## 2017-11-15 ENCOUNTER — Encounter: Payer: Self-pay | Admitting: Physician Assistant

## 2017-11-16 DIAGNOSIS — E1122 Type 2 diabetes mellitus with diabetic chronic kidney disease: Secondary | ICD-10-CM | POA: Diagnosis not present

## 2017-11-16 DIAGNOSIS — E782 Mixed hyperlipidemia: Secondary | ICD-10-CM | POA: Diagnosis not present

## 2017-11-16 DIAGNOSIS — N183 Chronic kidney disease, stage 3 (moderate): Secondary | ICD-10-CM | POA: Diagnosis not present

## 2017-11-16 DIAGNOSIS — Z0000 Encounter for general adult medical examination without abnormal findings: Secondary | ICD-10-CM | POA: Diagnosis not present

## 2017-11-16 DIAGNOSIS — I5023 Acute on chronic systolic (congestive) heart failure: Secondary | ICD-10-CM | POA: Diagnosis not present

## 2017-11-21 ENCOUNTER — Ambulatory Visit: Payer: Medicare HMO | Admitting: Physician Assistant

## 2017-11-23 ENCOUNTER — Ambulatory Visit: Payer: Medicare HMO | Admitting: Physician Assistant

## 2017-11-23 ENCOUNTER — Telehealth: Payer: Self-pay | Admitting: *Deleted

## 2017-11-23 ENCOUNTER — Encounter: Payer: Self-pay | Admitting: Physician Assistant

## 2017-11-23 VITALS — BP 170/78 | HR 70 | Ht 67.0 in | Wt 182.4 lb

## 2017-11-23 DIAGNOSIS — I10 Essential (primary) hypertension: Secondary | ICD-10-CM

## 2017-11-23 DIAGNOSIS — M79604 Pain in right leg: Secondary | ICD-10-CM | POA: Diagnosis not present

## 2017-11-23 DIAGNOSIS — N183 Chronic kidney disease, stage 3 unspecified: Secondary | ICD-10-CM

## 2017-11-23 DIAGNOSIS — I2510 Atherosclerotic heart disease of native coronary artery without angina pectoris: Secondary | ICD-10-CM

## 2017-11-23 DIAGNOSIS — I5032 Chronic diastolic (congestive) heart failure: Secondary | ICD-10-CM | POA: Diagnosis not present

## 2017-11-23 LAB — BASIC METABOLIC PANEL
BUN / CREAT RATIO: 9 — AB (ref 10–24)
BUN: 12 mg/dL (ref 8–27)
CHLORIDE: 106 mmol/L (ref 96–106)
CO2: 24 mmol/L (ref 20–29)
Calcium: 8.9 mg/dL (ref 8.6–10.2)
Creatinine, Ser: 1.28 mg/dL — ABNORMAL HIGH (ref 0.76–1.27)
GFR calc non Af Amer: 56 mL/min/{1.73_m2} — ABNORMAL LOW (ref >59)
GFR, EST AFRICAN AMERICAN: 64 mL/min/{1.73_m2} (ref >59)
Glucose: 158 mg/dL — ABNORMAL HIGH (ref 65–99)
POTASSIUM: 4.1 mmol/L (ref 3.5–5.2)
Sodium: 143 mmol/L (ref 134–144)
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
## 2017-11-23 NOTE — Telephone Encounter (Signed)
-----   Message from Liliane Shi, [place] sent at 11/23/2017  4:59 PM EST ----- Renal function stable.  The potassium is normal. Continue current medications and follow up as planned.  Richardson Dopp, PA-C    11/23/2017 4:58 PM

## 2017-11-23 NOTE — Telephone Encounter (Signed)
Pt has been notified of lab results by phone with verbal understanding. Pt thanked me for my call.  

## 2017-11-23 NOTE — Patient Instructions (Addendum)
Medication Instructions:  No changes  Labwork: Today - BMET  Testing/Procedures: Your physician has requested that you have a lower extremity arterial exercise duplex. During this test, exercise and ultrasound are used to evaluate arterial blood flow in the legs. Allow one hour for this exam. There are no restrictions or special instructions.   Follow-Up: Sherren Mocha, MD in 6 months; WE WILL SEND OUT A REMINDER LETTER   Any Other Special Instructions Will Be Listed Below (If Applicable). 1. Check your blood pressure 3 times a week for 2-3 weeks and send me the readings. 2. You can check the machine at a fire station to see if it is working correctly. 3. You can wear compression stockings (buy at pharmacy, retail store or sporting goods store) when you will be on your feet to help with swelling.  If you need a refill on your cardiac medications before your next appointment, please call your pharmacy.

## 2017-11-26 ENCOUNTER — Telehealth: Payer: Self-pay | Admitting: Cardiovascular Disease

## 2017-11-26 NOTE — Telephone Encounter (Signed)
New Message:  Pt seen Gilbert Obrien and was told to give readings of BP  Pt c/o BP issue: STAT if pt c/o blurred vision, one-sided weakness or slurred speech  1. What are your last 5 BP readings? 210/110: Last check at the fire station  2. Are you having any other symptoms (ex. Dizziness, headache, blurred vision, passed out)? Some blurred vision on going  3. What is your BP issue? Discussed with nurse

## 2017-11-26 NOTE — Telephone Encounter (Signed)
Spoke with patient in regards to his BP 210/110 concern.. I had him recheck it while on the phone and it was 193/94..  I advised him to take an extra dose of Metoprolol Tartrate (Lopressor) 25 mg..  He will give [place] a call and update @ 4:50pm..  At his OV on 3/1 his BP was 170/78.Marland Kitchen  He denies headache or CP.Marland Kitchen

## 2017-11-27 ENCOUNTER — Telehealth: Payer: Self-pay | Admitting: Cardiovascular Disease

## 2017-11-27 MED ORDER — CARVEDILOL 6.25 MG PO TABS: 6.2500 mg | ORAL_TABLET | Freq: Two times a day (BID) | ORAL | 0 refills | Status: DC

## 2017-11-27 MED ORDER — AMLODIPINE BESYLATE 10 MG PO TABS: 10.0000 mg | ORAL_TABLET | Freq: Every day | ORAL | 0 refills | Status: DC

## 2017-11-27 NOTE — Telephone Encounter (Signed)
Instructed patient to INCREASE AMLODIPINE to 10 mg daily. He will stop metoprolol and START COREG 6.25 mg BID. He will keep a BP log and bring to HTN Clinic 3/21. He was grateful for call and agrees with treatment plan.   Medications called in to local pharmacy. If no changes are made at HTN Clinic visit, he requests 90 day supplies to his mail order.

## 2017-11-27 NOTE — Telephone Encounter (Signed)
Pt c/o BP issue: STAT if pt c/o blurred vision, one-sided weakness or slurred speech  1. What are your last 5 BP readings? 199/91, 192/98 2. Are you having any other symptoms (ex. Dizziness, headache, blurred vision, passed out)? No 3. What is your BP issue? Elevated

## 2017-11-27 NOTE — Telephone Encounter (Signed)
Follow up    Patient is calling back in reference to his blood pressure. He says his BP 184/86. Please call to discuss.

## 2017-11-27 NOTE — Telephone Encounter (Signed)
Please call him on [phone]. He is using someone else phone

## 2017-11-27 NOTE — Telephone Encounter (Signed)
Spoke with patient today regarding his hypertension. Following BPs: 3/4 7p 185/87; 9p 184/80; 11p 177/80;  3/5 1a 162/93; 8a 184/90 11:45 184/90  Asymptomatic with these; please advise, thank you.. Taking medication as prescribed

## 2017-11-27 NOTE — Telephone Encounter (Signed)
Would start by increasing amlodipine to 10 mg and changing metoprolol to carvedilol 6.25 mg BID. Keep a BP log 3 days/week and FU HTN Clinic. thanks

## 2017-11-27 NOTE — Telephone Encounter (Signed)
Left patient message to follow up on his BP recordings throughout the evening and into this morning.Marland Kitchen

## 2017-11-27 NOTE — Telephone Encounter (Signed)
Instructed patient to take night time dose of amlodipine and coreg NOW. He will continue to monitor blood pressure and call if it does not improve or symptoms occur. He was grateful for assistance.

## 2017-11-28 ENCOUNTER — Other Ambulatory Visit: Payer: Self-pay | Admitting: Physician Assistant

## 2017-11-28 DIAGNOSIS — M79604 Pain in right leg: Secondary | ICD-10-CM

## 2017-12-12 ENCOUNTER — Ambulatory Visit (HOSPITAL_COMMUNITY)
Admission: RE | Admit: 2017-12-12 | Discharge: 2017-12-12 | Disposition: A | Discharge status: Home / Self Care | Payer: Medicare HMO | Source: Ambulatory Visit | Attending: Cardiovascular Disease | Admitting: Cardiovascular Disease

## 2017-12-12 DIAGNOSIS — M79604 Pain in right leg: Secondary | ICD-10-CM | POA: Diagnosis not present

## 2017-12-13 ENCOUNTER — Ambulatory Visit (INDEPENDENT_AMBULATORY_CARE_PROVIDER_SITE_OTHER): Payer: Medicare HMO | Admitting: Pharmacist

## 2017-12-13 ENCOUNTER — Encounter: Payer: Self-pay | Admitting: Physician Assistant

## 2017-12-13 VITALS — BP 142/76 | HR 71

## 2017-12-13 DIAGNOSIS — I10 Essential (primary) hypertension: Secondary | ICD-10-CM

## 2017-12-13 MED ORDER — CARVEDILOL 12.5 MG PO TABS: 12.5000 mg | ORAL_TABLET | Freq: Two times a day (BID) | ORAL | 0 refills | Status: DC

## 2017-12-13 NOTE — Progress Notes (Signed)
Patient ID: Gilbert Obrien                 DOB: [birth year]                      MRN: [id number]     HPI: Gilbert Obrien is a 72 y.o. male patient of Dr. [name] who presents today for hypertension evaluation. PMH significant for coronary artery disease, diabetes, chronic kidney disease, hypertension, hyperlipidemia.  He was admitted in 03/2016 with and inferolateral/posterior STEMI. LHC demonstrated subtotal occlusion of the LCx as well as severe stenosis in the proximal LAD and proximal RCA with overall preserved LVEF. He recently called with BP 210/110 with blurred vision and he was instructed to take extra metoprolol. His metoprolol was then changed to carvedilol and amlodipine was increased.    He presents today for additional BP management.  He states he has been doing well on current regimen. He still had elevated pressures though. He denies chest pain, dizziness, SOB and headache. He is very concerned about his pressures running high.   Current HTN meds:  Amlodipine 61m daily in the morning Carvedilol 6.284mBID Furosemide 2075maily  Ramipril 5mg50mD  Previously tried: none   BP goal: <130/80  Family History: Heart attack in his brother; Heart disease in his brother  Social History: Denies tobacco and alcohol.   Diet: From home and out. Does not add salt to food. Could improve vegetables intake. Drinks 3-4 cups of coffee per day. Occasional tea/soda. Does drink water regularly.   Exercise: Limited due to knees. Active around the home.   Home BP readings: 150s/high 70s  Wt Readings from Last 3 Encounters:  11/23/17 182 lb 6.4 oz (82.7 kg)  10/31/17 181 lb 12.8 oz (82.5 kg)  05/21/17 181 lb 9.6 oz (82.4 kg)   BP Readings from Last 3 Encounters:  12/13/17 (!) 142/76  11/23/17 (!) 170/78  10/31/17 124/70   Pulse Readings from Last 3 Encounters:  12/13/17 71  11/23/17 70  10/31/17 67    Renal function: CrCl cannot be calculated (Patient's most recent lab result  is older than the maximum 21 days allowed.).  Past Medical History:  Diagnosis Date  . Arthritis   . Coronary artery disease    a. STEMI 03/2016 DESx1 to LCx, DES x 1 Mid LAD, DES x1 prox RCA  . Depression   . Diabetes mellitus without complication (HCC)[place] type 2  . History of acute inferior wall myocardial infarction 04/15/2016   inf-lat/post >> PCI with DES of LCx; staged PCI of LAD and RCA  . Hypercholesteremia   . Hypertension   . Ischemic cardiomyopathy 04/19/2016   A. Inf-lat/post STEMI 7/17 >> b. Echo 04/17/16: Septal and post lateral HK, poor image quality, EF 35-40% // b. Echo 11/17: mild LVH, EF 50-55, inf-lat and ant-lat HK, Gr 1 DD, borderline dilated aortic root (37 mm), MAC  . Leg pain    ABIs 3/19:  normal    Current Outpatient Medications on File Prior to Visit  Medication Sig Dispense Refill  . acetaminophen (TYLENOL) 325 MG tablet Take 2 tablets (650 mg total) by mouth every 4 (four) hours as needed for mild pain (or temp > 37.5 C (99.5 F)). 30 tablet 0  . amLODipine (NORVASC) 10 MG tablet Take 1 tablet (10 mg total) by mouth daily. 30 tablet 0  . atorvastatin (LIPITOR) 80 MG tablet Take 1 tablet (80 mg total) by mouth  daily at 6 PM. 30 tablet 0  . clopidogrel (PLAVIX) 75 MG tablet Take 1 tablet (75 mg total) by mouth daily. 90 tablet 3  . furosemide (LASIX) 20 MG tablet Take 1 tablet (20 mg total) by mouth daily. 90 tablet 3  . glimepiride (AMARYL) 2 MG tablet Take 2 mg by mouth daily with breakfast.     . nitroGLYCERIN (NITROSTAT) 0.4 MG SL tablet Place 1 tablet (0.4 mg total) under the tongue every 5 (five) minutes x 3 doses as needed for chest pain. 25 tablet 3  . ramipril (ALTACE) 5 MG capsule Take 1 capsule (5 mg total) by mouth 2 (two) times daily. 180 capsule 3  . temazepam (RESTORIL) 15 MG capsule Take 15 mg by mouth at bedtime as needed for sleep.     No current facility-administered medications on file prior to visit.     Allergies  Allergen Reactions    . Celecoxib Anaphylaxis    Swelling in throat.  . Sulfa Antibiotics Nausea And Vomiting and Swelling    Blood pressure (!) 142/76, pulse 71.  His cuff - 140/80 75  Assessment/Plan: Hypertension: BP today remains above goal. Will increase carvedilol to 12.83m BID. Follow up in HTN clinic in 3-4 weeks.    Thank you, KLelan Pons APatterson Hammersmith [facility]  12/17/2017 7:37 AM

## 2017-12-13 NOTE — Patient Instructions (Signed)
INCREASE carvedilol to 12.5mg  TWICE daily  (you may take 2 tablets of your current supply TWICE daily until you run out then start higher strength taking 1 tablet (12.5mg ) TWICE daily )

## 2017-12-21 ENCOUNTER — Telehealth: Payer: Self-pay | Admitting: Physician Assistant

## 2017-12-21 MED ORDER — CARVEDILOL 12.5 MG PO TABS: 12.5000 mg | ORAL_TABLET | Freq: Two times a day (BID) | ORAL | 0 refills | Status: DC

## 2017-12-21 NOTE — Telephone Encounter (Signed)
New Message   *STAT* If patient is at the pharmacy, call can be transferred to refill team.  Deacse with [facility] pharmacy states that they fill prescripts mail order and the pt is out now and is requesting a short supply be sent to the [facility]  1. Which medications need to be refilled? (please list name of each medication and dose if known) carvedilol (COREG) 12.5 MG tablet  2. Which pharmacy/location (including street and city if local pharmacy) is medication to be sent to? [place] (N), [place] - [address]  3. Do they need a 30 day or 90 day supply? A short supply

## 2018-01-10 ENCOUNTER — Encounter: Payer: Self-pay | Admitting: Pharmacist

## 2018-01-10 ENCOUNTER — Ambulatory Visit (INDEPENDENT_AMBULATORY_CARE_PROVIDER_SITE_OTHER): Payer: Medicare HMO | Admitting: Pharmacist

## 2018-01-10 VITALS — BP 138/74 | HR 67

## 2018-01-10 DIAGNOSIS — I10 Essential (primary) hypertension: Secondary | ICD-10-CM | POA: Diagnosis not present

## 2018-01-10 MED ORDER — CARVEDILOL 25 MG PO TABS: 25.0000 mg | ORAL_TABLET | Freq: Two times a day (BID) | ORAL | 1 refills | Status: DC

## 2018-01-10 NOTE — Progress Notes (Signed)
Patient ID: Gilbert Obrien                 DOB: 02-Jan-1946                      MRN: [id number]     HPI: Gilbert Obrien is a 72 y.o. male patient of Dr. [name] who presents today for hypertension follow up. PMH significant for coronary artery disease, diabetes, chronic kidney disease, hypertension, hyperlipidemia.  He was admitted in 03/2016 with and inferolateral/posterior STEMI. LHC demonstrated subtotal occlusion of the LCx as well as severe stenosis in the proximal LAD and proximal RCA with overall preserved LVEF. He recently called with BP 210/110 with blurred vision and he was instructed to take extra metoprolol. His metoprolol was then changed to carvedilol and amlodipine was increased.  At his most recent visit his carvedilol was titrated to 12.8m BID.   He presents today for BP follow up.  He states he has been doing well on current regimen. He still had elevated pressures though. He denies chest pain and dizziness. He reports that he does occasionally get short of breath after walking or straining himself, but that this is not new for him. He also reports he rarely has a dull headache. He is not sure if he has had one since our last visit.   Current HTN meds:  Amlodipine 196mdaily in the morning Carvedilol 12.81m63mID Furosemide 35m181mily  Ramipril 81mg 31m  Previously tried: none   BP goal: <130/80  Family History: Heart attack in his brother; Heart disease in his brother  Social History: Denies tobacco and alcohol.   Diet: From home and out. Does not add salt to food. Could improve vegetables intake. Drinks 3-4 cups of coffee per day. Occasional tea/soda. Does drink water regularly.   Exercise: Limited due to knees. Active around the home.   Home BP readings: 157/78 - per his report on home arm cuff  Wt Readings from Last 3 Encounters:  11/23/17 182 lb 6.4 oz (82.7 kg)  10/31/17 181 lb 12.8 oz (82.5 kg)  05/21/17 181 lb 9.6 oz (82.4 kg)   BP Readings from Last 3  Encounters:  01/10/18 138/74  12/13/17 (!) 142/76  11/23/17 (!) 170/78   Pulse Readings from Last 3 Encounters:  01/10/18 67  12/13/17 71  11/23/17 70    Renal function: CrCl cannot be calculated (Patient's most recent lab result is older than the maximum 21 days allowed.).  Past Medical History:  Diagnosis Date  . Arthritis   . Coronary artery disease    a. STEMI 03/2016 DESx1 to LCx, DES x 1 Mid LAD, DES x1 prox RCA  . Depression   . Diabetes mellitus [place] complication (HCC) [place]  . History of acute inferior wall myocardial infarction 04/15/2016   inf-lat/post >> PCI with DES of LCx; staged PCI of LAD and RCA  . Hypercholesteremia   . Hypertension   . Ischemic cardiomyopathy 04/19/2016   A. Inf-lat/post STEMI 7/17 >> b. Echo 04/17/16: Septal and post lateral HK, poor image quality, EF 35-40% // b. Echo 11/17: mild LVH, EF 50-55, inf-lat and ant-lat HK, Gr 1 DD, borderline dilated aortic root (37 mm), MAC  . Leg pain    ABIs 3/19:  normal    Current Outpatient Medications on File Prior to Visit  Medication Sig Dispense Refill  . acetaminophen (TYLENOL) 325 MG tablet Take 2 tablets (650 mg total) by  mouth every 4 (four) hours as needed for mild pain (or temp > 37.5 C (99.5 F)). 30 tablet 0  . amLODipine (NORVASC) 10 MG tablet Take 1 tablet (10 mg total) by mouth daily. 30 tablet 0  . atorvastatin (LIPITOR) 80 MG tablet Take 1 tablet (80 mg total) by mouth daily at 6 PM. 30 tablet 0  . clopidogrel (PLAVIX) 75 MG tablet Take 1 tablet (75 mg total) by mouth daily. 90 tablet 3  . furosemide (LASIX) 20 MG tablet Take 1 tablet (20 mg total) by mouth daily. 90 tablet 3  . glimepiride (AMARYL) 2 MG tablet Take 2 mg by mouth daily with breakfast.     . nitroGLYCERIN (NITROSTAT) 0.4 MG SL tablet Place 1 tablet (0.4 mg total) under the tongue every 5 (five) minutes x 3 doses as needed for chest pain. 25 tablet 3  . ramipril (ALTACE) 5 MG capsule Take 1 capsule (5 mg total) by  mouth 2 (two) times daily. 180 capsule 3  . temazepam (RESTORIL) 15 MG capsule Take 15 mg by mouth at bedtime as needed for sleep.     No current facility-administered medications on file prior to visit.     Allergies  Allergen Reactions  . Celecoxib Anaphylaxis    Swelling in throat.  . Sulfa Antibiotics Nausea And Vomiting and Swelling    Blood pressure 138/74, pulse 67.   Assessment/Plan: Hypertension: BP today borderline at goal today in office though this measurement is much lower than reported home measurements. Will increase carvedilol to 48m BID. Have asked that he bring home cuff with him to follow up appt in 4-6 weeks to verify. Continue all other medications as prescribed.    Thank you, KLelan Pons APatterson Hammersmith [facility]  01/10/2018 1:45 PM

## 2018-01-10 NOTE — Patient Instructions (Addendum)
INCREASE carvedilol to 25mg  TWICE daily (you may use 2 tablets TWICE daily of your current supply - when you run out start taking 1 tablet (25mg ) of the higher strength TWICE daily)   MONITOR your pressures and BRING your blood pressure cuff with you to your next visit.

## 2018-01-20 ENCOUNTER — Other Ambulatory Visit: Payer: Self-pay | Admitting: Cardiovascular Disease

## 2018-01-21 NOTE — Telephone Encounter (Signed)
REFILL 

## 2018-01-21 NOTE — Telephone Encounter (Signed)
°*  STAT* If patient is at the pharmacy, call can be transferred to refill team.   1. Which medications need to be refilled? (please list name of each medication and dose if known)  carvedilol (COREG) 25 MG tablet    (pt want to verify the mg he states that it is 10mg )  2. Which pharmacy/location (including street and city if local pharmacy) is medication to be sent to?  [facility] [place] [phone] (Phone) [phone] (Fax)   3. Do they need a 30 day or 90 day supply? [place]

## 2018-02-19 ENCOUNTER — Other Ambulatory Visit: Payer: Self-pay | Admitting: Physician Assistant

## 2018-02-20 NOTE — Progress Notes (Signed)
Patient ID: Gilbert Obrien                 DOB: 11-06-1945                      MRN: [id number]     HPI: Gilbert Obrien is a 72 y.o. male patient of Dr. [name] who presents today for hypertension follow up. PMH significant for coronary artery disease, diabetes, chronic kidney disease, hypertension, hyperlipidemia.  He was admitted in 03/2016 with and inferolateral/posterior STEMI. LHC demonstrated subtotal occlusion of the LCx as well as severe stenosis in the proximal LAD and proximal RCA with overall preserved LVEF. He recently called with BP 210/110 with blurred vision and he was instructed to take extra metoprolol. His metoprolol was then changed to carvedilol and amlodipine was increased.  At his most recent visit his carvedilol was titrated to 17m BID. He was also advised to bring his cuff to his visit today for verification.   He presents today for BP follow up. He reports that he has had headaches, nausea, and some chest discomfort which is improving. This all started a few weeks ago when he started sertraline. He is not having chest pain today and reports that all of these symptoms are improving. He reports that all of his blood pressure recently have been above 130 but he does not believe that his cuff is correct.   Current HTN meds:  Amlodipine 156mdaily in the morning Carvedilol 2570mID Furosemide 68m64mily  Ramipril 5mg 23m  Previously tried: none   BP goal: <130/80  Family History: Heart attack in his brother; Heart disease in his brother  Social History: Denies tobacco and alcohol.   Diet: From home and out. Does not add salt to food. Could improve vegetables intake. Drinks 3-4 cups of coffee per day. Occasional tea/soda. Does drink water regularly.   Exercise: Limited due to knees. Active around the home.   Home BP readings: >130 on all measurements with home automatic cuff.   Wt Readings from Last 3 Encounters:  11/23/17 182 lb 6.4 oz (82.7 kg)  10/31/17 181  lb 12.8 oz (82.5 kg)  05/21/17 181 lb 9.6 oz (82.4 kg)   BP Readings from Last 3 Encounters:  02/21/18 126/74  01/10/18 138/74  12/13/17 (!) 142/76   Pulse Readings from Last 3 Encounters:  02/21/18 60  01/10/18 67  12/13/17 71    Renal function: CrCl cannot be calculated (Patient's most recent lab result is older than the maximum 21 days allowed.).  Past Medical History:  Diagnosis Date  . Arthritis   . Coronary artery disease    a. STEMI 03/2016 DESx1 to LCx, DES x 1 Mid LAD, DES x1 prox RCA  . Depression   . Diabetes mellitus without complication (HCC) [place]  . History of acute inferior wall myocardial infarction 04/15/2016   inf-lat/post >> PCI with DES of LCx; staged PCI of LAD and RCA  . Hypercholesteremia   . Hypertension   . Ischemic cardiomyopathy 04/19/2016   A. Inf-lat/post STEMI 7/17 >> b. Echo 04/17/16: Septal and post lateral HK, poor image quality, EF 35-40% // b. Echo 11/17: mild LVH, EF 50-55, inf-lat and ant-lat HK, Gr 1 DD, borderline dilated aortic root (37 mm), MAC  . Leg pain    ABIs 3/19:  normal    Current Outpatient Medications on File Prior to Visit  Medication Sig Dispense Refill  . sertraline (ZOLOFT)  50 MG tablet Take 50 mg by mouth daily.    Marland Kitchen acetaminophen (TYLENOL) 325 MG tablet Take 2 tablets (650 mg total) by mouth every 4 (four) hours as needed for mild pain (or temp > 37.5 C (99.5 F)). 30 tablet 0  . amLODipine (NORVASC) 10 MG tablet TAKE 1 TABLET BY MOUTH ONCE DAILY 90 tablet 3  . atorvastatin (LIPITOR) 80 MG tablet Take 1 tablet (80 mg total) by mouth daily at 6 PM. 30 tablet 0  . carvedilol (COREG) 25 MG tablet Take 1 tablet (25 mg total) by mouth 2 (two) times daily. 180 tablet 1  . clopidogrel (PLAVIX) 75 MG tablet Take 1 tablet (75 mg total) by mouth daily. 90 tablet 3  . furosemide (LASIX) 20 MG tablet Take 1 tablet (20 mg total) by mouth daily. 90 tablet 3  . glimepiride (AMARYL) 2 MG tablet Take 2 mg by mouth daily with  breakfast.     . nitroGLYCERIN (NITROSTAT) 0.4 MG SL tablet Place 1 tablet (0.4 mg total) under the tongue every 5 (five) minutes x 3 doses as needed for chest pain. 25 tablet 3  . ramipril (ALTACE) 5 MG capsule Take 1 capsule (5 mg total) by mouth 2 (two) times daily. 180 capsule 3  . temazepam (RESTORIL) 15 MG capsule Take 15 mg by mouth at bedtime as needed for sleep.     No current facility-administered medications on file prior to visit.     Allergies  Allergen Reactions  . Celecoxib Anaphylaxis    Swelling in throat.  . Sulfa Antibiotics Nausea And Vomiting and Swelling    Blood pressure 126/74, pulse 60, SpO2 98 %.   Assessment/Plan: Hypertension:  BP today is at goal. I believe that home cuff is measuring high. Since pressure is at goal will continue current medications without changes. Encouraged him to call or report to ED if chest pain reoccurs. Follow up with Dr. [name] as scheduled sooner if problems and HTN clinic as needed.    Thank you, Lelan Pons. Patterson Hammersmith, [facility]  02/21/2018 2:57 PM

## 2018-02-21 ENCOUNTER — Encounter: Payer: Self-pay | Admitting: Pharmacist

## 2018-02-21 ENCOUNTER — Ambulatory Visit (INDEPENDENT_AMBULATORY_CARE_PROVIDER_SITE_OTHER): Payer: Medicare HMO | Admitting: Pharmacist

## 2018-02-21 ENCOUNTER — Encounter (INDEPENDENT_AMBULATORY_CARE_PROVIDER_SITE_OTHER): Payer: Self-pay

## 2018-02-21 VITALS — BP 126/74 | HR 60

## 2018-02-21 DIAGNOSIS — I10 Essential (primary) hypertension: Secondary | ICD-10-CM | POA: Diagnosis not present

## 2018-02-21 NOTE — Patient Instructions (Signed)
CONTINUE all medications as prescribed.

## 2018-04-17 MED ORDER — AMLODIPINE BESYLATE 10 MG PO TABS: 10.0000 mg | ORAL_TABLET | Freq: Every day | ORAL | 2 refills | Status: DC

## 2018-04-17 NOTE — Addendum Note (Signed)
Addended by: Derl Barrow on: 04/17/2018 01:07 PM   Modules accepted: Orders

## 2018-05-08 DIAGNOSIS — E1122 Type 2 diabetes mellitus with diabetic chronic kidney disease: Secondary | ICD-10-CM | POA: Diagnosis not present

## 2018-05-08 DIAGNOSIS — E782 Mixed hyperlipidemia: Secondary | ICD-10-CM | POA: Diagnosis not present

## 2018-05-15 DIAGNOSIS — E1151 Type 2 diabetes mellitus with diabetic peripheral angiopathy without gangrene: Secondary | ICD-10-CM | POA: Diagnosis not present

## 2018-05-15 DIAGNOSIS — Z125 Encounter for screening for malignant neoplasm of prostate: Secondary | ICD-10-CM | POA: Diagnosis not present

## 2018-05-15 DIAGNOSIS — E782 Mixed hyperlipidemia: Secondary | ICD-10-CM | POA: Diagnosis not present

## 2018-05-22 DIAGNOSIS — Z08 Encounter for follow-up examination after completed treatment for malignant neoplasm: Secondary | ICD-10-CM | POA: Diagnosis not present

## 2018-05-22 DIAGNOSIS — L821 Other seborrheic keratosis: Secondary | ICD-10-CM | POA: Diagnosis not present

## 2018-05-22 DIAGNOSIS — C4442 Squamous cell carcinoma of skin of scalp and neck: Secondary | ICD-10-CM | POA: Diagnosis not present

## 2018-05-22 DIAGNOSIS — D044 Carcinoma in situ of skin of scalp and neck: Secondary | ICD-10-CM | POA: Diagnosis not present

## 2018-05-22 DIAGNOSIS — C4441 Basal cell carcinoma of skin of scalp and neck: Secondary | ICD-10-CM | POA: Diagnosis not present

## 2018-05-22 DIAGNOSIS — Z85828 Personal history of other malignant neoplasm of skin: Secondary | ICD-10-CM | POA: Diagnosis not present

## 2018-05-22 DIAGNOSIS — D045 Carcinoma in situ of skin of trunk: Secondary | ICD-10-CM | POA: Diagnosis not present

## 2018-05-22 DIAGNOSIS — L570 Actinic keratosis: Secondary | ICD-10-CM | POA: Diagnosis not present

## 2018-05-22 DIAGNOSIS — D485 Neoplasm of uncertain behavior of skin: Secondary | ICD-10-CM | POA: Diagnosis not present

## 2018-05-22 DIAGNOSIS — C44622 Squamous cell carcinoma of skin of right upper limb, including shoulder: Secondary | ICD-10-CM | POA: Diagnosis not present

## 2018-05-22 DIAGNOSIS — D0439 Carcinoma in situ of skin of other parts of face: Secondary | ICD-10-CM | POA: Diagnosis not present

## 2018-05-29 ENCOUNTER — Other Ambulatory Visit: Payer: Self-pay | Admitting: Cardiovascular Disease

## 2018-06-06 ENCOUNTER — Encounter: Payer: Self-pay | Admitting: Cardiovascular Disease

## 2018-06-06 ENCOUNTER — Encounter

## 2018-06-06 ENCOUNTER — Ambulatory Visit: Payer: Medicare HMO | Admitting: Cardiovascular Disease

## 2018-06-06 VITALS — BP 128/64 | HR 66 | Ht 67.0 in | Wt 193.8 lb

## 2018-06-06 DIAGNOSIS — N183 Chronic kidney disease, stage 3 unspecified: Secondary | ICD-10-CM

## 2018-06-06 DIAGNOSIS — E782 Mixed hyperlipidemia: Secondary | ICD-10-CM | POA: Diagnosis not present

## 2018-06-06 DIAGNOSIS — I2510 Atherosclerotic heart disease of native coronary artery without angina pectoris: Secondary | ICD-10-CM

## 2018-06-06 DIAGNOSIS — I5032 Chronic diastolic (congestive) heart failure: Secondary | ICD-10-CM

## 2018-06-06 NOTE — Patient Instructions (Addendum)
Medication Instructions:  Your provider recommends that you continue on your current medications as directed. Please refer to the Current Medication list given to you today.    Labwork: None  Testing/Procedures: None  Follow-Up: You have an appointment scheduled with Dr. Antionette Char assistant, Nicki Reaper on Monday, December 02, 2018 at 1:45PM.  Any Other Special Instructions Will Be Listed Below (If Applicable).     If you need a refill on your cardiac medications before your next appointment, please call your pharmacy.

## 2018-06-06 NOTE — Progress Notes (Signed)
Cardiology Office Note:    Date:  06/06/2018   ID:  Gilbert Obrien, DOB 1946-05-12, MRN [id number]  PCP:  [name], MD  Cardiologist:  Sherren Mocha, MD  Electrophysiologist:  None   Referring MD: [name], MD   Chief Complaint  Patient presents with  . Coronary Artery Disease    History of Present Illness:    Gilbert Obrien is a 72 y.o. male with a hx of coronary artery disease with initial presentation in 2017 when he had an inferior posterior STEMI treated with primary PCI to the left circumflex.  He then underwent staged PCI of severe calcific stenoses in the LAD and right coronary arteries.  LV function normalized at follow-up.  Other medical problems include hypertension, type 2 diabetes, history of stroke, and mixed hyperlipidemia.  The patient is here alone today. His primary limitation is related to back and knee pain that he attributes to osteoarthritis. Today, he denies symptoms of palpitations, chest pain, shortness of breath, orthopnea, PND, lower extremity edema, dizziness, or syncope.   Past Medical History:  Diagnosis Date  . Arthritis   . Coronary artery disease    a. STEMI 03/2016 DESx1 to LCx, DES x 1 Mid LAD, DES x1 prox RCA  . Depression   . Diabetes mellitus without complication ([place])    type 2  . History of acute inferior wall myocardial infarction 04/15/2016   inf-lat/post >> PCI with DES of LCx; staged PCI of LAD and RCA  . Hypercholesteremia   . Hypertension   . Ischemic cardiomyopathy 04/19/2016   A. Inf-lat/post STEMI 7/17 >> b. Echo 04/17/16: Septal and post lateral HK, poor image quality, EF 35-40% // b. Echo 11/17: mild LVH, EF 50-55, inf-lat and ant-lat HK, Gr 1 DD, borderline dilated aortic root (37 mm), MAC  . Leg pain    ABIs 3/19:  normal    Past Surgical History:  Procedure Laterality Date  . CARDIAC CATHETERIZATION N/A 04/15/2016   Procedure: Left Heart Cath and Coronary Angiography;  Surgeon: Sherren Mocha, MD;   Location: [facility];  Service: Cardiovascular;  Laterality: N/A;  . CARDIAC CATHETERIZATION N/A 04/15/2016   Procedure: Coronary Stent Intervention;  Surgeon: Sherren Mocha, MD;  Location: [facility];  Service: Cardiovascular;  Laterality: N/A;  . CARDIAC CATHETERIZATION N/A 04/17/2016   Procedure: Coronary Stent Intervention;  Surgeon: Burnell Blanks, MD;  Location: [place] [facility];  Service: Cardiovascular;  Laterality: N/A;  . CARDIAC CATHETERIZATION N/A 04/18/2016   Procedure: Coronary Stent Intervention;  Surgeon: Burnell Blanks, MD;  Location: [place] [facility];  Service: Cardiovascular;  Laterality: N/A;  . CARDIAC CATHETERIZATION N/A 04/18/2016   Procedure: Temporary Pacemaker;  Surgeon: Burnell Blanks, MD;  Location: [facility];  Service: Cardiovascular;  Laterality: N/A;  . CORONARY STENT PLACEMENT  04/17/2016    Severe stenosis proximal LAD, now s/p successful PTCA/DES x 1 proximal and mid LAD  . COSMETIC SURGERY  1974   right side facial       . SHOULDER SURGERY      Current Medications: Current Meds  Medication Sig  . acetaminophen (TYLENOL) 325 MG tablet Take 2 tablets (650 mg total) by mouth every 4 (four) hours as needed for mild pain (or temp > 37.5 C (99.5 F)).  Marland Kitchen amLODipine (NORVASC) 10 MG tablet Take 1 tablet (10 mg total) by mouth daily.  Marland Kitchen atorvastatin (LIPITOR) 80 MG tablet Take 1 tablet (80 mg  total) by mouth daily at 6 PM.  . carvedilol (COREG) 25 MG tablet TAKE 1 TABLET (25 MG TOTAL) BY MOUTH 2 (TWO) TIMES DAILY.  Marland Kitchen clopidogrel (PLAVIX) 75 MG tablet Take 1 tablet (75 mg total) by mouth daily.  . furosemide (LASIX) 20 MG tablet Take 1 tablet (20 mg total) by mouth daily.  Marland Kitchen glimepiride (AMARYL) 2 MG tablet Take 2 mg by mouth daily with breakfast.   . nitroGLYCERIN (NITROSTAT) 0.4 MG SL tablet Place 1 tablet (0.4 mg total) under the tongue every 5 (five) minutes x 3 doses as needed for chest pain.  . ramipril  (ALTACE) 5 MG capsule Take 1 capsule (5 mg total) by mouth 2 (two) times daily.  . sertraline (ZOLOFT) 50 MG tablet Take 50 mg by mouth daily.  . temazepam (RESTORIL) 15 MG capsule Take 15 mg by mouth at bedtime as needed for sleep.     Allergies:   Celecoxib and Sulfa antibiotics   Social History   Socioeconomic History  . Marital status: Married    Spouse name: Not on file  . Number of children: Not on file  . Years of education: Not on file  . Highest education level: Not on file  Occupational History  . Not on file  Social Needs  . Financial resource strain: Not on file  . Food insecurity:    Worry: Not on file    Inability: Not on file  . Transportation needs:    Medical: Not on file    Non-medical: Not on file  Tobacco Use  . Smoking status: Former Smoker    Last attempt to quit: 09/25/2000    Years since quitting: 17.7  . Smokeless tobacco: Never Used  Substance and Sexual Activity  . Alcohol use: No  . Drug use: No  . Sexual activity: Not on file  Lifestyle  . Physical activity:    Days per week: Not on file    Minutes per session: Not on file  . Stress: Not on file  Relationships  . Social connections:    Talks on phone: Not on file    Gets together: Not on file    Attends religious service: Not on file    Active member of club or organization: Not on file    Attends meetings of clubs or organizations: Not on file    Relationship status: Not on file  Other Topics Concern  . Not on file  Social History Narrative  . Not on file     Family History: The patient's family history includes Heart attack in his brother; Heart disease in his brother.  ROS:   Please see the history of present illness.    Positive for weight gain, decreased appetite, exertional dyspnea, excessive sweating, excessive fatigue, difficulty urinating.  All other systems reviewed and are negative.  EKGs/Labs/Other Studies Reviewed:    The following studies were reviewed today: Echo  10-04-2016: Study Conclusions  - Left ventricle: The cavity size was normal. Systolic function was   normal. The estimated ejection fraction was in the range of 50%   to 55%. Diffuse hypokinesis. Doppler parameters are consistent   with abnormal left ventricular relaxation (grade 1 diastolic   dysfunction). - Mitral valve: Calcified annulus.  Impressions:  - No cardiac source of emboli was indentified. Compared to the   prior study, there has been no significant interval change.  EKG:  EKG is not ordered today.    Recent Labs: 10/31/2017: ALT 17 11/23/2017: BUN  12; Creatinine, Ser 1.28; Potassium 4.1; Sodium 143  Recent Lipid Panel    Component Value Date/Time   CHOL 107 10/31/2017 1053   TRIG 85 10/31/2017 1053   HDL 33 (L) 10/31/2017 1053   CHOLHDL 3.2 10/31/2017 1053   CHOLHDL 3.7 10/04/2016 0634   VLDL 21 10/04/2016 0634   LDLCALC 57 10/31/2017 1053    Physical Exam:    VS:  BP 128/64   Pulse 66   Ht 5' 7"  (1.702 m)   Wt 193 lb 12.8 oz (87.9 kg)   SpO2 93%   BMI 30.35 kg/m     Wt Readings from Last 3 Encounters:  06/06/18 193 lb 12.8 oz (87.9 kg)  11/23/17 182 lb 6.4 oz (82.7 kg)  10/31/17 181 lb 12.8 oz (82.5 kg)     GEN:  Well nourished, well developed in no acute distress HEENT: Normal NECK: No JVD; No carotid bruits LYMPHATICS: No lymphadenopathy CARDIAC: RRR, no murmurs, rubs, gallops RESPIRATORY:  Clear to auscultation without rales, wheezing or rhonchi  ABDOMEN: Soft, non-tender, non-distended MUSCULOSKELETAL: Trace bilateral pretibial edema; No deformity  SKIN: Warm and dry NEUROLOGIC:  Alert and oriented x 3 PSYCHIATRIC:  Normal affect   ASSESSMENT:    1. CKD (chronic kidney disease) stage 3, GFR 30-59 ml/min (HCC)   2. Chronic diastolic CHF (congestive heart failure) ([place])   3. Coronary artery disease involving native coronary artery of native heart without angina pectoris   4. Mixed hyperlipidemia    PLAN:    In order of problems  listed above:  1. Most recent labs reviewed and creatinine is stable at 1.28, down from previous level of 1.44 mg/dL.  The patient is tolerating a low-dose ACE inhibitor with ramipril 5 mg daily. 2. Volume status looks good and he appears euvolemic.  Blood pressure is well controlled.  We will continue current medicines. 3. The patient is having no anginal symptoms, on clopidogrel as antiplatelet monotherapy after his stroke.  He is treated with a beta-blocker and high intensity statin drug. 4. Most recent lipids are reviewed on atorvastatin 80 mg and they look excellent with an LDL cholesterol of 57 mg/dL.   Medication Adjustments/Labs and Tests Ordered: Current medicines are reviewed at length with the patient today.  Concerns regarding medicines are outlined above.  No orders of the defined types were placed in this encounter.  No orders of the defined types were placed in this encounter.   Patient Instructions  Medication Instructions:  Your provider recommends that you continue on your current medications as directed. Please refer to the Current Medication list given to you today.    Labwork: None  Testing/Procedures: None  Follow-Up: You have an appointment scheduled with Dr. Antionette Char assistant, Nicki Reaper on Monday, December 02, 2018 at 1:45PM.  Any Other Special Instructions Will Be Listed Below (If Applicable).     If you need a refill on your cardiac medications before your next appointment, please call your pharmacy.      Signed, Sherren Mocha, MD  06/06/2018 5:38 PM    [place]

## 2018-07-03 DIAGNOSIS — D044 Carcinoma in situ of skin of scalp and neck: Secondary | ICD-10-CM | POA: Diagnosis not present

## 2018-07-03 DIAGNOSIS — C4442 Squamous cell carcinoma of skin of scalp and neck: Secondary | ICD-10-CM | POA: Diagnosis not present

## 2018-07-17 DIAGNOSIS — C4441 Basal cell carcinoma of skin of scalp and neck: Secondary | ICD-10-CM | POA: Diagnosis not present

## 2018-07-17 DIAGNOSIS — Z85828 Personal history of other malignant neoplasm of skin: Secondary | ICD-10-CM | POA: Diagnosis not present

## 2018-07-24 DIAGNOSIS — D045 Carcinoma in situ of skin of trunk: Secondary | ICD-10-CM | POA: Diagnosis not present

## 2018-07-24 DIAGNOSIS — C44622 Squamous cell carcinoma of skin of right upper limb, including shoulder: Secondary | ICD-10-CM | POA: Diagnosis not present

## 2018-07-24 DIAGNOSIS — D044 Carcinoma in situ of skin of scalp and neck: Secondary | ICD-10-CM | POA: Diagnosis not present

## 2018-08-07 DIAGNOSIS — D0439 Carcinoma in situ of skin of other parts of face: Secondary | ICD-10-CM | POA: Diagnosis not present

## 2018-08-14 ENCOUNTER — Other Ambulatory Visit: Payer: Self-pay | Admitting: Cardiology

## 2018-09-23 ENCOUNTER — Other Ambulatory Visit: Payer: Self-pay | Admitting: Cardiology

## 2018-10-16 ENCOUNTER — Other Ambulatory Visit: Payer: Self-pay | Admitting: Cardiovascular Disease

## 2018-11-11 DIAGNOSIS — E782 Mixed hyperlipidemia: Secondary | ICD-10-CM | POA: Diagnosis not present

## 2018-11-11 DIAGNOSIS — Z125 Encounter for screening for malignant neoplasm of prostate: Secondary | ICD-10-CM | POA: Diagnosis not present

## 2018-11-11 DIAGNOSIS — E1151 Type 2 diabetes mellitus with diabetic peripheral angiopathy without gangrene: Secondary | ICD-10-CM | POA: Diagnosis not present

## 2018-11-18 ENCOUNTER — Other Ambulatory Visit: Payer: Self-pay | Admitting: Cardiovascular Disease

## 2018-11-18 DIAGNOSIS — N183 Chronic kidney disease, stage 3 (moderate): Secondary | ICD-10-CM | POA: Diagnosis not present

## 2018-11-18 DIAGNOSIS — E1151 Type 2 diabetes mellitus with diabetic peripheral angiopathy without gangrene: Secondary | ICD-10-CM | POA: Diagnosis not present

## 2018-11-18 DIAGNOSIS — M170 Bilateral primary osteoarthritis of knee: Secondary | ICD-10-CM | POA: Diagnosis not present

## 2018-11-18 DIAGNOSIS — Z0000 Encounter for general adult medical examination without abnormal findings: Secondary | ICD-10-CM | POA: Diagnosis not present

## 2018-11-18 DIAGNOSIS — I5023 Acute on chronic systolic (congestive) heart failure: Secondary | ICD-10-CM | POA: Diagnosis not present

## 2018-12-02 ENCOUNTER — Ambulatory Visit: Payer: Medicare HMO | Admitting: Physician Assistant

## 2018-12-02 DIAGNOSIS — N183 Chronic kidney disease, stage 3 (moderate): Secondary | ICD-10-CM | POA: Diagnosis not present

## 2018-12-02 DIAGNOSIS — M170 Bilateral primary osteoarthritis of knee: Secondary | ICD-10-CM | POA: Diagnosis not present

## 2018-12-02 DIAGNOSIS — E1151 Type 2 diabetes mellitus with diabetic peripheral angiopathy without gangrene: Secondary | ICD-10-CM | POA: Diagnosis not present

## 2018-12-03 ENCOUNTER — Encounter: Payer: Self-pay | Admitting: Physician Assistant

## 2018-12-03 ENCOUNTER — Encounter (INDEPENDENT_AMBULATORY_CARE_PROVIDER_SITE_OTHER): Payer: Self-pay

## 2018-12-03 ENCOUNTER — Ambulatory Visit: Payer: Medicare HMO | Admitting: Physician Assistant

## 2018-12-03 VITALS — BP 156/84 | HR 77 | Ht 67.0 in | Wt 182.0 lb

## 2018-12-03 DIAGNOSIS — N183 Chronic kidney disease, stage 3 unspecified: Secondary | ICD-10-CM

## 2018-12-03 DIAGNOSIS — I10 Essential (primary) hypertension: Secondary | ICD-10-CM | POA: Diagnosis not present

## 2018-12-03 DIAGNOSIS — I5032 Chronic diastolic (congestive) heart failure: Secondary | ICD-10-CM

## 2018-12-03 DIAGNOSIS — I2510 Atherosclerotic heart disease of native coronary artery without angina pectoris: Secondary | ICD-10-CM

## 2018-12-03 DIAGNOSIS — E782 Mixed hyperlipidemia: Secondary | ICD-10-CM | POA: Diagnosis not present

## 2018-12-03 NOTE — Patient Instructions (Signed)
Medication Instructions:  No changes - see your medication list.  If you need a refill on your cardiac medications before your next appointment, please call your pharmacy.   Lab work: None   If you have labs (blood work) drawn today and your tests are completely normal, you will receive your results only by: Marland Kitchen MyChart Message (if you have MyChart) OR . A paper copy in the mail If you have any lab test that is abnormal or we need to change your treatment, we will call you to review the results.  Testing/Procedures: None   Follow-Up: At [facility], you and your health needs are our priority.  As part of our continuing mission to provide you with exceptional heart care, we have created designated Provider Care Teams.  These Care Teams include your primary Cardiologist (physician) and Advanced Practice Providers (APPs -  Physician Assistants and Nurse Practitioners) who all work together to provide you with the care you need, when you need it. You will need a follow up appointment in:  6 months.  Please call our office 2 months in advance to schedule this appointment.  You may see Sherren Mocha, MD or Richardson Dopp, PA-C  Any Other Special Instructions Will Be Listed Below (If Applicable).  Try to check your blood pressure several times over the next several weeks.  If blood pressure is 140/90 or higher, call so we can adjust your medication.

## 2018-12-03 NOTE — Progress Notes (Signed)
Cardiology Office Note:    Date:  12/03/2018   ID:  Gilbert Obrien, DOB 1946-03-26, MRN [id number]  PCP:  [name], MD  Cardiologist:  Sherren Mocha, MD  Electrophysiologist:  None   Referring MD: [name], MD   Chief Complaint  Patient presents with  . Follow-up    CAD, CHF    History of Present Illness:    Gilbert Obrien is a 73 y.o. 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  He presented in July 2017 with inferolateral/posterior ST elevation MI.  He underwent multivessel PCI with DES to the LCx and staged intervention with DES to the LAD orbital atherectomy and DES to the proximal RCA.  EF was 35-40%.  However, his ejection fraction improved to normal on follow-up studies.  He was last seen by Dr. [name] in September 2019.  [name] returns for follow-up.  He is here alone.  He has not had any chest discomfort, significant change in his chronic shortness of breath, orthopnea, PND or significant lower extremity swelling.  He has mainly been bothered by joint pain.  His primary care provider has placed him on NSAID therapy with improved symptoms.  Prior CV studies:   The following studies were reviewed today:  Carotid US 10/04/16 Bilat ICA 1-39  Echo 10/04/16 EF 50-55, diff HK, Gr 1 DD, MAC  Echo 08/15/16 EF 60-65, no RWMA, MAC, mild MR  Echo 07/27/16 Mild LVH, EF 50-55, inf-lat and ant-lat HK, Gr 1 DD, Ao root 37, MAC  LHC 04/18/16 LAD stent ok LCx stent ok RCA prox 90% PCI: Orbital atherectomy and 4 x 24 mm Synergy DES to RCA 1. Severe heavily calcified stenosis in the proximal to mid RCA.  2. Successful orbital atherectomy of the proximal RCA stenosis.  3. Successful PTCA/DES x 1 proximal RCA stenosis.   LHC 04/17/16 PCI: 3.5 x 38 mm Synergy DES to proximal and mid LAD  Echo 04/17/16 Septal and post lateral  HK, poor image quality, EF 35-40%  UE Arterial US 04/16/16 The right subclavian, axillary, brachial, radial, ulnar, and palmar arch arteries were visualized and found to be patent with triphasic flow.  LHC 04/15/16 LAD prox 50%, mid 90% LCx mid 99% RCA prox 90% PCI: 3.5 x 20 mm Synergy DES to LCx  Past Medical History:  Diagnosis Date  . Arthritis   . Coronary artery disease    a. STEMI 03/2016 DESx1 to LCx, DES x 1 Mid LAD, DES x1 prox RCA  . Depression   . Diabetes mellitus without complication ([place])    type 2  . History of acute inferior wall myocardial infarction 04/15/2016   inf-lat/post >> PCI with DES of LCx; staged PCI of LAD and RCA  . Hypercholesteremia   . Hypertension   . Ischemic cardiomyopathy 04/19/2016   A. Inf-lat/post STEMI 7/17 >> b. Echo 04/17/16: Septal and post lateral HK, poor image quality, EF 35-40% // b. Echo 11/17: mild LVH, EF 50-55, inf-lat and ant-lat HK, Gr 1 DD, borderline dilated aortic root (37 mm), MAC  . Leg pain    ABIs 3/19:  normal   Surgical Hx: The patient  has a past surgical history that includes Shoulder surgery; Cardiac catheterization (N/A, 04/15/2016); Cardiac catheterization (N/A, 04/15/2016); Cardiac catheterization (N/A, 04/17/2016); Coronary stent placement (04/17/2016); Cosmetic surgery (1974); Cardiac catheterization (N/A, 04/18/2016); and Cardiac catheterization (N/A, 04/18/2016).   Current Medications: Current  Meds  Medication Sig  . acetaminophen (TYLENOL) 325 MG tablet Take 2 tablets (650 mg total) by mouth every 4 (four) hours as needed for mild pain (or temp > 37.5 C (99.5 F)).  Marland Kitchen amLODipine (NORVASC) 10 MG tablet Take 1 tablet (10 mg total) by mouth daily. Please keep upcoming appt for future refills - thank you  . atorvastatin (LIPITOR) 80 MG tablet Take 1 tablet (80 mg total) by mouth daily at 6 PM.  . carvedilol (COREG) 25 MG tablet TAKE 1 TABLET TWICE DAILY  . clopidogrel (PLAVIX) 75 MG tablet TAKE 1 TABLET DAILY.  Marland Kitchen  etodolac (LODINE) 400 MG tablet Take 400 mg by mouth daily with breakfast.  . furosemide (LASIX) 20 MG tablet TAKE 1 TABLET EVERY DAY  . glimepiride (AMARYL) 2 MG tablet Take 2 mg by mouth daily with breakfast.   . nitroGLYCERIN (NITROSTAT) 0.4 MG SL tablet Place 1 tablet (0.4 mg total) under the tongue every 5 (five) minutes x 3 doses as needed for chest pain.  . ramipril (ALTACE) 5 MG capsule Take 1 capsule (5 mg total) by mouth 2 (two) times daily.  . sertraline (ZOLOFT) 50 MG tablet Take 50 mg by mouth daily.  . temazepam (RESTORIL) 15 MG capsule Take 15 mg by mouth at bedtime as needed for sleep.     Allergies:   Celecoxib and Sulfa antibiotics   Social History   Tobacco Use  . Smoking status: Former Smoker    Last attempt to quit: 09/25/2000    Years since quitting: 18.2  . Smokeless tobacco: Never Used  Substance Use Topics  . Alcohol use: No  . Drug use: No     Family Hx: The patient's family history includes Heart attack in his brother; Heart disease in his brother.  ROS:   Please see the history of present illness.    Review of Systems  Constitution: Positive for chills and decreased appetite.  Eyes: Positive for visual disturbance.  Musculoskeletal: Positive for joint swelling.  Gastrointestinal: Positive for diarrhea.  Neurological: Positive for loss of balance.   All other systems reviewed and are negative.   EKGs/Labs/Other Test Reviewed:    EKG:  EKG is  ordered today.  The ekg ordered today demonstrates normal sinus rhythm, heart rate 61, normal axis, QTC 406, no acute changes  Recent Labs:   From CareEverywhere      Recent Lipid Panel     Physical Exam:    VS:  BP (!) 156/84   Pulse 77   Ht _0  (1.702 m)   Wt 182 lb (82.6 kg)   SpO2 99%   BMI 28.51 kg/m     Wt Readings from Last 3 Encounters:  12/03/18 182 lb (82.6 kg)  06/06/18 193 lb 12.8 oz (87.9 kg)  11/23/17 182 lb 6.4 oz (82.7 kg)     Physical Exam  Constitutional: He is  oriented to person, place, and time. He appears well-developed and well-nourished. No distress.  HENT:  Head: Normocephalic and atraumatic.  Eyes: No scleral icterus.  Neck: Neck supple. No JVD present. Carotid bruit is not present. No thyromegaly present.  Cardiovascular: Normal rate, regular rhythm, S1 normal and S2 normal.  No murmur heard. Pulmonary/Chest: Breath sounds normal. He has no rales.  Abdominal: Soft. There is no hepatomegaly.  Musculoskeletal:        General: No edema.  Lymphadenopathy:    He has no cervical adenopathy.  Neurological: He is alert and oriented to person, place,  and time.  Skin: Skin is warm and dry.  Psychiatric: He has a normal mood and affect.    ASSESSMENT & PLAN:    Coronary artery disease involving native coronary artery of native heart without angina pectoris History of inferolateral ST elevation myocardial infarction in 2017 treated with multivessel stenting.  He is doing well without anginal symptoms.  Continue clopidogrel, atorvastatin, carvedilol, Ramipril.  Chronic diastolic CHF (congestive heart failure) (HCC) EF 50-55 with mild diastolic dysfunction by echocardiogram in 2018.  NYHA 2.  Volume status appears stable.  Continue current therapy.  CKD (chronic kidney disease) stage 3, GFR 30-59 ml/min (HCC) Recent creatinine stable.  Essential hypertension Blood pressure above target today.  His blood pressures are usually optimal.  Question if the addition of etodolac may be contributing to elevated blood pressures.  I have asked him to monitor his blood pressure over the next several weeks and notify me if his readings are 140/90 or higher  Mixed hyperlipidemia LDL optimal on most recent lab work.  Continue current Rx.     Dispo:  Return in about 6 months (around 06/05/2019) for Routine Follow Up, w/ Dr. [name], or Richardson Dopp, PA-C.   Medication Adjustments/Labs and Tests Ordered: Current medicines are reviewed at length with the  patient today.  Concerns regarding medicines are outlined above.  Tests Ordered: Orders Placed This Encounter  Procedures  . EKG 12-Lead   Medication Changes: No orders of the defined types were placed in this encounter.   Signed, Richardson Dopp, PA-C  12/03/2018 6:00 PM    [facility] [place], [place], [place]  [address] Phone: [phone]; Fax: [phone]

## 2018-12-09 ENCOUNTER — Other Ambulatory Visit: Payer: Self-pay | Admitting: Physician Assistant

## 2019-03-05 ENCOUNTER — Other Ambulatory Visit: Payer: Self-pay | Admitting: Cardiology

## 2019-03-24 ENCOUNTER — Other Ambulatory Visit: Payer: Self-pay | Admitting: Cardiovascular Disease

## 2019-05-09 ENCOUNTER — Other Ambulatory Visit: Payer: Self-pay | Admitting: Cardiovascular Disease

## 2019-05-12 DIAGNOSIS — E1151 Type 2 diabetes mellitus with diabetic peripheral angiopathy without gangrene: Secondary | ICD-10-CM | POA: Diagnosis not present

## 2019-05-26 DIAGNOSIS — E1151 Type 2 diabetes mellitus with diabetic peripheral angiopathy without gangrene: Secondary | ICD-10-CM | POA: Diagnosis not present

## 2019-05-26 DIAGNOSIS — H6122 Impacted cerumen, left ear: Secondary | ICD-10-CM | POA: Diagnosis not present

## 2019-05-26 DIAGNOSIS — N183 Chronic kidney disease, stage 3 (moderate): Secondary | ICD-10-CM | POA: Diagnosis not present

## 2019-05-26 DIAGNOSIS — Z125 Encounter for screening for malignant neoplasm of prostate: Secondary | ICD-10-CM | POA: Diagnosis not present

## 2019-05-26 DIAGNOSIS — B078 Other viral warts: Secondary | ICD-10-CM | POA: Diagnosis not present

## 2019-05-26 DIAGNOSIS — E1165 Type 2 diabetes mellitus with hyperglycemia: Secondary | ICD-10-CM | POA: Diagnosis not present

## 2019-05-26 DIAGNOSIS — I5023 Acute on chronic systolic (congestive) heart failure: Secondary | ICD-10-CM | POA: Diagnosis not present

## 2019-06-18 ENCOUNTER — Ambulatory Visit: Payer: Medicare HMO | Admitting: Physician Assistant

## 2019-06-18 ENCOUNTER — Encounter: Payer: Self-pay | Admitting: Physician Assistant

## 2019-06-18 ENCOUNTER — Other Ambulatory Visit: Payer: Self-pay

## 2019-06-18 VITALS — BP 134/72 | HR 56 | Ht 67.0 in | Wt 188.8 lb

## 2019-06-18 DIAGNOSIS — I2510 Atherosclerotic heart disease of native coronary artery without angina pectoris: Secondary | ICD-10-CM

## 2019-06-18 DIAGNOSIS — E1159 Type 2 diabetes mellitus with other circulatory complications: Secondary | ICD-10-CM

## 2019-06-18 DIAGNOSIS — N183 Chronic kidney disease, stage 3 unspecified: Secondary | ICD-10-CM

## 2019-06-18 DIAGNOSIS — I5032 Chronic diastolic (congestive) heart failure: Secondary | ICD-10-CM

## 2019-06-18 DIAGNOSIS — E782 Mixed hyperlipidemia: Secondary | ICD-10-CM

## 2019-06-18 DIAGNOSIS — I10 Essential (primary) hypertension: Secondary | ICD-10-CM

## 2019-06-18 NOTE — Patient Instructions (Signed)
Medication Instructions:  none If you need a refill on your cardiac medications before your next appointment, please call your pharmacy.   Lab work: none If you have labs (blood work) drawn today and your tests are completely normal, you will receive your results only by: . MyChart Message (if you have MyChart) OR . A paper copy in the mail If you have any lab test that is abnormal or we need to change your treatment, we will call you to review the results.  Testing/Procedures: none  Follow-Up: At [facility], you and your health needs are our priority.  As part of our continuing mission to provide you with exceptional heart care, we have created designated Provider Care Teams.  These Care Teams include your primary Cardiologist (physician) and Advanced Practice Providers (APPs -  Physician Assistants and Nurse Practitioners) who all work together to provide you with the care you need, when you need it. You will need a follow up appointment in:  6 months.  Please call our office 2 months in advance to schedule this appointment.  You may see Antone Summons Cooper, MD or one of the following Advanced Practice Providers on your designated Care Team: Scott Weaver, PA-C Vin Bhagat, PA-C . Janine Hammond, NP  Any Other Special Instructions Will Be Listed Below (If Applicable).    

## 2019-06-18 NOTE — Progress Notes (Signed)
Cardiology Office Note:    Date:  06/18/2019   ID:  Gilbert Obrien, DOB 1945-12-18, MRN [id number]  PCP:  [name], MD  Cardiologist:  Sherren Mocha, MD  Electrophysiologist:  None   Referring MD: [name], MD   Chief Complaint  Patient presents with  . Follow-up    CAD    History of Present Illness:    Gilbert Obrien is a 73 y.o. male with:  Coronary artery disease  S/p inferolateral/posterior STEMI 7/17 >> multivessel PCI   S/p DES to the LCx, staged DES to the LAD and orbital atherectomy +DES to [facility]  Ischemic cardiomyopathy  EF 35-40 >> improved to normal (Echo 1/18: EF 50-55)  Chronic diastolic CHF  Diabetes mellitus  Chronic kidney disease  Hypertension  Hyperlipidemia  Left brain CVA 09/2016  Gilbert Obrien was last seen in March 2020.  He returns for follow-up.  He is here alone.  Since last seen he has not had chest discomfort, syncope, orthopnea, leg swelling.  He wears compression stockings.  He has chronic shortness of breath with exertion.  This is unchanged.  He has not had any bleeding issues.   Prior CV studies:   The following studies were reviewed today:  Carotid US 10/04/16 Bilat ICA 1-39  Echo 10/04/16 EF 50-55, diff HK, Gr 1 DD, MAC  Echo 08/15/16 EF 60-65, no RWMA, MAC, mild MR  Echo 07/27/16 Mild LVH, EF 50-55, inf-lat and ant-lat HK, Gr 1 DD, Ao root 37, MAC  LHC 04/18/16 LAD stent ok LCx stent ok RCA prox 90% PCI: Orbital atherectomy and 4 x 24 mm Synergy DES to RCA 1. Severe heavily calcified stenosis in the proximal to mid RCA.  2. Successful orbital atherectomy of the proximal RCA stenosis.  3. Successful PTCA/DES x 1 proximal RCA stenosis.   LHC 04/17/16 PCI: 3.5 x 38 mm Synergy DES to proximal and mid LAD  Echo 04/17/16 Septal and post lateral HK, poor image quality, EF 35-40%  UE Arterial US 04/16/16 The right subclavian, axillary, brachial, radial, ulnar, and palmar arch arteries were  visualized and found to be patent with triphasic flow.  LHC 04/15/16 LAD prox 50%, mid 90% LCx mid 99% RCA prox 90% PCI: 3.5 x 20 mm Synergy DES to LCx  Past Medical History:  Diagnosis Date  . Arthritis   . Coronary artery disease    a. STEMI 03/2016 DESx1 to LCx, DES x 1 Mid LAD, DES x1 prox RCA  . Depression   . Diabetes mellitus without complication ([place])    type 2  . History of acute inferior wall myocardial infarction 04/15/2016   inf-lat/post >> PCI with DES of LCx; staged PCI of LAD and RCA  . Hypercholesteremia   . Hypertension   . Ischemic cardiomyopathy 04/19/2016   A. Inf-lat/post STEMI 7/17 >> b. Echo 04/17/16: Septal and post lateral HK, poor image quality, EF 35-40% // b. Echo 11/17: mild LVH, EF 50-55, inf-lat and ant-lat HK, Gr 1 DD, borderline dilated aortic root (37 mm), MAC  . Leg pain    ABIs 3/19:  normal   Surgical Hx: The patient  has a past surgical history that includes Shoulder surgery; Cardiac catheterization (N/A, 04/15/2016); Cardiac catheterization (N/A, 04/15/2016); Cardiac catheterization (N/A, 04/17/2016); Coronary stent placement (04/17/2016); Cosmetic surgery (1974); Cardiac catheterization (N/A, 04/18/2016); and Cardiac catheterization (N/A, 04/18/2016).   Current Medications: Current Meds  Medication Sig  . acetaminophen (TYLENOL) 325 MG tablet Take 2 tablets (650 mg  total) by mouth every 4 (four) hours as needed for mild pain (or temp > 37.5 C (99.5 F)).  Marland Kitchen amLODipine (NORVASC) 10 MG tablet Take 1 tablet (10 mg total) by mouth daily.  Marland Kitchen atorvastatin (LIPITOR) 80 MG tablet Take 1 tablet (80 mg total) by mouth daily at 6 PM.  . carvedilol (COREG) 25 MG tablet TAKE 1 TABLET TWICE DAILY  . clopidogrel (PLAVIX) 75 MG tablet Take 1 tablet (75 mg total) by mouth daily.  Marland Kitchen etodolac (LODINE) 400 MG tablet Take 400 mg by mouth daily with breakfast.  . furosemide (LASIX) 20 MG tablet TAKE 1 TABLET EVERY DAY  . glimepiride (AMARYL) 2 MG tablet Take 2 mg by  mouth daily with breakfast.   . nitroGLYCERIN (NITROSTAT) 0.4 MG SL tablet Place 1 tablet (0.4 mg total) under the tongue every 5 (five) minutes x 3 doses as needed for chest pain.  . ramipril (ALTACE) 5 MG capsule TAKE 1 CAPSULE TWICE DAILY  . sertraline (ZOLOFT) 50 MG tablet Take 50 mg by mouth daily.  . temazepam (RESTORIL) 15 MG capsule Take 15 mg by mouth at bedtime as needed for sleep.     Allergies:   Celecoxib and Sulfa antibiotics   Social History   Tobacco Use  . Smoking status: Former Smoker    Quit date: 09/25/2000    Years since quitting: 18.7  . Smokeless tobacco: Never Used  Substance Use Topics  . Alcohol use: No  . Drug use: No     Family Hx: The patient's family history includes Heart attack in his brother; Heart disease in his brother.  ROS:   Please see the history of present illness.    Review of Systems  Musculoskeletal: Positive for joint pain.  Gastrointestinal: Negative for hematochezia.  Genitourinary: Negative for hematuria.   All other systems reviewed and are negative.   EKGs/Labs/Other Test Reviewed:    EKG:  EKG is  ordered today.  The ekg ordered today demonstrates sinus bradycardia, heart rate 59, inferior Q waves, no ST-T wave changes, QTC 426, similar to prior tracing  Recent Labs: No results found for requested labs within last 8760 hours.   Recent Lipid Panel Lab Results  Component Value Date/Time   CHOL 107 10/31/2017 10:53 AM   TRIG 85 10/31/2017 10:53 AM   HDL 33 (L) 10/31/2017 10:53 AM   CHOLHDL 3.2 10/31/2017 10:53 AM   CHOLHDL 3.7 10/04/2016 06:34 AM   LDLCALC 57 10/31/2017 10:53 AM    Physical Exam:    VS:  BP 134/72   Pulse (!) 56   Ht _0  (1.702 m)   Wt 188 lb 12.8 oz (85.6 kg)   SpO2 99%   BMI 29.57 kg/m     Wt Readings from Last 3 Encounters:  06/18/19 188 lb 12.8 oz (85.6 kg)  12/03/18 182 lb (82.6 kg)  06/06/18 193 lb 12.8 oz (87.9 kg)     Physical Exam  Constitutional: He is oriented to person,  place, and time. He appears well-developed and well-nourished. No distress.  HENT:  Head: Normocephalic and atraumatic.  Eyes: No scleral icterus.  Neck: No JVD present. No thyromegaly present.  Cardiovascular: Normal rate, regular rhythm and normal heart sounds.  No murmur heard. Pulmonary/Chest: Effort normal and breath sounds normal. He has no rales.  Abdominal: Soft. There is no hepatomegaly.  Musculoskeletal:        General: Edema (trace bilat LE edema) present.  Lymphadenopathy:    He has no cervical  adenopathy.  Neurological: He is alert and oriented to person, place, and time.  Skin: Skin is warm and dry.  Psychiatric: He has a normal mood and affect.    ASSESSMENT & PLAN:    1. Coronary artery disease involving native coronary artery of native heart without angina pectoris History of inferolateral ST elevation myocardial infarction in 2017 treated with multivessel stenting.  He is doing well without anginal symptoms.  Continue clopidogrel, beta-blocker, statin, ACE inhibitor.  Follow-up with Dr. [name] in 6 months.  2. Chronic diastolic CHF (congestive heart failure) ([place]) EF by echocardiogram January 2018 was 50-55 with mild diastolic dysfunction.  Overall, volume status seems to be stable.  He is NYHA 2.  Continue current therapy.  3. CKD (chronic kidney disease) stage 3, GFR 30-59 ml/min (HCC) Most recent creatinine stable at 1.2.  4. Essential hypertension The patient's blood pressure is controlled on his current regimen.  Continue current therapy.   5. Mixed hyperlipidemia LDL optimal on most recent lab work.  Continue current Rx.    6. Type 2 diabetes mellitus with other circulatory complication, without long-term current use of insulin ([place]) Managed by primary care.  Empagliflozin could be considered given the results of the EMPA-REG OUTCOME trial.  He is somewhat hesitant as he suspects that his girlfriend passed away in [id number] due to complications related to an  adverse reaction to this drug.   Dispo:  Return in about 6 months (around 12/16/2019) for Routine Follow Up, w/ Dr. [name].   Medication Adjustments/Labs and Tests Ordered: Current medicines are reviewed at length with the patient today.  Concerns regarding medicines are outlined above.  Tests Ordered: Orders Placed This Encounter  Procedures  . EKG 12-Lead   Medication Changes: No orders of the defined types were placed in this encounter.   Signed, Richardson Dopp, PA-C  06/18/2019 5:50 PM    [facility] [place], [place], [place]  [address] Phone: [phone]; Fax: [phone]

## 2019-07-01 DIAGNOSIS — H43822 Vitreomacular adhesion, left eye: Secondary | ICD-10-CM | POA: Diagnosis not present

## 2019-07-01 DIAGNOSIS — E119 Type 2 diabetes mellitus without complications: Secondary | ICD-10-CM | POA: Diagnosis not present

## 2019-07-01 DIAGNOSIS — H2513 Age-related nuclear cataract, bilateral: Secondary | ICD-10-CM | POA: Diagnosis not present

## 2019-07-09 ENCOUNTER — Other Ambulatory Visit: Payer: Self-pay | Admitting: Cardiology

## 2019-07-10 DIAGNOSIS — Z08 Encounter for follow-up examination after completed treatment for malignant neoplasm: Secondary | ICD-10-CM | POA: Diagnosis not present

## 2019-07-10 DIAGNOSIS — D044 Carcinoma in situ of skin of scalp and neck: Secondary | ICD-10-CM | POA: Diagnosis not present

## 2019-07-10 DIAGNOSIS — C44311 Basal cell carcinoma of skin of nose: Secondary | ICD-10-CM | POA: Diagnosis not present

## 2019-07-10 DIAGNOSIS — D225 Melanocytic nevi of trunk: Secondary | ICD-10-CM | POA: Diagnosis not present

## 2019-07-10 DIAGNOSIS — D485 Neoplasm of uncertain behavior of skin: Secondary | ICD-10-CM | POA: Diagnosis not present

## 2019-07-10 DIAGNOSIS — Z85828 Personal history of other malignant neoplasm of skin: Secondary | ICD-10-CM | POA: Diagnosis not present

## 2019-07-10 DIAGNOSIS — L570 Actinic keratosis: Secondary | ICD-10-CM | POA: Diagnosis not present

## 2019-07-10 DIAGNOSIS — L821 Other seborrheic keratosis: Secondary | ICD-10-CM | POA: Diagnosis not present

## 2019-07-10 DIAGNOSIS — D0439 Carcinoma in situ of skin of other parts of face: Secondary | ICD-10-CM | POA: Diagnosis not present

## 2019-07-10 DIAGNOSIS — D2261 Melanocytic nevi of right upper limb, including shoulder: Secondary | ICD-10-CM | POA: Diagnosis not present

## 2019-07-10 DIAGNOSIS — D2262 Melanocytic nevi of left upper limb, including shoulder: Secondary | ICD-10-CM | POA: Diagnosis not present

## 2019-07-10 DIAGNOSIS — X32XXXA Exposure to sunlight, initial encounter: Secondary | ICD-10-CM | POA: Diagnosis not present

## 2019-07-10 DIAGNOSIS — C44622 Squamous cell carcinoma of skin of right upper limb, including shoulder: Secondary | ICD-10-CM | POA: Diagnosis not present

## 2019-07-23 ENCOUNTER — Other Ambulatory Visit: Payer: Self-pay | Admitting: Cardiovascular Disease

## 2019-08-06 DIAGNOSIS — L570 Actinic keratosis: Secondary | ICD-10-CM | POA: Diagnosis not present

## 2019-08-06 DIAGNOSIS — D044 Carcinoma in situ of skin of scalp and neck: Secondary | ICD-10-CM | POA: Diagnosis not present

## 2019-08-06 DIAGNOSIS — C44622 Squamous cell carcinoma of skin of right upper limb, including shoulder: Secondary | ICD-10-CM | POA: Diagnosis not present

## 2019-08-06 DIAGNOSIS — D045 Carcinoma in situ of skin of trunk: Secondary | ICD-10-CM | POA: Diagnosis not present

## 2019-08-25 DIAGNOSIS — E1165 Type 2 diabetes mellitus with hyperglycemia: Secondary | ICD-10-CM | POA: Diagnosis not present

## 2019-09-16 DIAGNOSIS — C44311 Basal cell carcinoma of skin of nose: Secondary | ICD-10-CM | POA: Diagnosis not present

## 2019-09-20 ENCOUNTER — Other Ambulatory Visit: Payer: Self-pay | Admitting: Physician Assistant

## 2019-10-10 ENCOUNTER — Other Ambulatory Visit: Payer: Self-pay | Admitting: Cardiology

## 2019-11-12 ENCOUNTER — Other Ambulatory Visit: Payer: Self-pay | Admitting: Cardiovascular Disease

## 2019-11-25 DIAGNOSIS — E1151 Type 2 diabetes mellitus with diabetic peripheral angiopathy without gangrene: Secondary | ICD-10-CM | POA: Diagnosis not present

## 2019-11-25 DIAGNOSIS — Z125 Encounter for screening for malignant neoplasm of prostate: Secondary | ICD-10-CM | POA: Diagnosis not present

## 2019-11-26 DIAGNOSIS — H43823 Vitreomacular adhesion, bilateral: Secondary | ICD-10-CM | POA: Diagnosis not present

## 2019-11-26 DIAGNOSIS — H2513 Age-related nuclear cataract, bilateral: Secondary | ICD-10-CM | POA: Diagnosis not present

## 2019-11-26 DIAGNOSIS — E119 Type 2 diabetes mellitus without complications: Secondary | ICD-10-CM | POA: Diagnosis not present

## 2019-11-26 DIAGNOSIS — H43392 Other vitreous opacities, left eye: Secondary | ICD-10-CM | POA: Diagnosis not present

## 2019-12-01 DIAGNOSIS — E785 Hyperlipidemia, unspecified: Secondary | ICD-10-CM | POA: Diagnosis not present

## 2019-12-01 DIAGNOSIS — E119 Type 2 diabetes mellitus without complications: Secondary | ICD-10-CM | POA: Diagnosis not present

## 2019-12-01 DIAGNOSIS — M1990 Unspecified osteoarthritis, unspecified site: Secondary | ICD-10-CM | POA: Diagnosis not present

## 2019-12-01 DIAGNOSIS — K625 Hemorrhage of anus and rectum: Secondary | ICD-10-CM | POA: Diagnosis not present

## 2019-12-01 DIAGNOSIS — I509 Heart failure, unspecified: Secondary | ICD-10-CM | POA: Diagnosis not present

## 2019-12-01 DIAGNOSIS — I429 Cardiomyopathy, unspecified: Secondary | ICD-10-CM | POA: Diagnosis not present

## 2019-12-01 DIAGNOSIS — Z0000 Encounter for general adult medical examination without abnormal findings: Secondary | ICD-10-CM | POA: Diagnosis not present

## 2019-12-01 DIAGNOSIS — I2510 Atherosclerotic heart disease of native coronary artery without angina pectoris: Secondary | ICD-10-CM | POA: Diagnosis not present

## 2019-12-19 ENCOUNTER — Ambulatory Visit: Payer: Medicare HMO | Admitting: Cardiovascular Disease

## 2019-12-19 ENCOUNTER — Encounter: Payer: Self-pay | Admitting: Cardiovascular Disease

## 2019-12-19 ENCOUNTER — Other Ambulatory Visit: Payer: Self-pay

## 2019-12-19 VITALS — BP 142/64 | HR 65 | Resp 15 | Ht 67.0 in | Wt 187.2 lb

## 2019-12-19 DIAGNOSIS — I2510 Atherosclerotic heart disease of native coronary artery without angina pectoris: Secondary | ICD-10-CM

## 2019-12-19 DIAGNOSIS — I10 Essential (primary) hypertension: Secondary | ICD-10-CM | POA: Diagnosis not present

## 2019-12-19 DIAGNOSIS — E782 Mixed hyperlipidemia: Secondary | ICD-10-CM | POA: Diagnosis not present

## 2019-12-19 MED ORDER — ASPIRIN EC 81 MG PO TBEC: 81.0000 mg | DELAYED_RELEASE_TABLET | Freq: Every day | ORAL | 3 refills | Status: AC

## 2019-12-19 NOTE — Patient Instructions (Signed)
Medication Instructions:  1) STOP PLAVIX 2) START ASPIRIN 81 mg dialy *If you need a refill on your cardiac medications before your next appointment, please call your pharmacy*   Follow-Up: At [facility], you and your health needs are our priority.  As part of our continuing mission to provide you with exceptional heart care, we have created designated Provider Care Teams.  These Care Teams include your primary Cardiologist (physician) and Advanced Practice Providers (APPs -  Physician Assistants and Nurse Practitioners) who all work together to provide you with the care you need, when you need it. Your next appointment:   12 month(s) The format for your next appointment:   In Person Provider:   You may see Sherren Mocha, MD or one of the following Advanced Practice Providers on your designated Care Team:    Richardson Dopp, PA-C  [place], [place]

## 2019-12-19 NOTE — Progress Notes (Signed)
Cardiology Office Note:    Date:  12/19/2019   ID:  JERAL ZICK, DOB April 18, 1946, MRN [id number]  PCP:  [name], MD  Cardiologist:  Sherren Mocha, MD  Electrophysiologist:  None   Referring MD: [name], MD   Chief Complaint  Patient presents with  . Coronary Artery Disease    History of Present Illness:    Gilbert Obrien is a 74 y.o. male with a hx of:  Coronary artery disease ? S/p inferolateral/posterior STEMI 7/17 >> multivessel PCI  ? S/p DES to the LCx, staged DES to the LAD and orbital atherectomy +DES to pRCA  Ischemic cardiomyopathy ? 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  He denies chest pain or chest pressure.  He has stable dyspnea with exertion which is unchanged over time.  He has had some problems with hemorrhoidal bleeding and he has set up for a colonoscopy in the near future.  He is also noted some concerns about clopidogrel and some drug interactions that he has encountered.  Otherwise doing well at this time.  Past Medical History:  Diagnosis Date  . Arthritis   . Coronary artery disease    a. STEMI 03/2016 DESx1 to LCx, DES x 1 Mid LAD, DES x1 prox RCA  . Depression   . Diabetes mellitus without complication ([place])    type 2  . History of acute inferior wall myocardial infarction 04/15/2016   inf-lat/post >> PCI with DES of LCx; staged PCI of LAD and RCA  . Hypercholesteremia   . Hypertension   . Ischemic cardiomyopathy 04/19/2016   A. Inf-lat/post STEMI 7/17 >> b. Echo 04/17/16: Septal and post lateral HK, poor image quality, EF 35-40% // b. Echo 11/17: mild LVH, EF 50-55, inf-lat and ant-lat HK, Gr 1 DD, borderline dilated aortic root (37 mm), MAC  . Leg pain    ABIs 3/19:  normal    Past Surgical History:  Procedure Laterality Date  . CARDIAC  CATHETERIZATION N/A 04/15/2016   Procedure: Left Heart Cath and Coronary Angiography;  Surgeon: Sherren Mocha, MD;  Location: [facility];  Service: Cardiovascular;  Laterality: N/A;  . CARDIAC CATHETERIZATION N/A 04/15/2016   Procedure: Coronary Stent Intervention;  Surgeon: Sherren Mocha, MD;  Location: [facility];  Service: Cardiovascular;  Laterality: N/A;  . CARDIAC CATHETERIZATION N/A 04/17/2016   Procedure: Coronary Stent Intervention;  Surgeon: Burnell Blanks, MD;  Location: [facility];  Service: Cardiovascular;  Laterality: N/A;  . CARDIAC CATHETERIZATION N/A 04/18/2016   Procedure: Coronary Stent Intervention;  Surgeon: Burnell Blanks, MD;  Location: [facility];  Service: Cardiovascular;  Laterality: N/A;  . CARDIAC CATHETERIZATION N/A 04/18/2016   Procedure: Temporary Pacemaker;  Surgeon: Burnell Blanks, MD;  Location: [facility];  Service: Cardiovascular;  Laterality: N/A;  . CORONARY STENT PLACEMENT  04/17/2016    Severe stenosis proximal LAD, now s/p successful PTCA/DES x 1 proximal and mid LAD  . COSMETIC SURGERY  1974   right side facial       . SHOULDER SURGERY      Current Medications: Current Meds  Medication Sig  . acetaminophen (TYLENOL) 325 MG tablet Take 2 tablets (650 mg total) by mouth every 4 (four) hours as needed for mild pain (or temp >  37.5 C (99.5 F)).  Marland Kitchen amLODipine (NORVASC) 10 MG tablet TAKE 1 TABLET EVERY DAY  . atorvastatin (LIPITOR) 80 MG tablet Take 1 tablet (80 mg total) by mouth daily at 6 PM.  . carvedilol (COREG) 25 MG tablet TAKE 1 TABLET TWICE DAILY  . furosemide (LASIX) 20 MG tablet TAKE 1 TABLET EVERY DAY  . glimepiride (AMARYL) 2 MG tablet Take 2 mg by mouth daily with breakfast.   . nitroGLYCERIN (NITROSTAT) 0.4 MG SL tablet Place 1 tablet (0.4 mg total) under the tongue every 5 (five) minutes x 3 doses as needed for chest pain.  . ramipril (ALTACE) 5 MG capsule TAKE 1 CAPSULE TWICE  DAILY  . sertraline (ZOLOFT) 50 MG tablet Take 50 mg by mouth daily.  . temazepam (RESTORIL) 15 MG capsule Take 15 mg by mouth at bedtime as needed for sleep.  . [DISCONTINUED] clopidogrel (PLAVIX) 75 MG tablet TAKE 1 TABLET EVERY DAY     Allergies:   Celecoxib and Sulfa antibiotics   Social History   Socioeconomic History  . Marital status: Married    Spouse name: Not on file  . Number of children: Not on file  . Years of education: Not on file  . Highest education level: Not on file  Occupational History  . Not on file  Tobacco Use  . Smoking status: Former Smoker    Quit date: 09/25/2000    Years since quitting: 19.2  . Smokeless tobacco: Never Used  Substance and Sexual Activity  . Alcohol use: No  . Drug use: No  . Sexual activity: Not on file  Other Topics Concern  . Not on file  Social History Narrative  . Not on file   Social Determinants of Health   Financial Resource Strain:   . Difficulty of Paying Living Expenses:   Food Insecurity:   . Worried About Charity fundraiser in the Last Year:   . Arboriculturist in the Last Year:   Transportation Needs:   . Film/video editor (Medical):   Marland Kitchen Lack of Transportation (Non-Medical):   Physical Activity:   . Days of Exercise per Week:   . Minutes of Exercise per Session:   Stress:   . Feeling of Stress :   Social Connections:   . Frequency of Communication with Friends and Family:   . Frequency of Social Gatherings with Friends and Family:   . Attends Religious Services:   . Active Member of Clubs or Organizations:   . Attends Archivist Meetings:   Marland Kitchen Marital Status:      Family History: The patient's family history includes Heart attack in his brother; Heart disease in his brother.  ROS:   Please see the history of present illness.    All other systems reviewed and are negative.  EKGs/Labs/Other Studies Reviewed:    EKG:  EKG is not ordered today.   Recent Labs: No results found for  requested labs within last 8760 hours.  Recent Lipid Panel    Component Value Date/Time   CHOL 107 10/31/2017 1053   TRIG 85 10/31/2017 1053   HDL 33 (L) 10/31/2017 1053   CHOLHDL 3.2 10/31/2017 1053   CHOLHDL 3.7 10/04/2016 0634   VLDL 21 10/04/2016 0634   LDLCALC 57 10/31/2017 1053    Physical Exam:    VS:  BP (!) 142/64   Pulse 65   Resp 15   Ht _0  (1.702 m)   Wt 187 lb 3.2  oz (84.9 kg)   SpO2 96%   BMI 29.32 kg/m     Wt Readings from Last 3 Encounters:  12/19/19 187 lb 3.2 oz (84.9 kg)  06/18/19 188 lb 12.8 oz (85.6 kg)  12/03/18 182 lb (82.6 kg)     GEN:  Well nourished, well developed in no acute distress HEENT: Normal NECK: No JVD; No carotid bruits LYMPHATICS: No lymphadenopathy CARDIAC: RRR, no murmurs, rubs, gallops RESPIRATORY:  Clear to auscultation without rales, wheezing or rhonchi  ABDOMEN: Soft, non-tender, non-distended MUSCULOSKELETAL:  No edema; No deformity  SKIN: Warm and dry NEUROLOGIC:  Alert and oriented x 3 PSYCHIATRIC:  Normal affect   ASSESSMENT:    1. Coronary artery disease involving native coronary artery of native heart without angina pectoris   2. Mixed hyperlipidemia   3. Essential hypertension    PLAN:    In order of problems listed above:  1. Stable without any anginal symptoms.  Reviewed his medications and discussed clopidogrel.  He is now out beyond 3 years from his heart attack and I feel like we can switch him back to aspirin 81 mg daily at no significant increased risk.  He will stop clopidogrel.  Otherwise continue current treatment which includes a high intensity statin drug and an ACE inhibitor and beta-blocker. 2. Lipids treated with atorvastatin 80 mg daily.  Last LDL cholesterol on file was 60 mg/dL. 3. Blood pressure is controlled on amlodipine, carvedilol, and ramipril.     Medication Adjustments/Labs and Tests Ordered: Current medicines are reviewed at length with the patient today.  Concerns regarding  medicines are outlined above.  No orders of the defined types were placed in this encounter.  Meds ordered this encounter  Medications  . aspirin EC 81 MG tablet    Sig: Take 1 tablet (81 mg total) by mouth daily.    Dispense:  90 tablet    Refill:  3    Patient Instructions  Medication Instructions:  1) STOP PLAVIX 2) START ASPIRIN 81 mg dialy *If you need a refill on your cardiac medications before your next appointment, please call your pharmacy*   Follow-Up: At [facility], you and your health needs are our priority.  As part of our continuing mission to provide you with exceptional heart care, we have created designated Provider Care Teams.  These Care Teams include your primary Cardiologist (physician) and Advanced Practice Providers (APPs -  Physician Assistants and Nurse Practitioners) who all work together to provide you with the care you need, when you need it. Your next appointment:   12 month(s) The format for your next appointment:   In Person Provider:   You may see Sherren Mocha, MD or one of the following Advanced Practice Providers on your designated Care Team:    Richardson Dopp, PA-C  Robbie Lis, [place]      Signed, Sherren Mocha, MD  12/19/2019 2:42 PM    [place]

## 2020-01-12 DIAGNOSIS — I255 Ischemic cardiomyopathy: Secondary | ICD-10-CM | POA: Diagnosis not present

## 2020-01-12 DIAGNOSIS — E1151 Type 2 diabetes mellitus with diabetic peripheral angiopathy without gangrene: Secondary | ICD-10-CM | POA: Diagnosis not present

## 2020-01-12 DIAGNOSIS — Z8371 Family history of colonic polyps: Secondary | ICD-10-CM | POA: Diagnosis not present

## 2020-01-12 DIAGNOSIS — K625 Hemorrhage of anus and rectum: Secondary | ICD-10-CM | POA: Diagnosis not present

## 2020-01-12 DIAGNOSIS — E782 Mixed hyperlipidemia: Secondary | ICD-10-CM | POA: Diagnosis not present

## 2020-01-12 DIAGNOSIS — I2510 Atherosclerotic heart disease of native coronary artery without angina pectoris: Secondary | ICD-10-CM | POA: Diagnosis not present

## 2020-01-12 DIAGNOSIS — I5023 Acute on chronic systolic (congestive) heart failure: Secondary | ICD-10-CM | POA: Diagnosis not present

## 2020-01-12 DIAGNOSIS — R198 Other specified symptoms and signs involving the digestive system and abdomen: Secondary | ICD-10-CM | POA: Diagnosis not present

## 2020-02-04 DIAGNOSIS — D225 Melanocytic nevi of trunk: Secondary | ICD-10-CM | POA: Diagnosis not present

## 2020-02-04 DIAGNOSIS — C44229 Squamous cell carcinoma of skin of left ear and external auricular canal: Secondary | ICD-10-CM | POA: Diagnosis not present

## 2020-02-04 DIAGNOSIS — L570 Actinic keratosis: Secondary | ICD-10-CM | POA: Diagnosis not present

## 2020-02-04 DIAGNOSIS — D485 Neoplasm of uncertain behavior of skin: Secondary | ICD-10-CM | POA: Diagnosis not present

## 2020-02-04 DIAGNOSIS — X32XXXA Exposure to sunlight, initial encounter: Secondary | ICD-10-CM | POA: Diagnosis not present

## 2020-02-04 DIAGNOSIS — Z08 Encounter for follow-up examination after completed treatment for malignant neoplasm: Secondary | ICD-10-CM | POA: Diagnosis not present

## 2020-02-04 DIAGNOSIS — L738 Other specified follicular disorders: Secondary | ICD-10-CM | POA: Diagnosis not present

## 2020-02-04 DIAGNOSIS — Z85828 Personal history of other malignant neoplasm of skin: Secondary | ICD-10-CM | POA: Diagnosis not present

## 2020-03-11 DIAGNOSIS — C44229 Squamous cell carcinoma of skin of left ear and external auricular canal: Secondary | ICD-10-CM | POA: Diagnosis not present

## 2020-03-11 DIAGNOSIS — C4442 Squamous cell carcinoma of skin of scalp and neck: Secondary | ICD-10-CM | POA: Diagnosis not present

## 2020-03-24 ENCOUNTER — Inpatient Hospital Stay
Admission: EM | Admit: 2020-03-24 | Discharge: 2020-03-28 | DRG: 445 | Disposition: A | Discharge status: Home / Self Care | Payer: Medicare HMO | Attending: Surgery | Admitting: Surgery

## 2020-03-24 ENCOUNTER — Other Ambulatory Visit: Payer: Self-pay

## 2020-03-24 ENCOUNTER — Emergency Department: Payer: Medicare HMO

## 2020-03-24 DIAGNOSIS — E785 Hyperlipidemia, unspecified: Secondary | ICD-10-CM | POA: Diagnosis present

## 2020-03-24 DIAGNOSIS — N183 Chronic kidney disease, stage 3 unspecified: Secondary | ICD-10-CM | POA: Diagnosis present

## 2020-03-24 DIAGNOSIS — I255 Ischemic cardiomyopathy: Secondary | ICD-10-CM | POA: Diagnosis present

## 2020-03-24 DIAGNOSIS — I10 Essential (primary) hypertension: Secondary | ICD-10-CM | POA: Diagnosis present

## 2020-03-24 DIAGNOSIS — K810 Acute cholecystitis: Secondary | ICD-10-CM | POA: Diagnosis present

## 2020-03-24 DIAGNOSIS — Z8249 Family history of ischemic heart disease and other diseases of the circulatory system: Secondary | ICD-10-CM

## 2020-03-24 DIAGNOSIS — K8020 Calculus of gallbladder without cholecystitis without obstruction: Secondary | ICD-10-CM | POA: Diagnosis not present

## 2020-03-24 DIAGNOSIS — D72829 Elevated white blood cell count, unspecified: Secondary | ICD-10-CM | POA: Diagnosis not present

## 2020-03-24 DIAGNOSIS — E1122 Type 2 diabetes mellitus with diabetic chronic kidney disease: Secondary | ICD-10-CM | POA: Diagnosis present

## 2020-03-24 DIAGNOSIS — Z7982 Long term (current) use of aspirin: Secondary | ICD-10-CM

## 2020-03-24 DIAGNOSIS — R001 Bradycardia, unspecified: Secondary | ICD-10-CM | POA: Diagnosis present

## 2020-03-24 DIAGNOSIS — Z20822 Contact with and (suspected) exposure to covid-19: Secondary | ICD-10-CM | POA: Diagnosis present

## 2020-03-24 DIAGNOSIS — K819 Cholecystitis, unspecified: Secondary | ICD-10-CM | POA: Diagnosis not present

## 2020-03-24 DIAGNOSIS — I130 Hypertensive heart and chronic kidney disease with heart failure and stage 1 through stage 4 chronic kidney disease, or unspecified chronic kidney disease: Secondary | ICD-10-CM | POA: Diagnosis present

## 2020-03-24 DIAGNOSIS — Z881 Allergy status to other antibiotic agents status: Secondary | ICD-10-CM

## 2020-03-24 DIAGNOSIS — E876 Hypokalemia: Secondary | ICD-10-CM | POA: Diagnosis present

## 2020-03-24 DIAGNOSIS — I252 Old myocardial infarction: Secondary | ICD-10-CM

## 2020-03-24 DIAGNOSIS — F32A Depression, unspecified: Secondary | ICD-10-CM | POA: Diagnosis present

## 2020-03-24 DIAGNOSIS — Z87891 Personal history of nicotine dependence: Secondary | ICD-10-CM

## 2020-03-24 DIAGNOSIS — E7800 Pure hypercholesterolemia, unspecified: Secondary | ICD-10-CM | POA: Diagnosis present

## 2020-03-24 DIAGNOSIS — I2510 Atherosclerotic heart disease of native coronary artery without angina pectoris: Secondary | ICD-10-CM | POA: Diagnosis present

## 2020-03-24 DIAGNOSIS — R1013 Epigastric pain: Secondary | ICD-10-CM | POA: Diagnosis not present

## 2020-03-24 DIAGNOSIS — I152 Hypertension secondary to endocrine disorders: Secondary | ICD-10-CM | POA: Diagnosis present

## 2020-03-24 DIAGNOSIS — I5032 Chronic diastolic (congestive) heart failure: Secondary | ICD-10-CM | POA: Diagnosis present

## 2020-03-24 DIAGNOSIS — Z955 Presence of coronary angioplasty implant and graft: Secondary | ICD-10-CM | POA: Diagnosis not present

## 2020-03-24 DIAGNOSIS — Z720 Tobacco use: Secondary | ICD-10-CM | POA: Diagnosis present

## 2020-03-24 DIAGNOSIS — F329 Major depressive disorder, single episode, unspecified: Secondary | ICD-10-CM | POA: Diagnosis present

## 2020-03-24 DIAGNOSIS — N1831 Chronic kidney disease, stage 3a: Secondary | ICD-10-CM | POA: Diagnosis present

## 2020-03-24 DIAGNOSIS — Z03818 Encounter for observation for suspected exposure to other biological agents ruled out: Secondary | ICD-10-CM | POA: Diagnosis not present

## 2020-03-24 DIAGNOSIS — Z79899 Other long term (current) drug therapy: Secondary | ICD-10-CM | POA: Diagnosis not present

## 2020-03-24 DIAGNOSIS — E1169 Type 2 diabetes mellitus with other specified complication: Secondary | ICD-10-CM | POA: Diagnosis present

## 2020-03-24 DIAGNOSIS — Z882 Allergy status to sulfonamides status: Secondary | ICD-10-CM | POA: Diagnosis not present

## 2020-03-24 DIAGNOSIS — J984 Other disorders of lung: Secondary | ICD-10-CM | POA: Diagnosis not present

## 2020-03-24 DIAGNOSIS — R519 Headache, unspecified: Secondary | ICD-10-CM | POA: Diagnosis present

## 2020-03-24 DIAGNOSIS — E1121 Type 2 diabetes mellitus with diabetic nephropathy: Secondary | ICD-10-CM | POA: Diagnosis not present

## 2020-03-24 DIAGNOSIS — R109 Unspecified abdominal pain: Secondary | ICD-10-CM

## 2020-03-24 DIAGNOSIS — E1129 Type 2 diabetes mellitus with other diabetic kidney complication: Secondary | ICD-10-CM | POA: Diagnosis present

## 2020-03-24 LAB — CBC
HCT: 39.9 % (ref 39.0–52.0)
Hemoglobin: 14.5 g/dL (ref 13.0–17.0)
MCH: 29.5 pg (ref 26.0–34.0)
MCHC: 36.3 g/dL — ABNORMAL HIGH (ref 30.0–36.0)
MCV: 81.3 fL (ref 80.0–100.0)
Platelets: 234 10*3/uL (ref 150–400)
RBC: 4.91 MIL/uL (ref 4.22–5.81)
RDW: 13 % (ref 11.5–15.5)
WBC: 13.9 10*3/uL — ABNORMAL HIGH (ref 4.0–10.5)
nRBC: 0 % (ref 0.0–0.2)

## 2020-03-24 LAB — HEPATIC FUNCTION PANEL
ALT: 20 U/L (ref 0–44)
AST: 18 U/L (ref 15–41)
Albumin: 3.9 g/dL (ref 3.5–5.0)
Alkaline Phosphatase: 91 U/L (ref 38–126)
Bilirubin, Direct: 0.2 mg/dL (ref 0.0–0.2)
Indirect Bilirubin: 1.4 mg/dL — ABNORMAL HIGH (ref 0.3–0.9)
Total Bilirubin: 1.6 mg/dL — ABNORMAL HIGH (ref 0.3–1.2)
Total Protein: 7.5 g/dL (ref 6.5–8.1)

## 2020-03-24 LAB — BASIC METABOLIC PANEL
Anion gap: 11 (ref 5–15)
BUN: 15 mg/dL (ref 8–23)
CO2: 23 mmol/L (ref 22–32)
Calcium: 9.7 mg/dL (ref 8.9–10.3)
Chloride: 104 mmol/L (ref 98–111)
Creatinine, Ser: 1.31 mg/dL — ABNORMAL HIGH (ref 0.61–1.24)
GFR calc Af Amer: >60 mL/min (ref >60)
GFR calc non Af Amer: 53 mL/min — ABNORMAL LOW (ref >60)
Glucose, Bld: 184 mg/dL — ABNORMAL HIGH (ref 70–99)
Potassium: 3.4 mmol/L — ABNORMAL LOW (ref 3.5–5.1)
Sodium: 138 mmol/L (ref 135–145)

## 2020-03-24 LAB — LIPASE, BLOOD: Lipase: 34 U/L (ref 11–51)

## 2020-03-24 LAB — TROPONIN I (HIGH SENSITIVITY): Troponin I (High Sensitivity): 25 ng/L — ABNORMAL HIGH (ref <18)

## 2020-03-24 MED ORDER — ONDANSETRON HCL 4 MG/2ML IJ SOLN
4.0000 mg | Freq: Once | INTRAMUSCULAR | Status: AC
  Administered 2020-03-24: 4 mg via INTRAVENOUS
  Filled 2020-03-24: qty 2

## 2020-03-24 MED ORDER — SODIUM CHLORIDE 0.9% FLUSH
3.0000 mL | Freq: Once | INTRAVENOUS | Status: AC
  Administered 2020-03-24: 3 mL via INTRAVENOUS

## 2020-03-24 MED ORDER — MORPHINE SULFATE (PF) 4 MG/ML IV SOLN
4.0000 mg | Freq: Once | INTRAVENOUS | Status: AC
  Administered 2020-03-24: 4 mg via INTRAVENOUS
  Filled 2020-03-24: qty 1

## 2020-03-24 MED ORDER — PIPERACILLIN-TAZOBACTAM 3.375 G IVPB 30 MIN
3.3750 g | Freq: Once | INTRAVENOUS | Status: AC
  Administered 2020-03-24: 3.375 g via INTRAVENOUS
  Filled 2020-03-24: qty 50

## 2020-03-24 MED ORDER — FENTANYL CITRATE (PF) 100 MCG/2ML IJ SOLN
50.0000 ug | INTRAMUSCULAR | Status: DC | PRN
  Administered 2020-03-24: 50 ug via INTRAVENOUS
  Filled 2020-03-24: qty 2

## 2020-03-24 NOTE — ED Notes (Signed)
See triage note, pt reports mid abdominal pain that started yesterday with nausea and vomiting. Reports not eating today, only drank some milk. Denies fevers.  Abdomen tender upon palpitation and feels distended

## 2020-03-24 NOTE — ED Triage Notes (Signed)
PT to ED c/o 10/10 abd pain, upper mid that radiates to left chest . HX of gallbladder issues in 2017. 4 episodes of vomiting. NO fever/diarrhea.

## 2020-03-24 NOTE — ED Provider Notes (Signed)
[facility] Emergency Department Provider Note  ____________________________________________   First MD Initiated Contact with Patient 03/24/20 2315     (approximate)  I have reviewed the triage vital signs and the nursing notes.  History  Chief Complaint Abdominal Pain    HPI Gilbert Obrien is a 74 y.o. male past medical history as below, who presents to the emergency department for RUQ, epigastric, mid abdominal pain.  Symptoms first started last night after eating pizza.  They have been constant since onset and progressively worsening.  Pain currently 10/10 in severity, describes as sharp and burning.  Associated with nausea and several episodes of vomiting.  No changes to his bowel movements.  Denies any fevers.  Last took by mouth earlier this morning.  Reports a history of biliary colic, intermittent over the last few years.   Past Medical Hx Past Medical History:  Diagnosis Date   Arthritis    Coronary artery disease    a. STEMI 03/2016 DESx1 to LCx, DES x 1 Mid LAD, DES x1 prox RCA   Depression    Diabetes mellitus without complication ([place])    type 2   History of acute inferior wall myocardial infarction 04/15/2016   inf-lat/post >> PCI with DES of LCx; staged PCI of LAD and RCA   Hypercholesteremia    Hypertension    Ischemic cardiomyopathy 04/19/2016   A. Inf-lat/post STEMI 7/17 >> b. Echo 04/17/16: Septal and post lateral HK, poor image quality, EF 35-40% // b. Echo 11/17: mild LVH, EF 50-55, inf-lat and ant-lat HK, Gr 1 DD, borderline dilated aortic root (37 mm), MAC   Leg pain    ABIs 3/19:  normal    Problem List Patient Active Problem List   Diagnosis Date Noted   Chronic diastolic CHF (congestive heart failure) ([place]) 11/23/2017   Weakness    History of stroke 10/03/2016   History of CVA (cerebrovascular accident) 10/03/2016   Abdominal pain    Cholecystitis 08/15/2016   Essential hypertension 05/02/2016    Coronary artery disease involving native heart without angina pectoris 05/01/2016   HLD (hyperlipidemia) 05/01/2016   Hypokalemia 04/19/2016   Cardiomyopathy, ischemic 04/19/2016   Diabetes mellitus ([place]) 04/19/2016   CKD (chronic kidney disease) stage 3, GFR 30-59 ml/min 04/19/2016   Hematoma of arm 04/19/2016   History of myocardial infarction 04/15/2016    Past Surgical Hx Past Surgical History:  Procedure Laterality Date   CARDIAC CATHETERIZATION N/A 04/15/2016   Procedure: Left Heart Cath and Coronary Angiography;  Surgeon: Sherren Mocha, MD;  Location: [facility];  Service: Cardiovascular;  Laterality: N/A;   CARDIAC CATHETERIZATION N/A 04/15/2016   Procedure: Coronary Stent Intervention;  Surgeon: Sherren Mocha, MD;  Location: [facility];  Service: Cardiovascular;  Laterality: N/A;   CARDIAC CATHETERIZATION N/A 04/17/2016   Procedure: Coronary Stent Intervention;  Surgeon: Burnell Blanks, MD;  Location: [facility];  Service: Cardiovascular;  Laterality: N/A;   CARDIAC CATHETERIZATION N/A 04/18/2016   Procedure: Coronary Stent Intervention;  Surgeon: Burnell Blanks, MD;  Location: [facility];  Service: Cardiovascular;  Laterality: N/A;   CARDIAC CATHETERIZATION N/A 04/18/2016   Procedure: Temporary Pacemaker;  Surgeon: Burnell Blanks, MD;  Location: [facility];  Service: Cardiovascular;  Laterality: N/A;   CORONARY STENT PLACEMENT  04/17/2016    Severe stenosis proximal LAD, now s/p successful PTCA/DES x 1 proximal and mid LAD   COSMETIC SURGERY  1974   right  side facial        SHOULDER SURGERY      Medications Prior to Admission medications   Medication Sig Start Date End Date Taking? Authorizing Provider  acetaminophen (TYLENOL) 325 MG tablet Take 2 tablets (650 mg total) by mouth every 4 (four) hours as needed for mild pain (or temp > 37.5 C (99.5 F)). 10/05/16   Robbie Lis, MD  amLODipine  (NORVASC) 10 MG tablet TAKE 1 TABLET EVERY DAY 11/13/19   Sherren Mocha, MD  aspirin EC 81 MG tablet Take 1 tablet (81 mg total) by mouth daily. 12/19/19   Sherren Mocha, MD  atorvastatin (LIPITOR) 80 MG tablet Take 1 tablet (80 mg total) by mouth daily at 6 PM. 04/19/16   Arbutus Leas, NP  carvedilol (COREG) 25 MG tablet TAKE 1 TABLET TWICE DAILY 07/24/19   Sherren Mocha, MD  furosemide (LASIX) 20 MG tablet TAKE 1 TABLET EVERY DAY 07/10/19   Daune Perch, NP  glimepiride (AMARYL) 2 MG tablet Take 2 mg by mouth daily with breakfast.  10/17/17   [provider]  nitroGLYCERIN (NITROSTAT) 0.4 MG SL tablet Place 1 tablet (0.4 mg total) under the tongue every 5 (five) minutes x 3 doses as needed for chest pain. 09/11/17   Sherren Mocha, MD  ramipril (ALTACE) 5 MG capsule TAKE 1 CAPSULE TWICE DAILY 09/22/19   Sherren Mocha, MD  sertraline (ZOLOFT) 50 MG tablet Take 50 mg by mouth daily.    [provider]  temazepam (RESTORIL) 15 MG capsule Take 15 mg by mouth at bedtime as needed for sleep.    [provider]    Allergies Celecoxib and Sulfa antibiotics  Family Hx Family History  Problem Relation Age of Onset   Heart attack Brother    Heart disease Brother     Social Hx Social History   Tobacco Use   Smoking status: Former Smoker    Quit date: 09/25/2000    Years since quitting: 19.5   Smokeless tobacco: Never Used  Scientific laboratory technician Use: Never used  Substance Use Topics   Alcohol use: No   Drug use: No     Review of Systems  Constitutional: Negative for fever. Negative for chills. Eyes: Negative for visual changes. ENT: Negative for sore throat. Cardiovascular: Negative for chest pain. Respiratory: Negative for shortness of breath. Gastrointestinal: + nausea, vomiting, abdominal pain Genitourinary: Negative for dysuria. Musculoskeletal: Negative for leg swelling. Skin: Negative for rash. Neurological: Negative for  headaches.   Physical Exam  Vital Signs: ED Triage Vitals  Enc Vitals Group     BP 03/24/20 1803 (!) 195/69     Pulse Rate 03/24/20 1803 (!) 59     Resp 03/24/20 1803 (!) 22     Temp 03/24/20 1803 (!) 89.4 F (31.9 C)     Temp Source 03/24/20 1803 Oral     SpO2 03/24/20 1803 100 %     Weight 03/24/20 1804 180 lb (81.6 kg)     Height 03/24/20 1804 _0  (1.702 m)     Head Circumference --      Peak Flow --      Pain Score 03/24/20 1803 10     Pain Loc --      Pain Edu? --      Excl. in [place]? --     Constitutional: Alert and oriented. NAD.  Head: Normocephalic. Atraumatic. Eyes: Conjunctivae clear. Sclera anicteric. Pupils equal and symmetric. Nose: No masses or lesions.  No congestion or rhinorrhea. Mouth/Throat: Wearing mask.  Neck: No stridor. Trachea midline.  Cardiovascular: Borderline bradycardic, regular rhythm. Extremities well perfused. Respiratory: Normal respiratory effort.   Gastrointestinal: Soft. TTP in RUQ, epigastrium, and mid abdomen. No rebound or guarding.  Genitourinary: Deferred. Musculoskeletal: No lower extremity edema. No deformities. Neurologic:  Normal speech and language. No gross focal or lateralizing neurologic deficits are appreciated.  Skin: Skin is warm, dry and intact. No rash noted. Psychiatric: Mood and affect are appropriate for situation.  EKG  Personally reviewed and interpreted by myself.   Date: 03/24/20 Time: 1754 Rate: 55 Rhythm: sinus Axis: normal Intervals: WNL Sinus brady No acute ischemic changes No STEMI    Radiology  Personally reviewed available imaging myself.   CXR - IMPRESSION:  Bibasilar scarring or atelectasis. No active disease.   RUQ US - IMPRESSION:  Sonographic features are concerning for an acute cholecystitis.  Negative for biliary dilatation.    Procedures  Procedure(s) performed (including critical care):  Procedures   Initial Impression / Assessment and Plan / MDM / ED Course  74  y.o. male who presents to the ED for nausea, vomiting, abdominal pain as above.  Symptoms first started after eating pizza last night.  Has a history of biliary colic.  Ddx: biliary colic, symptomatic cholelithiasis, cholecystitis, GERD, atypical ACS  Will plan for labs, imaging, symptom control  Labs reveal leukocytosis to 13.9.  Bilirubin 1.6.  Normal LFTs and lipase.  RUQ ultrasound consistent with acute cholecystitis, no biliary dilatation.  Will cover with antibiotics and discuss with surgery and admit. Patient agreeable.   _______________________________   As part of my medical decision making I have reviewed available labs, radiology tests, reviewed old records/performed chart review, and discussed with consultants (surgery, Dr. Hampton Abbot).     Final Clinical Impression(s) / ED Diagnosis  Final diagnoses:  Abdominal pain  Cholecystitis       Note:  This document was prepared using Dragon voice recognition software and may include unintentional dictation errors.   Lilia Pro., MD 03/25/20 Adelfa Koh

## 2020-03-25 ENCOUNTER — Encounter
Admission: EM | Disposition: A | Discharge status: Home / Self Care | Payer: Self-pay | Source: Home / Self Care | Attending: Surgery

## 2020-03-25 ENCOUNTER — Encounter: Payer: Self-pay | Admitting: Surgery

## 2020-03-25 ENCOUNTER — Inpatient Hospital Stay: Payer: Medicare HMO

## 2020-03-25 DIAGNOSIS — I2510 Atherosclerotic heart disease of native coronary artery without angina pectoris: Secondary | ICD-10-CM | POA: Diagnosis present

## 2020-03-25 DIAGNOSIS — E876 Hypokalemia: Secondary | ICD-10-CM | POA: Diagnosis present

## 2020-03-25 DIAGNOSIS — Z955 Presence of coronary angioplasty implant and graft: Secondary | ICD-10-CM | POA: Diagnosis not present

## 2020-03-25 DIAGNOSIS — Z87891 Personal history of nicotine dependence: Secondary | ICD-10-CM | POA: Diagnosis not present

## 2020-03-25 DIAGNOSIS — I255 Ischemic cardiomyopathy: Secondary | ICD-10-CM | POA: Diagnosis present

## 2020-03-25 DIAGNOSIS — E785 Hyperlipidemia, unspecified: Secondary | ICD-10-CM | POA: Diagnosis present

## 2020-03-25 DIAGNOSIS — Z20822 Contact with and (suspected) exposure to covid-19: Secondary | ICD-10-CM | POA: Diagnosis present

## 2020-03-25 DIAGNOSIS — Z881 Allergy status to other antibiotic agents status: Secondary | ICD-10-CM | POA: Diagnosis not present

## 2020-03-25 DIAGNOSIS — Z7982 Long term (current) use of aspirin: Secondary | ICD-10-CM | POA: Diagnosis not present

## 2020-03-25 DIAGNOSIS — R001 Bradycardia, unspecified: Secondary | ICD-10-CM | POA: Diagnosis present

## 2020-03-25 DIAGNOSIS — K810 Acute cholecystitis: Secondary | ICD-10-CM

## 2020-03-25 DIAGNOSIS — I252 Old myocardial infarction: Secondary | ICD-10-CM | POA: Diagnosis not present

## 2020-03-25 DIAGNOSIS — R519 Headache, unspecified: Secondary | ICD-10-CM | POA: Diagnosis present

## 2020-03-25 DIAGNOSIS — Z882 Allergy status to sulfonamides status: Secondary | ICD-10-CM | POA: Diagnosis not present

## 2020-03-25 DIAGNOSIS — F329 Major depressive disorder, single episode, unspecified: Secondary | ICD-10-CM | POA: Diagnosis present

## 2020-03-25 DIAGNOSIS — I10 Essential (primary) hypertension: Secondary | ICD-10-CM | POA: Diagnosis not present

## 2020-03-25 DIAGNOSIS — N1831 Chronic kidney disease, stage 3a: Secondary | ICD-10-CM | POA: Diagnosis present

## 2020-03-25 DIAGNOSIS — Z8249 Family history of ischemic heart disease and other diseases of the circulatory system: Secondary | ICD-10-CM | POA: Diagnosis not present

## 2020-03-25 DIAGNOSIS — E7800 Pure hypercholesterolemia, unspecified: Secondary | ICD-10-CM | POA: Diagnosis present

## 2020-03-25 DIAGNOSIS — I5032 Chronic diastolic (congestive) heart failure: Secondary | ICD-10-CM | POA: Diagnosis present

## 2020-03-25 DIAGNOSIS — Z79899 Other long term (current) drug therapy: Secondary | ICD-10-CM | POA: Diagnosis not present

## 2020-03-25 DIAGNOSIS — I130 Hypertensive heart and chronic kidney disease with heart failure and stage 1 through stage 4 chronic kidney disease, or unspecified chronic kidney disease: Secondary | ICD-10-CM | POA: Diagnosis present

## 2020-03-25 DIAGNOSIS — E1121 Type 2 diabetes mellitus with diabetic nephropathy: Secondary | ICD-10-CM | POA: Diagnosis not present

## 2020-03-25 DIAGNOSIS — E1122 Type 2 diabetes mellitus with diabetic chronic kidney disease: Secondary | ICD-10-CM | POA: Diagnosis present

## 2020-03-25 HISTORY — DX: Acute cholecystitis: K81.0

## 2020-03-25 LAB — COMPREHENSIVE METABOLIC PANEL
ALT: 19 U/L (ref 0–44)
AST: 17 U/L (ref 15–41)
Albumin: 3.9 g/dL (ref 3.5–5.0)
Alkaline Phosphatase: 91 U/L (ref 38–126)
Anion gap: 13 (ref 5–15)
BUN: 14 mg/dL (ref 8–23)
CO2: 22 mmol/L (ref 22–32)
Calcium: 9 mg/dL (ref 8.9–10.3)
Chloride: 104 mmol/L (ref 98–111)
Creatinine, Ser: 1.43 mg/dL — ABNORMAL HIGH (ref 0.61–1.24)
GFR calc Af Amer: 56 mL/min — ABNORMAL LOW (ref >60)
GFR calc non Af Amer: 48 mL/min — ABNORMAL LOW (ref >60)
Glucose, Bld: 215 mg/dL — ABNORMAL HIGH (ref 70–99)
Potassium: 3.3 mmol/L — ABNORMAL LOW (ref 3.5–5.1)
Sodium: 139 mmol/L (ref 135–145)
Total Bilirubin: 2 mg/dL — ABNORMAL HIGH (ref 0.3–1.2)
Total Protein: 7.5 g/dL (ref 6.5–8.1)

## 2020-03-25 LAB — GLUCOSE, CAPILLARY
Glucose-Capillary: 210 mg/dL — ABNORMAL HIGH (ref 70–99)
Glucose-Capillary: 154 mg/dL — ABNORMAL HIGH (ref 70–99)
Glucose-Capillary: 113 mg/dL — ABNORMAL HIGH (ref 70–99)
Glucose-Capillary: 136 mg/dL — ABNORMAL HIGH (ref 70–99)
Glucose-Capillary: 157 mg/dL — ABNORMAL HIGH (ref 70–99)
Glucose-Capillary: 246 mg/dL — ABNORMAL HIGH (ref 70–99)
Glucose-Capillary: 97 mg/dL (ref 70–99)

## 2020-03-25 LAB — CBC
HCT: 40.7 % (ref 39.0–52.0)
Hemoglobin: 14 g/dL (ref 13.0–17.0)
MCH: 29.2 pg (ref 26.0–34.0)
MCHC: 34.4 g/dL (ref 30.0–36.0)
MCV: 85 fL (ref 80.0–100.0)
Platelets: 217 10*3/uL (ref 150–400)
RBC: 4.79 MIL/uL (ref 4.22–5.81)
RDW: 13.2 % (ref 11.5–15.5)
WBC: 17.7 10*3/uL — ABNORMAL HIGH (ref 4.0–10.5)
nRBC: 0 % (ref 0.0–0.2)

## 2020-03-25 LAB — PROTIME-INR
INR: 1 (ref 0.8–1.2)
Prothrombin Time: 12.9 seconds (ref 11.4–15.2)

## 2020-03-25 LAB — BILIRUBIN, DIRECT: Bilirubin, Direct: 0.3 mg/dL — ABNORMAL HIGH (ref 0.0–0.2)

## 2020-03-25 LAB — SARS CORONAVIRUS 2 BY RT PCR (HOSPITAL ORDER, PERFORMED IN ~~LOC~~ HOSPITAL LAB): SARS Coronavirus 2: NEGATIVE

## 2020-03-25 LAB — TROPONIN I (HIGH SENSITIVITY): Troponin I (High Sensitivity): 27 ng/L — ABNORMAL HIGH (ref <18)

## 2020-03-25 LAB — MAGNESIUM: Magnesium: 2 mg/dL (ref 1.7–2.4)

## 2020-03-25 SURGERY — CHOLECYSTECTOMY, ROBOT-ASSISTED, LAPAROSCOPIC
Anesthesia: General

## 2020-03-25 MED ORDER — PANTOPRAZOLE SODIUM 40 MG IV SOLR
40.0000 mg | Freq: Every day | INTRAVENOUS | Status: DC
  Administered 2020-03-25 – 2020-03-27 (×4): 40 mg via INTRAVENOUS
  Filled 2020-03-25 (×4): qty 40

## 2020-03-25 MED ORDER — AMLODIPINE BESYLATE 10 MG PO TABS
10.0000 mg | ORAL_TABLET | Freq: Every day | ORAL | Status: DC
  Administered 2020-03-25 – 2020-03-28 (×4): 10 mg via ORAL
  Filled 2020-03-25: qty 1
  Filled 2020-03-25: qty 2
  Filled 2020-03-25: qty 1
  Filled 2020-03-25: qty 2
  Filled 2020-03-25: qty 1

## 2020-03-25 MED ORDER — LACTATED RINGERS IV SOLN: INTRAVENOUS | Status: DC

## 2020-03-25 MED ORDER — PIPERACILLIN-TAZOBACTAM 3.375 G IVPB
3.3750 g | Freq: Three times a day (TID) | INTRAVENOUS | Status: DC
  Administered 2020-03-25 – 2020-03-27 (×7): 3.375 g via INTRAVENOUS
  Filled 2020-03-25 (×8): qty 50

## 2020-03-25 MED ORDER — INSULIN ASPART 100 UNIT/ML ~~LOC~~ SOLN
0.0000 [IU] | Freq: Three times a day (TID) | SUBCUTANEOUS | Status: DC
  Administered 2020-03-25 – 2020-03-26 (×2): 5 [IU] via SUBCUTANEOUS
  Administered 2020-03-26: 2 [IU] via SUBCUTANEOUS
  Administered 2020-03-27 – 2020-03-28 (×4): 3 [IU] via SUBCUTANEOUS
  Filled 2020-03-25 (×7): qty 1

## 2020-03-25 MED ORDER — ONDANSETRON HCL 4 MG/2ML IJ SOLN: 4.0000 mg | Freq: Four times a day (QID) | INTRAMUSCULAR | Status: DC | PRN

## 2020-03-25 MED ORDER — PIPERACILLIN-TAZOBACTAM 3.375 G IVPB: 3.3750 g | Freq: Three times a day (TID) | INTRAVENOUS | Status: DC

## 2020-03-25 MED ORDER — ATORVASTATIN CALCIUM 20 MG PO TABS
80.0000 mg | ORAL_TABLET | Freq: Every day | ORAL | Status: DC
  Administered 2020-03-25 – 2020-03-27 (×3): 80 mg via ORAL
  Filled 2020-03-25 (×3): qty 4

## 2020-03-25 MED ORDER — AMLODIPINE BESYLATE 5 MG PO TABS: 10.0000 mg | ORAL_TABLET | Freq: Every day | ORAL | Status: DC

## 2020-03-25 MED ORDER — POLYETHYLENE GLYCOL 3350 17 G PO PACK
17.0000 g | PACK | Freq: Every day | ORAL | Status: DC | PRN
  Administered 2020-03-26: 17 g via ORAL
  Filled 2020-03-25: qty 1

## 2020-03-25 MED ORDER — ACETAMINOPHEN 500 MG PO TABS
1000.0000 mg | ORAL_TABLET | Freq: Four times a day (QID) | ORAL | Status: DC
  Administered 2020-03-25 – 2020-03-28 (×12): 1000 mg via ORAL
  Filled 2020-03-25 (×12): qty 2

## 2020-03-25 MED ORDER — NITROGLYCERIN 0.4 MG SL SUBL: 0.4000 mg | SUBLINGUAL_TABLET | SUBLINGUAL | Status: DC | PRN

## 2020-03-25 MED ORDER — HYDRALAZINE HCL 20 MG/ML IJ SOLN
10.0000 mg | Freq: Four times a day (QID) | INTRAMUSCULAR | Status: DC | PRN
  Filled 2020-03-25: qty 1

## 2020-03-25 MED ORDER — FENTANYL CITRATE (PF) 100 MCG/2ML IJ SOLN
INTRAMUSCULAR | Status: AC
  Filled 2020-03-25: qty 2

## 2020-03-25 MED ORDER — RAMIPRIL 5 MG PO CAPS
5.0000 mg | ORAL_CAPSULE | Freq: Two times a day (BID) | ORAL | Status: DC
  Administered 2020-03-25 – 2020-03-28 (×7): 5 mg via ORAL
  Filled 2020-03-25 (×9): qty 1

## 2020-03-25 MED ORDER — HYDROMORPHONE HCL 1 MG/ML IJ SOLN
0.5000 mg | INTRAMUSCULAR | Status: DC | PRN
  Administered 2020-03-25 (×2): 0.5 mg via INTRAVENOUS
  Filled 2020-03-25 (×3): qty 1

## 2020-03-25 MED ORDER — CARVEDILOL 25 MG PO TABS
25.0000 mg | ORAL_TABLET | Freq: Two times a day (BID) | ORAL | Status: DC
  Administered 2020-03-25 – 2020-03-28 (×8): 25 mg via ORAL
  Filled 2020-03-25 (×8): qty 1

## 2020-03-25 MED ORDER — SERTRALINE HCL 50 MG PO TABS
50.0000 mg | ORAL_TABLET | Freq: Every day | ORAL | Status: DC
  Administered 2020-03-25 – 2020-03-28 (×4): 50 mg via ORAL
  Filled 2020-03-25 (×4): qty 1

## 2020-03-25 MED ORDER — FUROSEMIDE 20 MG PO TABS
20.0000 mg | ORAL_TABLET | Freq: Every day | ORAL | Status: DC
  Administered 2020-03-25 – 2020-03-26 (×2): 20 mg via ORAL
  Filled 2020-03-25 (×2): qty 1

## 2020-03-25 MED ORDER — MIDAZOLAM HCL 5 MG/5ML IJ SOLN
INTRAMUSCULAR | Status: AC
  Filled 2020-03-25: qty 5

## 2020-03-25 MED ORDER — ONDANSETRON 4 MG PO TBDP: 4.0000 mg | ORAL_TABLET | Freq: Four times a day (QID) | ORAL | Status: DC | PRN

## 2020-03-25 MED ORDER — TEMAZEPAM 15 MG PO CAPS
15.0000 mg | ORAL_CAPSULE | Freq: Every evening | ORAL | Status: DC | PRN
  Filled 2020-03-25: qty 1

## 2020-03-25 MED ORDER — SODIUM CHLORIDE 0.9% FLUSH
5.0000 mL | Freq: Three times a day (TID) | INTRAVENOUS | Status: DC
  Administered 2020-03-25 – 2020-03-28 (×10): 5 mL

## 2020-03-25 MED ORDER — FENTANYL CITRATE (PF) 100 MCG/2ML IJ SOLN
INTRAMUSCULAR | Status: AC | PRN
  Administered 2020-03-25 (×2): 25 ug via INTRAVENOUS

## 2020-03-25 MED ORDER — INSULIN ASPART 100 UNIT/ML ~~LOC~~ SOLN
0.0000 [IU] | SUBCUTANEOUS | Status: DC
  Administered 2020-03-25 (×2): 3 [IU] via SUBCUTANEOUS
  Administered 2020-03-25: 5 [IU] via SUBCUTANEOUS
  Filled 2020-03-25 (×3): qty 1

## 2020-03-25 MED ORDER — MIDAZOLAM HCL 5 MG/5ML IJ SOLN
INTRAMUSCULAR | Status: AC | PRN
  Administered 2020-03-25: 2 mg via INTRAVENOUS
  Administered 2020-03-25: 1 mg via INTRAVENOUS

## 2020-03-25 NOTE — Progress Notes (Signed)
Pt stable after pct chole.Vss.Abd stable.Tube patent.F/u with surgery.Thankyou.
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
## 2020-03-25 NOTE — ED Notes (Signed)
cbg 210

## 2020-03-25 NOTE — Progress Notes (Signed)
03/25/20  Patient presents with two day history of RUQ abdominal pain, associated with nausea and vomiting.  This started after eating pizza.  Has history of acute cholecystitis in 07/2016 but was treated conservatively due to recent cardiac stent placement at the time.  U/S shows a stone in the gallbladder neck, with wall thickening and pericholecystic fluid.  WBC 13.9, with total bilirubin of 1.6 (indirect 1.4).  Will admit to surgery team, keep NPO, continue IV abx, hydrate with gentle IV fluids, resume home meds except for ASA.  Will plan for robotic cholecystectomy on 03/25/20 in afternoon.  Full H&P to follow.  Olean Ree, MD

## 2020-03-25 NOTE — ED Notes (Signed)
Pt in CT scan.  Called to see if permission to call mother and update.  Pt will call mother from CT scan and speak with her.

## 2020-03-25 NOTE — H&P (Signed)
[facility] SURGICAL HISTORY & PHYSICAL (cpt [phone])  HISTORY OF PRESENT ILLNESS (HPI):  74 y.o. male presented to [facility] ED yesterday for abdominal pain. Patient reports around a 24 hour history of RUQ and epigastric abdominal pain. He believes this onset last night after he ate dinner, which consisted of pizza. He described this as an intense, sharp, burning pain which did not radiate anywhere. The pain has been a constant 10/10 since onset. Nothing seemed to make the pain better. He endorses associated nausea and multiple episodes of emesis with the pain. No fever, chills, CP, SOB, urinary changes, bowel changes, or juandice. He believes he has a history of biliary colic in the past very infrequently for years but this is much worse. No previous intra-abdominal surgeries. Work up in the ED was concerning for very mild hypokalemia to 3.4, renal function appears at baseline with sCr - 1.31,  Mild leukocytosis to 13.9K, and very mild hyperbilirubinemia to 1.6 with primarily indirect component, and RUQ [place] was concerning for cholecystitis.   General surgery is consulted by emergency medicine physician Dr Derrell Lolling, MD for evaluation and management of cholecystitis.    PAST MEDICAL HISTORY (PMH):  Past Medical History:  Diagnosis Date  . Arthritis   . Coronary artery disease    a. STEMI 03/2016 DESx1 to LCx, DES x 1 Mid LAD, DES x1 prox RCA  . Depression   . Diabetes mellitus without complication ([place])    type 2  . History of acute inferior wall myocardial infarction 04/15/2016   inf-lat/post >> PCI with DES of LCx; staged PCI of LAD and RCA  . Hypercholesteremia   . Hypertension   . Ischemic cardiomyopathy 04/19/2016   A. Inf-lat/post STEMI 7/17 >> b. Echo 04/17/16: Septal and post lateral HK, poor image quality, EF 35-40% // b. Echo 11/17: mild LVH, EF 50-55, inf-lat and ant-lat HK, Gr 1 DD, borderline dilated aortic root (37 mm), MAC  . Leg pain    ABIs 3/19:  normal     Reviewed. Otherwise negative.   PAST SURGICAL HISTORY ([place]):  Past Surgical History:  Procedure Laterality Date  . CARDIAC CATHETERIZATION N/A 04/15/2016   Procedure: Left Heart Cath and Coronary Angiography;  Surgeon: Sherren Mocha, MD;  Location: [place] [facility];  Service: Cardiovascular;  Laterality: N/A;  . CARDIAC CATHETERIZATION N/A 04/15/2016   Procedure: Coronary Stent Intervention;  Surgeon: Sherren Mocha, MD;  Location: [place] [facility];  Service: Cardiovascular;  Laterality: N/A;  . CARDIAC CATHETERIZATION N/A 04/17/2016   Procedure: Coronary Stent Intervention;  Surgeon: Burnell Blanks, MD;  Location: [place] [facility];  Service: Cardiovascular;  Laterality: N/A;  . CARDIAC CATHETERIZATION N/A 04/18/2016   Procedure: Coronary Stent Intervention;  Surgeon: Burnell Blanks, MD;  Location: [place] [facility];  Service: Cardiovascular;  Laterality: N/A;  . CARDIAC CATHETERIZATION N/A 04/18/2016   Procedure: Temporary Pacemaker;  Surgeon: Burnell Blanks, MD;  Location: [place] [facility];  Service: Cardiovascular;  Laterality: N/A;  . CORONARY STENT PLACEMENT  04/17/2016    Severe stenosis proximal LAD, now s/p successful PTCA/DES x 1 proximal and mid LAD  . COSMETIC SURGERY  1974   right side facial       . SHOULDER SURGERY      Reviewed. Otherwise negative.   MEDICATIONS:  Prior to Admission medications   Medication Sig Start Date End Date Taking? Authorizing Provider  acetaminophen (TYLENOL) 325 MG tablet Take 2 tablets (650 mg total) by mouth  every 4 (four) hours as needed for mild pain (or temp > 37.5 C (99.5 F)). 10/05/16  Yes Robbie Lis, MD  amLODipine (NORVASC) 10 MG tablet TAKE 1 TABLET EVERY DAY 11/13/19  Yes Sherren Mocha, MD  aspirin EC 81 MG tablet Take 1 tablet (81 mg total) by mouth daily. 12/19/19  Yes Sherren Mocha, MD  atorvastatin (LIPITOR) 80 MG tablet Take 1 tablet (80 mg total) by mouth daily at 6 PM. 04/19/16  Yes Arbutus Leas, NP  carvedilol (COREG) 25 MG tablet TAKE 1 TABLET TWICE DAILY 07/24/19  Yes Sherren Mocha, MD  furosemide (LASIX) 20 MG tablet TAKE 1 TABLET EVERY DAY 07/10/19  Yes Daune Perch, NP  glimepiride (AMARYL) 2 MG tablet Take 2 mg by mouth in the morning and at bedtime.  10/17/17  Yes [provider]  nitroGLYCERIN (NITROSTAT) 0.4 MG SL tablet Place 1 tablet (0.4 mg total) under the tongue every 5 (five) minutes x 3 doses as needed for chest pain. 09/11/17  Yes Sherren Mocha, MD  ramipril (ALTACE) 5 MG capsule TAKE 1 CAPSULE TWICE DAILY 09/22/19  Yes Sherren Mocha, MD  sertraline (ZOLOFT) 50 MG tablet Take 50 mg by mouth daily.   Yes [provider]  temazepam (RESTORIL) 15 MG capsule Take 15 mg by mouth at bedtime as needed for sleep.   Yes [provider]     ALLERGIES:  Allergies  Allergen Reactions  . Celecoxib Anaphylaxis    Swelling in throat.  . Sulfa Antibiotics Nausea And Vomiting and Swelling     SOCIAL HISTORY:  Social History   Socioeconomic History  . Marital status: Single    Spouse name: Not on file  . Number of children: Not on file  . Years of education: Not on file  . Highest education level: Not on file  Occupational History  . Not on file  Tobacco Use  . Smoking status: Former Smoker    Quit date: 09/25/2000    Years since quitting: 19.5  . Smokeless tobacco: Never Used  Vaping Use  . Vaping Use: Never used  Substance and Sexual Activity  . Alcohol use: No  . Drug use: No  . Sexual activity: Not on file  Other Topics Concern  . Not on file  Social History Narrative  . Not on file   Social Determinants of Health   Financial Resource Strain:   . Difficulty of Paying Living Expenses:   Food Insecurity:   . Worried About Charity fundraiser in the Last Year:   . Arboriculturist in the Last Year:   Transportation Needs:   . Film/video editor (Medical):   Marland Kitchen Lack of Transportation (Non-Medical):   Physical  Activity:   . Days of Exercise per Week:   . Minutes of Exercise per Session:   Stress:   . Feeling of Stress :   Social Connections:   . Frequency of Communication with Friends and Family:   . Frequency of Social Gatherings with Friends and Family:   . Attends Religious Services:   . Active Member of Clubs or Organizations:   . Attends Archivist Meetings:   Marland Kitchen Marital Status:   Intimate Partner Violence:   . Fear of Current or Ex-Partner:   . Emotionally Abused:   Marland Kitchen Physically Abused:   . Sexually Abused:      FAMILY HISTORY:  Family History  Problem Relation Age of Onset  . Heart attack Brother   .  Heart disease Brother     Otherwise negative.   REVIEW OF SYSTEMS:  Review of Systems  Constitutional: Negative for chills and fever.  HENT: Negative for congestion and sore throat.   Respiratory: Negative for cough and shortness of breath.   Cardiovascular: Negative for chest pain and palpitations.  Gastrointestinal: Positive for abdominal pain, nausea and vomiting. Negative for constipation and diarrhea.  Genitourinary: Negative for dysuria and urgency.  All other systems reviewed and are negative.   VITAL SIGNS:  Temp:  [89.4 F (31.9 C)-98.4 F (36.9 C)] 98.4 F (36.9 C) (06/30 1806) Pulse Rate:  [56-65] 56 (07/01 0718) Resp:  [14-25] 14 (07/01 0718) BP: (119-195)/(60-95) 120/73 (07/01 0718) SpO2:  [90 %-100 %] 94 % (07/01 0718) Weight:  [81.6 kg] 81.6 kg (06/30 1804)     Height: _0  (170.2 cm) Weight: 81.6 kg BMI (Calculated): 28.19   PHYSICAL EXAM:  Physical Exam Vitals and nursing note reviewed.  Constitutional:      General: He is not in acute distress.    Appearance: He is well-developed. He is obese. He is not ill-appearing.  Eyes:     General: No scleral icterus.    Extraocular Movements: Extraocular movements intact.  Cardiovascular:     Rate and Rhythm: Normal rate and regular rhythm.     Heart sounds: Normal heart sounds.  Pulmonary:      Effort: Pulmonary effort is normal. No respiratory distress.  Abdominal:     General: Abdomen is protuberant. There is no distension.     Palpations: Abdomen is soft.     Tenderness: There is abdominal tenderness in the right upper quadrant. There is no guarding or rebound. Negative signs include Murphy's sign.  Genitourinary:    Comments: Deferred Skin:    General: Skin is warm and dry.     Coloration: Skin is not jaundiced or pale.  Neurological:     General: No focal deficit present.     Mental Status: He is alert and oriented to person, place, and time.  Psychiatric:        Mood and Affect: Mood normal.        Behavior: Behavior normal.     INTAKE/OUTPUT:  This shift: No intake/output data recorded.  Last 2 shifts: _1 @  Labs:  CBC Latest Ref Rng & Units 03/25/2020 03/24/2020 10/05/2016  WBC 4.0 - 10.5 K/uL 17.7(H) 13.9(H) 6.4  Hemoglobin 13.0 - 17.0 g/dL 14.0 14.5 11.8(L)  Hematocrit 39 - 52 % 40.7 39.9 34.5(L)  Platelets 150 - 400 K/uL 217 234 224   CMP Latest Ref Rng & Units 03/25/2020 03/24/2020 11/23/2017  Glucose 70 - 99 mg/dL 215(H) 184(H) 158(H)  BUN 8 - 23 mg/dL _2 Creatinine 0.61 - 1.24 mg/dL 1.43(H) 1.31(H) 1.28(H)  Sodium 135 - 145 mmol/L 139 138 143  Potassium 3.5 - 5.1 mmol/L 3.3(L) 3.4(L) 4.1  Chloride 98 - 111 mmol/L 104 104 106  CO2 22 - 32 mmol/L _3 Calcium 8.9 - 10.3 mg/dL 9.0 9.7 8.9  Total Protein 6.5 - 8.1 g/dL 7.5 7.5 -  Total Bilirubin 0.3 - 1.2 mg/dL 2.0(H) 1.6(H) -  Alkaline Phos 38 - 126 U/L 91 91 -  AST 15 - 41 U/L 17 18 -  ALT 0 - 44 U/L 19 20 -     Imaging studies:   RUQ [place] (03/24/2020) personally reviewed showing cholelithiasis in the gallbladder neck, with some fluid around the gallbladder, and radiologist report reviewed:  IMPRESSION:  Sonographic features are concerning for an acute cholecystitis. Negative for biliary dilatation.   Assessment/Plan: (ICD-10's: K81.0) 74 y.o. male with acute cholecystitis,  complicated by pertinent comorbidities including fairly significant cardiovascular disease including MI and PCI x3.     - Admit to general surgery  - Given his significant cardiovascular comorbidities, we will plan for IR placement of percutaneous cholecystostomy tube and work on evaluation as an outpatient for interval cholecystectomy. Patient is in agreement with this.    - NPO for now; may consider clear liquid diet after procedure  - IVF Resuscitation  - IV ABx (Zosyn)   - Pain control prn; antiemetics prn  - Monitor abdominal examination  - Monitor CBC, BMP   - Medical management with home medications  - DVT prophylaxis; hold  All of the above findings and recommendations were discussed with the patient, and all of questions were answered to his expressed satisfaction.  -- Edison Simon, PA-C [facility] 03/25/2020, 7:45 AM [phone] M-F: 7am - 4pm

## 2020-03-25 NOTE — ED Notes (Signed)
Pt to CT for drain.

## 2020-03-25 NOTE — ED Notes (Signed)
Pt returned from procedure for perc drain. Abdomen soft. NAD. Pt resting comfortably at this time.  Unlabored. Wakes to voice.

## 2020-03-25 NOTE — ED Notes (Signed)
Dr Hampton Abbot messaged via secure chat for prn medications for HTN d/t SBP being greater than 180

## 2020-03-26 DIAGNOSIS — F32A Depression, unspecified: Secondary | ICD-10-CM | POA: Diagnosis present

## 2020-03-26 DIAGNOSIS — Z720 Tobacco use: Secondary | ICD-10-CM | POA: Diagnosis present

## 2020-03-26 DIAGNOSIS — I10 Essential (primary) hypertension: Secondary | ICD-10-CM

## 2020-03-26 DIAGNOSIS — E876 Hypokalemia: Secondary | ICD-10-CM

## 2020-03-26 DIAGNOSIS — N1831 Chronic kidney disease, stage 3a: Secondary | ICD-10-CM

## 2020-03-26 DIAGNOSIS — I2510 Atherosclerotic heart disease of native coronary artery without angina pectoris: Secondary | ICD-10-CM | POA: Diagnosis present

## 2020-03-26 DIAGNOSIS — K810 Acute cholecystitis: Principal | ICD-10-CM

## 2020-03-26 DIAGNOSIS — I5032 Chronic diastolic (congestive) heart failure: Secondary | ICD-10-CM

## 2020-03-26 DIAGNOSIS — E785 Hyperlipidemia, unspecified: Secondary | ICD-10-CM

## 2020-03-26 LAB — CBC
HCT: 35.2 % — ABNORMAL LOW (ref 39.0–52.0)
Hemoglobin: 12.5 g/dL — ABNORMAL LOW (ref 13.0–17.0)
MCH: 30 pg (ref 26.0–34.0)
MCHC: 35.5 g/dL (ref 30.0–36.0)
MCV: 84.4 fL (ref 80.0–100.0)
Platelets: 173 10*3/uL (ref 150–400)
RBC: 4.17 MIL/uL — ABNORMAL LOW (ref 4.22–5.81)
RDW: 13.3 % (ref 11.5–15.5)
WBC: 11.2 10*3/uL — ABNORMAL HIGH (ref 4.0–10.5)
nRBC: 0 % (ref 0.0–0.2)

## 2020-03-26 LAB — GLUCOSE, CAPILLARY
Glucose-Capillary: 150 mg/dL — ABNORMAL HIGH (ref 70–99)
Glucose-Capillary: 224 mg/dL — ABNORMAL HIGH (ref 70–99)
Glucose-Capillary: 96 mg/dL (ref 70–99)
Glucose-Capillary: 128 mg/dL — ABNORMAL HIGH (ref 70–99)

## 2020-03-26 LAB — COMPREHENSIVE METABOLIC PANEL
ALT: 16 U/L (ref 0–44)
AST: 15 U/L (ref 15–41)
Albumin: 3.1 g/dL — ABNORMAL LOW (ref 3.5–5.0)
Alkaline Phosphatase: 66 U/L (ref 38–126)
Anion gap: 10 (ref 5–15)
BUN: 18 mg/dL (ref 8–23)
CO2: 26 mmol/L (ref 22–32)
Calcium: 8.3 mg/dL — ABNORMAL LOW (ref 8.9–10.3)
Chloride: 104 mmol/L (ref 98–111)
Creatinine, Ser: 1.54 mg/dL — ABNORMAL HIGH (ref 0.61–1.24)
GFR calc Af Amer: 51 mL/min — ABNORMAL LOW (ref >60)
GFR calc non Af Amer: 44 mL/min — ABNORMAL LOW (ref >60)
Glucose, Bld: 135 mg/dL — ABNORMAL HIGH (ref 70–99)
Potassium: 3.2 mmol/L — ABNORMAL LOW (ref 3.5–5.1)
Sodium: 140 mmol/L (ref 135–145)
Total Bilirubin: 1.8 mg/dL — ABNORMAL HIGH (ref 0.3–1.2)
Total Protein: 6.2 g/dL — ABNORMAL LOW (ref 6.5–8.1)

## 2020-03-26 LAB — HEMOGLOBIN A1C
Hgb A1c MFr Bld: 8 % — ABNORMAL HIGH (ref 4.8–5.6)
Mean Plasma Glucose: 183 mg/dL

## 2020-03-26 LAB — BRAIN NATRIURETIC PEPTIDE: B Natriuretic Peptide: 83.7 pg/mL (ref 0.0–100.0)

## 2020-03-26 LAB — MAGNESIUM: Magnesium: 2.2 mg/dL (ref 1.7–2.4)

## 2020-03-26 MED ORDER — HEPARIN SODIUM (PORCINE) 5000 UNIT/ML IJ SOLN
5000.0000 [IU] | Freq: Three times a day (TID) | INTRAMUSCULAR | Status: DC
  Administered 2020-03-27 – 2020-03-28 (×4): 5000 [IU] via SUBCUTANEOUS
  Filled 2020-03-26 (×5): qty 1

## 2020-03-26 MED ORDER — SODIUM CHLORIDE 0.9 % IV SOLN: INTRAVENOUS | Status: DC

## 2020-03-26 MED ORDER — POTASSIUM CHLORIDE CRYS ER 20 MEQ PO TBCR
40.0000 meq | EXTENDED_RELEASE_TABLET | Freq: Once | ORAL | Status: AC
  Administered 2020-03-26: 40 meq via ORAL
  Filled 2020-03-26: qty 2

## 2020-03-26 MED ORDER — NICOTINE 21 MG/24HR TD PT24
21.0000 mg | MEDICATED_PATCH | Freq: Every day | TRANSDERMAL | Status: DC
  Filled 2020-03-26 (×2): qty 1

## 2020-03-26 NOTE — Progress Notes (Signed)
[facility] PROGRESS NOTE (cpt [phone])  Hospital Day(s): 1.  Interval History: Patient seen and examined, no acute events or new complaints overnight. Patient reports his abdominal pain is signficiantly improved, denies feer, chills, nausea, or emesis. Leukocytosis with significant improvement this morning to 11.2K. Hyperbilirubinemia improved as well down to 1.8. Renal function is up some from his baseline with sCr - 1.54 (baseline appears around 1.2 - 1.4). he does have mild hypokalemia this morning to 3.2. S/P percutaneous cholecystostomy tube placement yesterday, 100 ccs out, bilious. Tolerated CLD after his procedure. No other complaints.   Review of Systems:  Constitutional: denies fever, chills  HEENT: denies cough or congestion  Respiratory: denies any shortness of breath  Cardiovascular: denies chest pain or palpitations  Gastrointestinal: denies abdominal pain, N/V, or diarrhea/and bowel function as per interval history Genitourinary: denies burning with urination or urinary frequency Musculoskeletal: denies pain, decreased motor or sensation  Vital signs in last 24 hours: [min-max] current  Temp:  [97.7 F (36.5 C)-98.4 F (36.9 C)] 97.7 F (36.5 C) (07/02 0516) Pulse Rate:  [55-74] 60 (07/02 0516) Resp:  [13-20] 16 (07/02 0516) BP: (118-151)/(56-77) 139/65 (07/02 0516) SpO2:  [93 %-99 %] 97 % (07/02 0516)     Height: 5\' 7"  (170.2 cm) Weight: 81.6 kg BMI (Calculated): 28.19   Intake/Output last 2 shifts:  07/01 0701 - 07/02 0700 In: 542.6 [I.V.:517.1; IV Piggyback:25.5] Out: 950 [Urine:850]   Physical Exam:  Constitutional: alert, cooperative and no distress  HENT: normocephalic without obvious abnormality  Eyes: PERRL, EOM's grossly intact and symmetric  Respiratory: breathing non-labored at rest  Cardiovascular: regular rate and sinus rhythm  Gastrointestinal: soft, non-tender, and non-distended, No rebound/guarding. Cholecystostomy tube in  RUQ with bloody-bilious drainage Musculoskeletal: no edema or wounds, motor and sensation grossly intact, NT    Labs:  CBC Latest Ref Rng & Units 03/26/2020 03/25/2020 03/24/2020  WBC 4.0 - 10.5 K/uL 11.2(H) 17.7(H) 13.9(H)  Hemoglobin 13.0 - 17.0 g/dL 12.5(L) 14.0 14.5  Hematocrit 39 - 52 % 35.2(L) 40.7 39.9  Platelets 150 - 400 K/uL 173 217 234   CMP Latest Ref Rng & Units 03/26/2020 03/25/2020 03/24/2020  Glucose 70 - 99 mg/dL 135(H) 215(H) 184(H)  BUN 8 - 23 mg/dL 18 14 15   Creatinine 0.61 - 1.24 mg/dL 1.54(H) 1.43(H) 1.31(H)  Sodium 135 - 145 mmol/L 140 139 138  Potassium 3.5 - 5.1 mmol/L 3.2(L) 3.3(L) 3.4(L)  Chloride 98 - 111 mmol/L 104 104 104  CO2 22 - 32 mmol/L 26 22 23   Calcium 8.9 - 10.3 mg/dL 8.3(L) 9.0 9.7  Total Protein 6.5 - 8.1 g/dL 6.2(L) 7.5 7.5  Total Bilirubin 0.3 - 1.2 mg/dL 1.8(H) 2.0(H) 1.6(H)  Alkaline Phos 38 - 126 U/L 66 91 91  AST 15 - 41 U/L 15 17 18   ALT 0 - 44 U/L 16 19 20     Imaging studies: No new pertinent imaging studies   Assessment/Plan: (ICD-10's: K81.0) 74 y.o. male with improvement in abdominal pain, leukocytosis, and hyperbilirubinemia s/p percutaneous cholecystostomy on 07/01 for acute cholecystitis, complicated by pertinent comorbidities including fairly significant cardiovascular disease including MI and PCI x3.   - Full liquid diet; ADAT  - IVF Resuscitation; Switch to NS given renal function             - IV ABx (Zosyn); PO for home             - Pain control prn; antiemetics prn             -  Monitor abdominal examination             - Monitor CBC, BMP   - replete K+' monitor             - Medical management with home medications; we will ask medicine for assistance with his comorbidities  All of the above findings and recommendations were discussed with the patient, and the medical team, and all of patient's questions were answered to his expressed satisfaction.  -- Edison Simon, PA-C [facility] 03/26/2020, 7:16  AM [phone] M-F: 7am - 4pm

## 2020-03-26 NOTE — Consult Note (Signed)
Medical Consultation   YIFAN AUKER  WNI:[id number]  DOB: 06/04/1946  DOA: 03/24/2020  PCP: [name], MD   Outpatient Specialists:   Requesting physician: PA, Suella Broad, surgery  Reason for consultation: -to manage multiple chronic medical issues    History of Present Illness: KRITHIK MAPEL is an 74 y.o. male hypertension, hyperlipidemia, diabetes mellitus, depression, CAD, myocardial infarction, DES stent placement, tobacco abuse, dCHF, CKD stage III, who was admitted due to cholecystitis by general surgery team.  Patient is day 1 s/pfof Perc Cole drainage. We are asked to consult due to multiple chronic medical issues.  Pt underwent perc chole drain yesterday. He states that he has minimal pain in the surgical site, but no nausea, vomiting, diarrhea.  No fever or chills.  Patient denies chest pain, shortness breath, cough.  No symptoms of UTI or unilateral weakness.  He states that he is feeling much better today.  Today lab: BNP 83, potassium 3.2, slightly increased creatinine (1.43 -->1.54, baseline creatinine 1.2-1.5).  Temperature normal, blood pressure 139/65, heart rate 60, RR 16, oxygen saturation 97% on room air.  Review of Systems:  General: no fevers, chills, no changes in body weight, no changes in appetite Skin: no rash HEENT: no blurry vision, hearing changes or sore throat Pulm: no dyspnea, coughing, wheezing CV: no chest pain, palpitations, shortness of breath Abd: no nausea/vomiting, diarrhea/constipation. Has mild pain in surgical site GU: no dysuria, hematuria, polyuria Ext: no arthralgias, myalgias Neuro: no weakness, numbness, or tingling   Past Medical History: Past Medical History:  Diagnosis Date  . Arthritis   . Coronary artery disease    a. STEMI 03/2016 DESx1 to LCx, DES x 1 Mid LAD, DES x1 prox RCA  . Depression   . Diabetes mellitus without complication ([place])    type 2  . History of acute inferior wall  myocardial infarction 04/15/2016   inf-lat/post >> PCI with DES of LCx; staged PCI of LAD and RCA  . Hypercholesteremia   . Hypertension   . Ischemic cardiomyopathy 04/19/2016   A. Inf-lat/post STEMI 7/17 >> b. Echo 04/17/16: Septal and post lateral HK, poor image quality, EF 35-40% // b. Echo 11/17: mild LVH, EF 50-55, inf-lat and ant-lat HK, Gr 1 DD, borderline dilated aortic root (37 mm), MAC  . Leg pain    ABIs 3/19:  normal    Past Surgical History: Past Surgical History:  Procedure Laterality Date  . CARDIAC CATHETERIZATION N/A 04/15/2016   Procedure: Left Heart Cath and Coronary Angiography;  Surgeon: Sherren Mocha, MD;  Location: [facility];  Service: Cardiovascular;  Laterality: N/A;  . CARDIAC CATHETERIZATION N/A 04/15/2016   Procedure: Coronary Stent Intervention;  Surgeon: Sherren Mocha, MD;  Location: [facility];  Service: Cardiovascular;  Laterality: N/A;  . CARDIAC CATHETERIZATION N/A 04/17/2016   Procedure: Coronary Stent Intervention;  Surgeon: Burnell Blanks, MD;  Location: [facility];  Service: Cardiovascular;  Laterality: N/A;  . CARDIAC CATHETERIZATION N/A 04/18/2016   Procedure: Coronary Stent Intervention;  Surgeon: Burnell Blanks, MD;  Location: [facility];  Service: Cardiovascular;  Laterality: N/A;  . CARDIAC CATHETERIZATION N/A 04/18/2016   Procedure: Temporary Pacemaker;  Surgeon: Burnell Blanks, MD;  Location: [facility];  Service: Cardiovascular;  Laterality: N/A;  . CORONARY STENT PLACEMENT  04/17/2016    Severe stenosis proximal LAD, now s/p successful PTCA/DES  x 1 proximal and mid LAD  . COSMETIC SURGERY  1974   right side facial       . SHOULDER SURGERY       Allergies:   Allergies  Allergen Reactions  . Celecoxib Anaphylaxis    Swelling in throat.  . Sulfa Antibiotics Nausea And Vomiting and Swelling     Social History:  reports that he has been smoking. He has never used smokeless  tobacco. He reports that he does not drink alcohol and does not use drugs.   Family History: Family History  Problem Relation Age of Onset  . Heart attack Brother   . Heart disease Brother    Physical Exam: Vitals:   03/25/20 1325 03/25/20 2022 03/26/20 0516 03/26/20 1229  BP: 126/70 127/67 139/65 (!) 155/71  Pulse: 65 61 60 68  Resp: _0 Temp: 98.1 F (36.7 C) 98.4 F (36.9 C) 97.7 F (36.5 C) 98 F (36.7 C)  TempSrc: Oral Oral Oral Oral  SpO2: 97% 97% 97% 99%  Weight:      Height:        General: Not in acute distress.  Dry mucous membrane HEENT: PERRL, EOMI, no scleral icterus, No JVD or bruit Cardiac: S1/S2, RRR, No murmurs, gallops or rubs Pulm:  Clear to auscultation bilaterally. No rales, wheezing, rhonchi or rubs. Abd: Soft, nondistended, no rebound pain, no organomegaly, BS present. Cholecystostomy tube in RUQ with bloody-bilious drainage Ext: No edema. 1+DP/PT pulse bilaterally Musculoskeletal: No joint deformities, erythema, or stiffness, ROM full Skin: No rashes.  Neuro: Alert and oriented X3, cranial nerves II-XII grossly intact, muscle strength 5/5 in all extremeties Psych: Patient is not psychotic, no suicidal or hemocidal ideation.   Data reviewed:  I have personally reviewed following labs and imaging studies Labs:  CBC: Recent Labs  Lab 03/24/20 1820 03/25/20 0103 03/26/20 0520  WBC 13.9* 17.7* 11.2*  HGB 14.5 14.0 12.5*  HCT 39.9 40.7 35.2*  MCV 81.3 85.0 84.4  PLT 234 217 628    Basic Metabolic Panel: Recent Labs  Lab 03/24/20 1820 03/24/20 1820 03/25/20 0103 03/26/20 0520  NA 138  --  139 140  K 3.4*   < > 3.3* 3.2*  CL 104  --  104 104  CO2 23  --  22 26  GLUCOSE 184*  --  215* 135*  BUN 15  --  14 18  CREATININE 1.31*  --  1.43* 1.54*  CALCIUM 9.7  --  9.0 8.3*  MG  --   --  2.0 2.2   < > = values in this interval not displayed.   GFR Estimated Creatinine Clearance: 43 mL/min (A) (by C-G formula based on SCr of  1.54 mg/dL (H)). Liver Function Tests: Recent Labs  Lab 03/24/20 1820 03/25/20 0103 03/26/20 0520  AST _1 ALT _2 ALKPHOS 91 91 66  BILITOT 1.6* 2.0* 1.8*  PROT 7.5 7.5 6.2*  ALBUMIN 3.9 3.9 3.1*   Recent Labs  Lab 03/24/20 1820  LIPASE 34   No results for input(s): AMMONIA in the last 168 hours. Coagulation profile Recent Labs  Lab 03/25/20 0824  INR 1.0    Cardiac Enzymes: No results for input(s): CKTOTAL, CKMB, CKMBINDEX, TROPONINI in the last 168 hours. BNP: Invalid input(s): POCBNP CBG: Recent Labs  Lab 03/25/20 1139 03/25/20 1646 03/25/20 2034 03/26/20 0757 03/26/20 1137  GLUCAP 157* 246* 97 150* 224*   D-Dimer No results for input(s): DDIMER  in the last 72 hours. Hgb A1c Recent Labs    03/25/20 0103  HGBA1C 8.0*   Lipid Profile No results for input(s): CHOL, HDL, LDLCALC, TRIG, CHOLHDL, LDLDIRECT in the last 72 hours. Thyroid function studies No results for input(s): TSH, T4TOTAL, T3FREE, THYROIDAB in the last 72 hours.  Invalid input(s): FREET3 Anemia work up No results for input(s): VITAMINB12, FOLATE, FERRITIN, TIBC, IRON, RETICCTPCT in the last 72 hours. Urinalysis    Component Value Date/Time   COLORURINE YELLOW 10/03/2016 1635   APPEARANCEUR CLEAR 10/03/2016 1635   LABSPEC 1.006 10/03/2016 1635   PHURINE 6.0 10/03/2016 1635   GLUCOSEU NEGATIVE 10/03/2016 1635   HGBUR NEGATIVE 10/03/2016 1635   BILIRUBINUR NEGATIVE 10/03/2016 1635   KETONESUR NEGATIVE 10/03/2016 1635   PROTEINUR NEGATIVE 10/03/2016 1635   NITRITE NEGATIVE 10/03/2016 1635   LEUKOCYTESUR NEGATIVE 10/03/2016 1635     Microbiology Recent Results (from the past 240 hour(s))  SARS Coronavirus 2 by RT PCR (hospital order, performed in [facility] hospital lab) Nasopharyngeal Nasopharyngeal Swab     Status: None   Collection Time: 03/24/20 11:35 PM   Specimen: Nasopharyngeal Swab  Result Value Ref Range Status   SARS Coronavirus 2 NEGATIVE NEGATIVE Final     Comment: (NOTE) SARS-CoV-2 target nucleic acids are NOT DETECTED.  The SARS-CoV-2 RNA is generally detectable in upper and lower respiratory specimens during the acute phase of infection. The lowest concentration of SARS-CoV-2 viral copies this assay can detect is 250 copies / mL. A negative result does not preclude SARS-CoV-2 infection and should not be used as the sole basis for treatment or other patient management decisions.  A negative result may occur with improper specimen collection / handling, submission of specimen other than nasopharyngeal swab, presence of viral mutation(s) within the areas targeted by this assay, and inadequate number of viral copies (<250 copies / mL). A negative result must be combined with clinical observations, patient history, and epidemiological information.  Fact Sheet for Patients:   StrictlyIdeas.no  Fact Sheet for Healthcare Providers: BankingDealers.co.za  This test is not yet approved or  cleared by the [place] FDA and has been authorized for detection and/or diagnosis of SARS-CoV-2 by FDA under an Emergency Use Authorization (EUA).  This EUA will remain in effect (meaning this test can be used) for the duration of the COVID-19 declaration under Section 564(b)(1) of the Act, 21 U.S.C. section 360bbb-3(b)(1), unless the authorization is terminated or revoked sooner.  Performed at [facility], [address] [place], [place] [address]   Body fluid culture     Status: None (Preliminary result)   Collection Time: 03/25/20 11:19 AM   Specimen: BILE; Body Fluid  Result Value Ref Range Status   Specimen Description   Final    BILE Performed at [facility], [address] [place], [place] [address]    Special Requests   Final    NONE Performed at [facility], [place], [place] [address]    Gram Stain NO WBC SEEN NO ORGANISMS SEEN    Final   Culture   Final    NO GROWTH < 24 HOURS Performed at [facility], [place] [address], [place], [place] [address]    Report Status PENDING  Incomplete       Inpatient Medications:   Scheduled Meds: . acetaminophen  1,000 mg Oral Q6H  . amLODipine  10 mg Oral Daily  . atorvastatin  80 mg Oral q1800  .  carvedilol  25 mg Oral BID  . insulin aspart  0-15 Units Subcutaneous TID WC  . nicotine  21 mg Transdermal Daily  . pantoprazole (PROTONIX) IV  40 mg Intravenous QHS  . ramipril  5 mg Oral BID  . sertraline  50 mg Oral Daily  . sodium chloride flush  5 mL Intracatheter Q8H   Continuous Infusions: . sodium chloride 100 mL/hr at 03/26/20 0845  . piperacillin-tazobactam (ZOSYN)  IV 3.375 g (03/26/20 0846)     Radiological Exams on Admission: DG Chest 2 View  Result Date: 03/24/2020 CLINICAL DATA:  Epigastric pain EXAM: CHEST - 2 VIEW COMPARISON:  08/15/2016 FINDINGS: Linear densities in the lung bases compatible with atelectasis or scarring. Heart is normal size. No confluent opacities or effusions. No acute bony abnormality. IMPRESSION: Bibasilar scarring or atelectasis.  No active disease. Electronically Signed   By: Rolm Baptise M.D.   On: 03/24/2020 18:44   CT PERC CHOLECYSTOSTOMY  Result Date: 03/25/2020 INDICATION: Gallstones/cholecystitis. EXAM: Percutaneous cholecystostomy. Category: Biliary Interventions Subcategory: Biliary Drain - Placement Follow-Up: As per patient's physician. MEDICATIONS: None. ANESTHESIA/SEDATION: Moderate (conscious) sedation was employed during this procedure. A total of Versed 3 mg and Fentanyl 50 mcg was administered intravenously. Moderate Sedation Time: Approximately 20 minutes. The patient's level of consciousness and vital signs were monitored continuously by radiology nursing throughout the procedure under my direct supervision. PROCEDURE: After discussing the risks and benefits of this procedure in, informed consent was obtained.  Abdomen was sterilely prepped and draped. Following localization of the gallbladder with CT and local anesthesia with 1% lidocaine, a 18 gauge needle was advanced into the gallbladder under CT guidance. A 035 Amplatz wire was placed. This is followed by placement of a 8.5 French drainage catheter. Approximately 100 cc of dark green bile removed. Catheter sutured in place and placed to bag drainage. Bile sent to the lab for microbiologic studies. FLUOROSCOPY TIME:  None. COMPLICATIONS: None immediate. FINDINGS: Successful percutaneous cholecystostomy as described above. IMPRESSION: Successful percutaneous cholecystostomy. Electronically Signed   By: Marcello Moores  Register   On: 03/25/2020 11:50   US Abdomen Limited RUQ  Result Date: 03/24/2020 CLINICAL DATA:  Right upper quadrant pain EXAM: ULTRASOUND ABDOMEN LIMITED RIGHT UPPER QUADRANT COMPARISON:  None. FINDINGS: Gallbladder: Shadowing stone at the gallbladder neck measuring 17 mm. Increased wall thickness of 6.3 mm with small pericholecystic fluid and positive sonographic Murphy. Common bile duct: Diameter: 2.5 mm Liver: No focal lesion identified. Within normal limits in parenchymal echogenicity. Portal vein is patent on color Doppler imaging with normal direction of blood flow towards the liver. Other: None. IMPRESSION: Sonographic features are concerning for an acute cholecystitis. Negative for biliary dilatation. Electronically Signed   By: Donavan Foil M.D.   On: 03/24/2020 20:27    Impression/Recommendations Principal Problem:   Acute cholecystitis Active Problems:   Hypokalemia   Type II diabetes mellitus with renal manifestations (HCC)   CKD (chronic kidney disease), stage IIIa   HLD (hyperlipidemia)   Essential hypertension   Chronic diastolic CHF (congestive heart failure) (HCC)   Coronary artery disease   Depression   Tobacco abuse   Acute cholecystitis: s/p of perc chole drain yesterday. Pt does not have nausea, vomiting or diarrhea.   No fever or chills.  Has mild pain at surgical site.  -Patient is on Zosyn -Pain management per primary surgery team  Hypokalemia: K= 3.2 on admission. - Repleted - Check Mg level  Type II diabetes mellitus with renal manifestations ([place]): Most recent A1c  8.0, poorly controled. Patient is taking Amaryl at home -SSI  CKD (chronic kidney disease), stage IIIa: Creatinine slightly worsened than yesterday. Baseline creatinine is 1.2-1.5.  Her creatinine is 1.54, BUN 18.  Renal function is close to baseline. -Hold Lasix since patient is clinically dry on examination -Follow-up renal function by BMP   HLD (hyperlipidemia) -Lipitor  Essential hypertension -Amlodipine, Coreg, ramipril -IV hydralazine as needed  Chronic diastolic CHF (congestive heart failure) ([place]): 2D echo on 10/04/2016 showed EF of 50-55% with grade 1 diastolic dysfunction.  Patient does not have leg edema JVD.  Patient is dry on examination. -Hold Lasix -Check BNP --> 83  Coronary artery disease: S/p of DES.  No chest pain -Continue aspirin, Coreg, Lipitor  Depression: Stable, no suicidal or homicidal ideations. -Continue home medications  Tobacco abuse -Nicotine patch   Thank you for this consultation.  Our [facility] hospitalist team will follow the patient with you.   Time Spent:  30 min  Ivor Costa M.D. [facility] 03/26/2020, 12:30 PM

## 2020-03-27 DIAGNOSIS — E1121 Type 2 diabetes mellitus with diabetic nephropathy: Secondary | ICD-10-CM

## 2020-03-27 LAB — BASIC METABOLIC PANEL
Anion gap: 7 (ref 5–15)
BUN: 14 mg/dL (ref 8–23)
CO2: 25 mmol/L (ref 22–32)
Calcium: 7.8 mg/dL — ABNORMAL LOW (ref 8.9–10.3)
Chloride: 107 mmol/L (ref 98–111)
Creatinine, Ser: 1.37 mg/dL — ABNORMAL HIGH (ref 0.61–1.24)
GFR calc Af Amer: 58 mL/min — ABNORMAL LOW (ref >60)
GFR calc non Af Amer: 50 mL/min — ABNORMAL LOW (ref >60)
Glucose, Bld: 161 mg/dL — ABNORMAL HIGH (ref 70–99)
Potassium: 3.5 mmol/L (ref 3.5–5.1)
Sodium: 139 mmol/L (ref 135–145)

## 2020-03-27 LAB — CBC
HCT: 33.1 % — ABNORMAL LOW (ref 39.0–52.0)
Hemoglobin: 11.5 g/dL — ABNORMAL LOW (ref 13.0–17.0)
MCH: 29.5 pg (ref 26.0–34.0)
MCHC: 34.7 g/dL (ref 30.0–36.0)
MCV: 84.9 fL (ref 80.0–100.0)
Platelets: 180 10*3/uL (ref 150–400)
RBC: 3.9 MIL/uL — ABNORMAL LOW (ref 4.22–5.81)
RDW: 13.3 % (ref 11.5–15.5)
WBC: 7.9 10*3/uL (ref 4.0–10.5)
nRBC: 0 % (ref 0.0–0.2)

## 2020-03-27 LAB — GLUCOSE, CAPILLARY
Glucose-Capillary: 151 mg/dL — ABNORMAL HIGH (ref 70–99)
Glucose-Capillary: 158 mg/dL — ABNORMAL HIGH (ref 70–99)
Glucose-Capillary: 76 mg/dL (ref 70–99)

## 2020-03-27 MED ORDER — PIPERACILLIN-TAZOBACTAM 3.375 G IVPB
3.3750 g | Freq: Three times a day (TID) | INTRAVENOUS | Status: DC
  Administered 2020-03-27 – 2020-03-28 (×3): 3.375 g via INTRAVENOUS
  Filled 2020-03-27 (×2): qty 50

## 2020-03-27 MED ORDER — SODIUM CHLORIDE 0.9 % IV SOLN
INTRAVENOUS | Status: DC | PRN
  Administered 2020-03-27: 250 mL via INTRAVENOUS

## 2020-03-27 MED ORDER — POTASSIUM CHLORIDE 20 MEQ PO PACK
40.0000 meq | PACK | Freq: Once | ORAL | Status: AC
  Administered 2020-03-27: 40 meq via ORAL
  Filled 2020-03-27: qty 2

## 2020-03-27 NOTE — Progress Notes (Signed)
CC: Cholecystitis Subjective: Doing well.  Walked to the bathroom.  60 cc from drain.  No fevers no chills.  I personally reviewed the ultrasound and CT scan consistent with cholecystitis.  Objective: Vital signs in last 24 hours: Temp:  [97.4 F (36.3 C)-98.3 F (36.8 C)] 98 F (36.7 C) (07/03 1140) Pulse Rate:  [62-73] 62 (07/03 1140) Resp:  [14-20] 14 (07/03 1140) BP: (145-157)/(72-96) 157/96 (07/03 1140) SpO2:  [97 %-100 %] 100 % (07/03 1140) Last BM Date: 03/27/20  Intake/Output from previous day: 07/02 0701 - 07/03 0700 In: 2311.4 [I.V.:2111.1; IV Piggyback:200.3] Out: 1160 [Urine:950; Drains:210] Intake/Output this shift: Total I/O In: 742.8 [P.O.:360; I.V.:359.9; IV Piggyback:22.9] Out: 0   Physical exam: NAD Chest CTA, s1,s2 Abd: soft, nt, drain in place serosanguinous fluid. No peritonitis. Ext; no edema  Lab Results: CBC  Recent Labs    03/26/20 0520 03/27/20 0707  WBC 11.2* 7.9  HGB 12.5* 11.5*  HCT 35.2* 33.1*  PLT 173 180   BMET Recent Labs    03/26/20 0520 03/27/20 0707  NA 140 139  K 3.2* 3.5  CL 104 107  CO2 26 25  GLUCOSE 135* 161*  BUN 18 14  CREATININE 1.54* 1.37*  CALCIUM 8.3* 7.8*   PT/INR Recent Labs    03/25/20 0824  LABPROT 12.9  INR 1.0   ABG No results for input(s): PHART, HCO3 in the last 72 hours.  Invalid input(s): PCO2, PO2  Studies/Results: No results found.  Anti-infectives: Anti-infectives (From admission, onward)    Start     Dose/Rate Route Frequency Ordered Stop   03/25/20 0800  piperacillin-tazobactam (ZOSYN) IVPB 3.375 g     Discontinue     3.375 g 12.5 mL/hr over 240 Minutes Intravenous Every 8 hours 03/25/20 0121     03/25/20 0030  piperacillin-tazobactam (ZOSYN) IVPB 3.375 g  Status:  Discontinued        3.375 g 12.5 mL/hr over 240 Minutes Intravenous Every 8 hours 03/25/20 0019 03/25/20 0121   03/24/20 2345  piperacillin-tazobactam (ZOSYN) IVPB 3.375 g        3.375 g 100 mL/hr over 30 Minutes  Intravenous  Once 03/24/20 2334 03/25/20 0059       Assessment/Plan: Cholecystitis status post cholecystostomy tube.  Improving.  We will switch to p.o. antibiotics.  Mobilize patient today.  The patient lives alone and I do think he will benefit from 1 more day of antibiotic therapy . We will also make sure that he is safe to go home and that he is able to walk around the halls.  We will advance the diet and discharge him in the morning. He understands that he will have to wear the cholecystostomy tube for at least 6 weeks and and surgical intervention will is in his best interest after waiting for least 2 months. His note that I have spent more than 35 minutes in this encounter with greater than 50% spent in coordination counseling of his care  Caroleen Hamman, MD, [facility]  03/27/2020

## 2020-03-27 NOTE — Progress Notes (Signed)
PROGRESS NOTE    Gilbert Obrien  ERQ:[id number] DOB: 1946/05/01 DOA: 03/24/2020 PCP: [name], MD     Brief Narrative:   Gilbert Obrien is an 74 y.o. male hypertension, hyperlipidemia, diabetes mellitus, depression, CAD, myocardial infarction, DES stent placement, tobacco abuse, dCHF, CKD stage III, who was admitted due to cholecystitis by general surgery team.  Patient is s/p cholecystostomy drainage. We are asked to consult due to multiple chronic medical issues.  Assessment & Plan:   Principal Problem:   Acute cholecystitis Active Problems:   Hypokalemia   Type II diabetes mellitus with renal manifestations (HCC)   CKD (chronic kidney disease), stage IIIa   HLD (hyperlipidemia)   Essential hypertension   Chronic diastolic CHF (congestive heart failure) (HCC)   Coronary artery disease   Depression   Tobacco abuse  #1.  Hypokalemia. Potassium still borderline, give another oral dose of supplement.  2.  Type 2 diabetes with chronic kidney disease stage IIIa. Condition stable.  3.  Essential hypertension. Continue home medicines.  4.  Chronic diastolic congestive heart failure. No evidence of exacerbation.  5.  Acute cholecystitis status post cholecystostomy.     DVT prophylaxis: Heparin Code Status: Full Family Communication: None Disposition Plan:  . Patient came from:            . Anticipated d/c place: . Barriers to d/c OR conditions which need to be met to effect a safe d/c:    Procedures: Cholecystostomy Antimicrobials: Zosyn  Subjective: Patient doing very well.  No abdominal pain no nausea vomiting today. Denies any short of breath or cough. No fever or chills.  Objective: Vitals:   03/26/20 1229 03/26/20 2111 03/27/20 0439 03/27/20 0814  BP: (!) 155/71 (!) 153/72 (!) 145/90 (!) 153/79  Pulse: 68 73 65 62  Resp: _0 Temp: 98 F (36.7 C) 98.3 F (36.8 C) (!) 97.4 F (36.3 C)   TempSrc: Oral Oral Oral   SpO2: 99% 98%  97%   Weight:      Height:        Intake/Output Summary (Last 24 hours) at 03/27/2020 1044 Last data filed at 03/27/2020 1000 Gross per 24 hour  Intake 3054.22 ml  Output 860 ml  Net 2194.22 ml   Filed Weights   03/24/20 1804  Weight: 81.6 kg    Examination:  General exam: Appears calm and comfortable  Respiratory system: Clear to auscultation. Respiratory effort normal. Cardiovascular system: S1 & S2 heard, RRR. No JVD, murmurs, rubs, gallops or clicks. No pedal edema. Gastrointestinal system: Abdomen is nondistended, soft and nontender. No organomegaly or masses felt. Normal bowel sounds heard. Central nervous system: Alert and oriented. No focal neurological deficits. Extremities: Symmetric 5 x 5 power. Skin: No rashes, lesions or ulcers Psychiatry: Judgement and insight appear normal. Mood & affect appropriate.     Data Reviewed: I have personally reviewed following labs and imaging studies  CBC: Recent Labs  Lab 03/24/20 1820 03/25/20 0103 03/26/20 0520 03/27/20 0707  WBC 13.9* 17.7* 11.2* 7.9  HGB 14.5 14.0 12.5* 11.5*  HCT 39.9 40.7 35.2* 33.1*  MCV 81.3 85.0 84.4 84.9  PLT 234 217 173 887   Basic Metabolic Panel: Recent Labs  Lab 03/24/20 1820 03/25/20 0103 03/26/20 0520 03/27/20 0707  NA 138 139 140 139  K 3.4* 3.3* 3.2* 3.5  CL 104 104 104 107  CO2 _1 GLUCOSE 184* 215* 135* 161*  BUN _2 14  CREATININE 1.31* 1.43* 1.54* 1.37*  CALCIUM 9.7 9.0 8.3* 7.8*  MG  --  2.0 2.2  --    GFR: Estimated Creatinine Clearance: 48.4 mL/min (A) (by C-G formula based on SCr of 1.37 mg/dL (H)). Liver Function Tests: Recent Labs  Lab 03/24/20 1820 03/25/20 0103 03/26/20 0520  AST _0 ALT _1 ALKPHOS 91 91 66  BILITOT 1.6* 2.0* 1.8*  PROT 7.5 7.5 6.2*  ALBUMIN 3.9 3.9 3.1*   Recent Labs  Lab 03/24/20 1820  LIPASE 34   No results for input(s): AMMONIA in the last 168 hours. Coagulation Profile: Recent Labs  Lab  03/25/20 0824  INR 1.0   Cardiac Enzymes: No results for input(s): CKTOTAL, CKMB, CKMBINDEX, TROPONINI in the last 168 hours. BNP (last 3 results) No results for input(s): PROBNP in the last 8760 hours. HbA1C: Recent Labs    03/25/20 0103  HGBA1C 8.0*   CBG: Recent Labs  Lab 03/26/20 0757 03/26/20 1137 03/26/20 1734 03/26/20 2129 03/27/20 0749  GLUCAP 150* 224* 96 128* 151*   Lipid Profile: No results for input(s): CHOL, HDL, LDLCALC, TRIG, CHOLHDL, LDLDIRECT in the last 72 hours. Thyroid Function Tests: No results for input(s): TSH, T4TOTAL, FREET4, T3FREE, THYROIDAB in the last 72 hours. Anemia Panel: No results for input(s): VITAMINB12, FOLATE, FERRITIN, TIBC, IRON, RETICCTPCT in the last 72 hours. Sepsis Labs: No results for input(s): PROCALCITON, LATICACIDVEN in the last 168 hours.  Recent Results (from the past 240 hour(s))  SARS Coronavirus 2 by RT PCR (hospital order, performed in [facility] hospital lab) Nasopharyngeal Nasopharyngeal Swab     Status: None   Collection Time: 03/24/20 11:35 PM   Specimen: Nasopharyngeal Swab  Result Value Ref Range Status   SARS Coronavirus 2 NEGATIVE NEGATIVE Final    Comment: (NOTE) SARS-CoV-2 target nucleic acids are NOT DETECTED.  The SARS-CoV-2 RNA is generally detectable in upper and lower respiratory specimens during the acute phase of infection. The lowest concentration of SARS-CoV-2 viral copies this assay can detect is 250 copies / mL. A negative result does not preclude SARS-CoV-2 infection and should not be used as the sole basis for treatment or other patient management decisions.  A negative result may occur with improper specimen collection / handling, submission of specimen other than nasopharyngeal swab, presence of viral mutation(s) within the areas targeted by this assay, and inadequate number of viral copies (<250 copies / mL). A negative result must be combined with clinical observations, patient  history, and epidemiological information.  Fact Sheet for Patients:   StrictlyIdeas.no  Fact Sheet for Healthcare Providers: BankingDealers.co.za  This test is not yet approved or  cleared by the [place] FDA and has been authorized for detection and/or diagnosis of SARS-CoV-2 by FDA under an Emergency Use Authorization (EUA).  This EUA will remain in effect (meaning this test can be used) for the duration of the COVID-19 declaration under Section 564(b)(1) of the Act, 21 U.S.C. section 360bbb-3(b)(1), unless the authorization is terminated or revoked sooner.  Performed at [facility], [address] [place], [place] [address]   Body fluid culture     Status: None (Preliminary result)   Collection Time: 03/25/20 11:19 AM   Specimen: BILE; Body Fluid  Result Value Ref Range Status   Specimen Description   Final    BILE Performed at [facility], [address], [place],  [address]    Special Requests   Final    NONE  Performed at [facility], [place], [place] [address]    Gram Stain NO WBC SEEN NO ORGANISMS SEEN   Final   Culture   Final    NO GROWTH 2 DAYS Performed at [facility], [place] [address] [place], [place] [address]    Report Status PENDING  Incomplete         Radiology Studies: CT PERC CHOLECYSTOSTOMY  Result Date: 03/25/2020 INDICATION: Gallstones/cholecystitis. EXAM: Percutaneous cholecystostomy. Category: Biliary Interventions Subcategory: Biliary Drain - Placement Follow-Up: As per patient's physician. MEDICATIONS: None. ANESTHESIA/SEDATION: Moderate (conscious) sedation was employed during this procedure. A total of Versed 3 mg and Fentanyl 50 mcg was administered intravenously. Moderate Sedation Time: Approximately 20 minutes. The patient's level of consciousness and vital signs were monitored continuously by radiology nursing throughout the  procedure under my direct supervision. PROCEDURE: After discussing the risks and benefits of this procedure in, informed consent was obtained. Abdomen was sterilely prepped and draped. Following localization of the gallbladder with CT and local anesthesia with 1% lidocaine, a 18 gauge needle was advanced into the gallbladder under CT guidance. A 035 Amplatz wire was placed. This is followed by placement of a 8.5 French drainage catheter. Approximately 100 cc of dark green bile removed. Catheter sutured in place and placed to bag drainage. Bile sent to the lab for microbiologic studies. FLUOROSCOPY TIME:  None. COMPLICATIONS: None immediate. FINDINGS: Successful percutaneous cholecystostomy as described above. IMPRESSION: Successful percutaneous cholecystostomy. Electronically Signed   By: Marcello Moores  Register   On: 03/25/2020 11:50        Scheduled Meds: . acetaminophen  1,000 mg Oral Q6H  . amLODipine  10 mg Oral Daily  . atorvastatin  80 mg Oral q1800  . carvedilol  25 mg Oral BID  . heparin  5,000 Units Subcutaneous Q8H  . insulin aspart  0-15 Units Subcutaneous TID WC  . nicotine  21 mg Transdermal Daily  . pantoprazole (PROTONIX) IV  40 mg Intravenous QHS  . potassium chloride  40 mEq Oral Once  . ramipril  5 mg Oral BID  . sertraline  50 mg Oral Daily  . sodium chloride flush  5 mL Intracatheter Q8H   Continuous Infusions: . sodium chloride Stopped (03/27/20 0606)  . piperacillin-tazobactam (ZOSYN)  IV 12.5 mL/hr at 03/27/20 1000     LOS: 2 days    Time spent: 25 minutes    Sharen Hones, MD [facility]   To contact the attending provider between 7A-7P or the covering provider during after hours 7P-7A, please log into the web site [email or web address] and access using universal [place] password for that web site. If you do not have the password, please call the hospital operator.  03/27/2020, 10:44 AM

## 2020-03-28 LAB — COMPREHENSIVE METABOLIC PANEL
ALT: 14 U/L (ref 0–44)
AST: 13 U/L — ABNORMAL LOW (ref 15–41)
Albumin: 2.9 g/dL — ABNORMAL LOW (ref 3.5–5.0)
Alkaline Phosphatase: 59 U/L (ref 38–126)
Anion gap: 6 (ref 5–15)
BUN: 10 mg/dL (ref 8–23)
CO2: 26 mmol/L (ref 22–32)
Calcium: 8.1 mg/dL — ABNORMAL LOW (ref 8.9–10.3)
Chloride: 109 mmol/L (ref 98–111)
Creatinine, Ser: 1.29 mg/dL — ABNORMAL HIGH (ref 0.61–1.24)
GFR calc Af Amer: >60 mL/min (ref >60)
GFR calc non Af Amer: 54 mL/min — ABNORMAL LOW (ref >60)
Glucose, Bld: 137 mg/dL — ABNORMAL HIGH (ref 70–99)
Potassium: 3.5 mmol/L (ref 3.5–5.1)
Sodium: 141 mmol/L (ref 135–145)
Total Bilirubin: 1 mg/dL (ref 0.3–1.2)
Total Protein: 6.1 g/dL — ABNORMAL LOW (ref 6.5–8.1)

## 2020-03-28 LAB — BODY FLUID CULTURE
Culture: NO GROWTH
Gram Stain: NONE SEEN

## 2020-03-28 LAB — CBC
HCT: 34.3 % — ABNORMAL LOW (ref 39.0–52.0)
Hemoglobin: 11.7 g/dL — ABNORMAL LOW (ref 13.0–17.0)
MCH: 29.3 pg (ref 26.0–34.0)
MCHC: 34.1 g/dL (ref 30.0–36.0)
MCV: 86 fL (ref 80.0–100.0)
Platelets: 194 10*3/uL (ref 150–400)
RBC: 3.99 MIL/uL — ABNORMAL LOW (ref 4.22–5.81)
RDW: 13.1 % (ref 11.5–15.5)
WBC: 6 10*3/uL (ref 4.0–10.5)
nRBC: 0 % (ref 0.0–0.2)

## 2020-03-28 LAB — GLUCOSE, CAPILLARY
Glucose-Capillary: 161 mg/dL — ABNORMAL HIGH (ref 70–99)
Glucose-Capillary: 175 mg/dL — ABNORMAL HIGH (ref 70–99)

## 2020-03-28 MED ORDER — AMOXICILLIN-POT CLAVULANATE 875-125 MG PO TABS: 1.0000 | ORAL_TABLET | Freq: Two times a day (BID) | ORAL | 0 refills | Status: AC

## 2020-03-28 MED ORDER — HYDROCODONE-ACETAMINOPHEN 5-325 MG PO TABS
1.0000 | ORAL_TABLET | Freq: Four times a day (QID) | ORAL | 0 refills | Status: DC | PRN
Start: 2020-03-28 — End: 2020-05-25

## 2020-03-28 MED ORDER — POTASSIUM CHLORIDE 20 MEQ PO PACK
40.0000 meq | PACK | Freq: Once | ORAL | Status: AC
  Administered 2020-03-28: 40 meq via ORAL
  Filled 2020-03-28: qty 2

## 2020-03-28 NOTE — Progress Notes (Signed)
PROGRESS NOTE    Gilbert Obrien  VIF:[id number] DOB: August 27, 1946 DOA: 03/24/2020 PCP: [name], MD    Brief Narrative: (Start on day 1 of progress note - keep it brief and live) Gilbert Obrien an 74 y.o.malehypertension, hyperlipidemia, diabetes mellitus, depression, CAD, myocardial infarction,DESstent placement,tobacco abuse,dCHF, CKD stage III, who wasadmitted due to cholecystitisbygeneral surgery team. Patient is s/p cholecystostomy drainage. We are asked to consult due tomultiple chronic medical issues.   Assessment & Plan:   Principal Problem:   Acute cholecystitis Active Problems:   Hypokalemia   Type II diabetes mellitus with renal manifestations (HCC)   CKD (chronic kidney disease), stage IIIa   HLD (hyperlipidemia)   Essential hypertension   Chronic diastolic CHF (congestive heart failure) (HCC)   Coronary artery disease   Depression   Tobacco abuse  #1.  Hypokalemia. Potassium still borderline low, will give 40 mEq oral again.  2.  Type 2 diabetes with chronic kidney disease stage IIIa. Condition stable, may resume home medicine at time of discharge.  3.  Essential hypertension. Continue home medicines.  4.  Chronic diastolic congestive heart failure. Not evidence of exacerbation.  5.  Acute cholecystitis status post cholecystostomy. Per general surgery.   DVT prophylaxis: Heparin Code Status: Full Family Communication: None Disposition Plan:   Patient came from:                                                                                                                          Anticipated d/c place:  Barriers to d/c OR conditions which need to be met to effect a safe d/c:    Procedures: Cholecystostomy Antimicrobials: Zosyn    Subjective: Patient was able to walk in the hallway without any short of breath.  Tolerating diet without nausea vomiting. No fever or chills.  Objective: Vitals:   03/27/20 0814  03/27/20 1140 03/28/20 0413 03/28/20 0848  BP: (!) 153/79 (!) 157/96 (!) 168/70 (!) 166/88  Pulse: 62 62 61   Resp: _0 Temp:  98 F (36.7 C) 97.8 F (36.6 C)   TempSrc:  Oral Oral   SpO2:  100% 98%   Weight:      Height:        Intake/Output Summary (Last 24 hours) at 03/28/2020 1054 Last data filed at 03/28/2020 0900 Gross per 24 hour  Intake 627 ml  Output 500 ml  Net 127 ml   Filed Weights   03/24/20 1804  Weight: 81.6 kg    Examination:  General exam: Appears calm and comfortable  Respiratory system: Clear to auscultation. Respiratory effort normal. Cardiovascular system: S1 & S2 heard, RRR. No JVD, murmurs, rubs, gallops or clicks. No pedal edema. Gastrointestinal system: Abdomen is nondistended, soft and nontender. No organomegaly or masses felt. Normal bowel sounds heard. Central nervous system: Alert and oriented. No focal neurological deficits. Extremities: Symmetric 5 x 5 power. Skin: No rashes, lesions or ulcers Psychiatry: Judgement and insight appear normal.  Mood & affect appropriate.     Data Reviewed: I have personally reviewed following labs and imaging studies  CBC: Recent Labs  Lab 03/24/20 1820 03/25/20 0103 03/26/20 0520 03/27/20 0707 03/28/20 0447  WBC 13.9* 17.7* 11.2* 7.9 6.0  HGB 14.5 14.0 12.5* 11.5* 11.7*  HCT 39.9 40.7 35.2* 33.1* 34.3*  MCV 81.3 85.0 84.4 84.9 86.0  PLT 234 217 173 180 161   Basic Metabolic Panel: Recent Labs  Lab 03/24/20 1820 03/25/20 0103 03/26/20 0520 03/27/20 0707 03/28/20 0447  NA 138 139 140 139 141  K 3.4* 3.3* 3.2* 3.5 3.5  CL 104 104 104 107 109  CO2 _0 GLUCOSE 184* 215* 135* 161* 137*  BUN _1 CREATININE 1.31* 1.43* 1.54* 1.37* 1.29*  CALCIUM 9.7 9.0 8.3* 7.8* 8.1*  MG  --  2.0 2.2  --   --    GFR: Estimated Creatinine Clearance: 51.4 mL/min (A) (by C-G formula based on SCr of 1.29 mg/dL (H)). Liver Function Tests: Recent Labs  Lab 03/24/20 1820  03/25/20 0103 03/26/20 0520 03/28/20 0447  AST _2 13*  ALT _3 ALKPHOS 91 91 66 59  BILITOT 1.6* 2.0* 1.8* 1.0  PROT 7.5 7.5 6.2* 6.1*  ALBUMIN 3.9 3.9 3.1* 2.9*   Recent Labs  Lab 03/24/20 1820  LIPASE 34   No results for input(s): AMMONIA in the last 168 hours. Coagulation Profile: Recent Labs  Lab 03/25/20 0824  INR 1.0   Cardiac Enzymes: No results for input(s): CKTOTAL, CKMB, CKMBINDEX, TROPONINI in the last 168 hours. BNP (last 3 results) No results for input(s): PROBNP in the last 8760 hours. HbA1C: No results for input(s): HGBA1C in the last 72 hours. CBG: Recent Labs  Lab 03/26/20 2129 03/27/20 0749 03/27/20 1137 03/27/20 1611 03/28/20 0735  GLUCAP 128* 151* 158* 76 161*   Lipid Profile: No results for input(s): CHOL, HDL, LDLCALC, TRIG, CHOLHDL, LDLDIRECT in the last 72 hours. Thyroid Function Tests: No results for input(s): TSH, T4TOTAL, FREET4, T3FREE, THYROIDAB in the last 72 hours. Anemia Panel: No results for input(s): VITAMINB12, FOLATE, FERRITIN, TIBC, IRON, RETICCTPCT in the last 72 hours. Sepsis Labs: No results for input(s): PROCALCITON, LATICACIDVEN in the last 168 hours.  Recent Results (from the past 240 hour(s))  SARS Coronavirus 2 by RT PCR (hospital order, performed in [facility] hospital lab) Nasopharyngeal Nasopharyngeal Swab     Status: None   Collection Time: 03/24/20 11:35 PM   Specimen: Nasopharyngeal Swab  Result Value Ref Range Status   SARS Coronavirus 2 NEGATIVE NEGATIVE Final    Comment: (NOTE) SARS-CoV-2 target nucleic acids are NOT DETECTED.  The SARS-CoV-2 RNA is generally detectable in upper and lower respiratory specimens during the acute phase of infection. The lowest concentration of SARS-CoV-2 viral copies this assay can detect is 250 copies / mL. A negative result does not preclude SARS-CoV-2 infection and should not be used as the sole basis for treatment or other patient management decisions.   A negative result may occur with improper specimen collection / handling, submission of specimen other than nasopharyngeal swab, presence of viral mutation(s) within the areas targeted by this assay, and inadequate number of viral copies (<250 copies / mL). A negative result must be combined with clinical observations, patient history, and epidemiological information.  Fact Sheet for Patients:   StrictlyIdeas.no  Fact Sheet for Healthcare Providers: BankingDealers.co.za  This test is not yet approved or  cleared by the [place] and has been authorized for detection and/or diagnosis of SARS-CoV-2 by FDA under an Emergency Use Authorization (EUA).  This EUA will remain in effect (meaning this test can be used) for the duration of the COVID-19 declaration under Section 564(b)(1) of the Act, 21 U.S.C. section 360bbb-3(b)(1), unless the authorization is terminated or revoked sooner.  Performed at [facility], [address] [place], [place] [address]   Body fluid culture     Status: None   Collection Time: 03/25/20 11:19 AM   Specimen: BILE; Body Fluid  Result Value Ref Range Status   Specimen Description   Final    BILE Performed at [facility], [address] [place], [place] [address]    Special Requests   Final    NONE Performed at [facility], [place], [place] [address]    Gram Stain NO WBC SEEN NO ORGANISMS SEEN   Final   Culture   Final    NO GROWTH 3 DAYS Performed at [facility], [place] [address] [place], [place] [address]    Report Status 03/28/2020 FINAL  Final         Radiology Studies: No results found.      Scheduled Meds: . acetaminophen  1,000 mg Oral Q6H  . amLODipine  10 mg Oral Daily  . atorvastatin  80 mg Oral q1800  . carvedilol  25 mg Oral BID  . heparin  5,000 Units Subcutaneous Q8H  . insulin aspart  0-15 Units Subcutaneous  TID WC  . nicotine  21 mg Transdermal Daily  . pantoprazole (PROTONIX) IV  40 mg Intravenous QHS  . ramipril  5 mg Oral BID  . sertraline  50 mg Oral Daily  . sodium chloride flush  5 mL Intracatheter Q8H   Continuous Infusions: . sodium chloride Stopped (03/27/20 0606)  . piperacillin-tazobactam (ZOSYN)  IV 3.375 g (03/28/20 0847)     LOS: 3 days    Time spent: 28 minutes    Sharen Hones, MD [facility]   To contact the attending provider between 7A-7P or the covering provider during after hours 7P-7A, please log into the web site [email or web address] and access using universal [place] password for that web site. If you do not have the password, please call the hospital operator.  03/28/2020, 10:54 AM

## 2020-03-28 NOTE — Plan of Care (Signed)
The patient has been discharged. IV has been removed. No falls. Supplies provided for dressing change around drain. Patient provided with flushed for 82m of NS for daily flushes, Split gauze, tape, container to empty drain.  Problem: Education: Goal: Knowledge of General Education information will improve Description: Including pain rating scale, medication(s)/side effects and non-pharmacologic comfort measures Outcome: Completed/Met   Problem: Health Behavior/Discharge Planning: Goal: Ability to manage health-related needs will improve Outcome: Completed/Met   Problem: Clinical Measurements: Goal: Ability to maintain clinical measurements within normal limits will improve Outcome: Completed/Met Goal: Will remain free from infection Outcome: Completed/Met Goal: Diagnostic test results will improve Outcome: Completed/Met Goal: Respiratory complications will improve Outcome: Completed/Met Goal: Cardiovascular complication will be avoided Outcome: Completed/Met   Problem: Activity: Goal: Risk for activity intolerance will decrease Outcome: Completed/Met   Problem: Nutrition: Goal: Adequate nutrition will be maintained Outcome: Completed/Met   Problem: Coping: Goal: Level of anxiety will decrease Outcome: Completed/Met

## 2020-03-28 NOTE — Discharge Summary (Signed)
Patient ID: Gilbert Obrien MRN: [id number] DOB/AGE: 1946-06-10 74 y.o.  Admit date: 03/24/2020 Discharge date: 03/28/2020   Discharge Diagnoses:  Principal Problem:   Acute cholecystitis Active Problems:   Hypokalemia   Type II diabetes mellitus with renal manifestations (HCC)   CKD (chronic kidney disease), stage IIIa   HLD (hyperlipidemia)   Essential hypertension   Chronic diastolic CHF (congestive heart failure) (HCC)   Coronary artery disease   Depression   Tobacco abuse   Procedures: Cholecystostomy tube  Hospital Course: 74 year old male with multiple comorbidities including coronary artery disease, on  antiplatelet therapy with prior stents admitted with cholecystitis and resuscitated placed on IV antibiotics and cholecystostomy tube was also performed by interventional radiology.  He improved significantly with no evidence of abdominal pain and no evidence of sepsis or cholangitis.  At  The time of discharge the patient was ambulating,  pain was controlled.  Her vital signs were stable and she was afebrile.   physical exam at discharge showed a pt  in no acute distress.  Awake and alert.  Abdomen: Soft, no peritonitis, cholecystostomy tube in place and working.  Extremities well-perfused and no edema.  Condition of the patient the time of discharge was stable   Consults: Hospitalist  Disposition: Discharge disposition: 01-Home or Self Care       Discharge Instructions    Call MD for:  difficulty breathing, headache or visual disturbances   Complete by: As directed    Call MD for:  extreme fatigue   Complete by: As directed    Call MD for:  hives   Complete by: As directed    Call MD for:  persistant dizziness or light-headedness   Complete by: As directed    Call MD for:  persistant nausea and vomiting   Complete by: As directed    Call MD for:  redness, tenderness, or signs of infection (pain, swelling, redness, odor or green/yellow discharge around  incision site)   Complete by: As directed    Call MD for:  severe uncontrolled pain   Complete by: As directed    Call MD for:  temperature >100.4   Complete by: As directed    Diet - low sodium heart healthy   Complete by: As directed    Discharge instructions   Complete by: As directed    Please teach pt about chole drain care, may flush daily.   Increase activity slowly   Complete by: As directed      Allergies as of 03/28/2020      Reactions   Celecoxib Anaphylaxis   Swelling in throat.   Sulfa Antibiotics Nausea And Vomiting, Swelling      Medication List    TAKE these medications   acetaminophen 325 MG tablet Commonly known as: TYLENOL Take 2 tablets (650 mg total) by mouth every 4 (four) hours as needed for mild pain (or temp > 37.5 C (99.5 F)).   amLODipine 10 MG tablet Commonly known as: NORVASC TAKE 1 TABLET EVERY DAY   amoxicillin-clavulanate 875-125 MG tablet Commonly known as: Augmentin Take 1 tablet by mouth 2 (two) times daily for 10 days.   aspirin EC 81 MG tablet Take 1 tablet (81 mg total) by mouth daily.   atorvastatin 80 MG tablet Commonly known as: LIPITOR Take 1 tablet (80 mg total) by mouth daily at 6 PM.   carvedilol 25 MG tablet Commonly known as: COREG TAKE 1 TABLET TWICE DAILY   furosemide 20 MG tablet Commonly  known as: LASIX TAKE 1 TABLET EVERY DAY   glimepiride 2 MG tablet Commonly known as: AMARYL Take 2 mg by mouth in the morning and at bedtime.   HYDROcodone-acetaminophen 5-325 MG tablet Commonly known as: NORCO/VICODIN Take 1 tablet by mouth every 6 (six) hours as needed for moderate pain.   nitroGLYCERIN 0.4 MG SL tablet Commonly known as: NITROSTAT Place 1 tablet (0.4 mg total) under the tongue every 5 (five) minutes x 3 doses as needed for chest pain.   ramipril 5 MG capsule Commonly known as: ALTACE TAKE 1 CAPSULE TWICE DAILY   sertraline 50 MG tablet Commonly known as: ZOLOFT Take 50 mg by mouth daily.    temazepam 15 MG capsule Commonly known as: RESTORIL Take 15 mg by mouth at bedtime as needed for sleep.       Follow-up Information    Olean Ree, MD Follow up on 04/07/2020.   Specialty: General Surgery Contact information: [address] [place] [address] [phone]                Caroleen Hamman, MD FACS

## 2020-04-01 ENCOUNTER — Other Ambulatory Visit: Payer: Self-pay | Admitting: Cardiovascular Disease

## 2020-04-02 DIAGNOSIS — K649 Unspecified hemorrhoids: Secondary | ICD-10-CM | POA: Diagnosis not present

## 2020-04-02 DIAGNOSIS — K6289 Other specified diseases of anus and rectum: Secondary | ICD-10-CM | POA: Diagnosis not present

## 2020-04-07 ENCOUNTER — Telehealth: Payer: Self-pay | Admitting: Surgery

## 2020-04-07 NOTE — Telephone Encounter (Signed)
Incoming call from patient.  States that his bag stopped draining last night and is still not draining at all this morning.  He states that when he last drained the bag, it went from a bloody to a brownish, milk like color.  He is concerned of his bag not draining now.  Please call him.  He does have a hospital follow up with Dr. Hampton Abbot this Friday, 04/09/20.  Thank you.

## 2020-04-07 NOTE — Telephone Encounter (Signed)
Called patient back. Pt states that the color draining is now a normal color, no bloody nor brown. Patient has been flushing it. Pt denies fevers, chills, N, V, D. Appetite is good and normal. Bm's are normal. Pt denies drainage outside of the tube itself. Pt states he is keeping track of the output drainage. Advised pt I spoke to Dr Hampton Abbot. He states as long as everything is looking great, and no abdominal pain, it is okay to keep appt for Friday 04/09/20. Pt voiced understanding and states he is okay waiting until Fridays appt. Advised pt to call office if anything changes. Pt agrees. No further concerns

## 2020-04-09 ENCOUNTER — Other Ambulatory Visit: Payer: Self-pay

## 2020-04-09 ENCOUNTER — Encounter: Payer: Self-pay | Admitting: Surgery

## 2020-04-09 ENCOUNTER — Ambulatory Visit: Payer: Medicare HMO | Admitting: Surgery

## 2020-04-09 VITALS — BP 166/79 | HR 77 | Temp 98.7°F | Resp 12 | Ht 67.0 in | Wt 186.4 lb

## 2020-04-09 DIAGNOSIS — K819 Cholecystitis, unspecified: Secondary | ICD-10-CM

## 2020-04-09 NOTE — Patient Instructions (Addendum)
Watch out for any increased belly pain or drainage around the opening of your drain(where it comes out through your skin), or pain with flushing.   Continue to flush once daily only use 1/2 of the fluid in the syringe each time. Call us if you need a refill of your flush syringes.   You are scheduled for a drain study at [facility]) on Tuesday August 10th. Please arrive by 9:45 am.   You will follow up here after the study is completed to discuss further treatment with Dr Hampton Abbot.

## 2020-04-09 NOTE — Progress Notes (Signed)
04/09/2020  History of Present Illness: Gilbert Obrien is a 74 y.o. male admitted on 6/30 with acute cholecystitis.  He was initially scheduled for surgery but his WBC and creatinine worsened, and given that he's on Aspirin for cardiac stents, we decided to proceed with percutaneous cholecystostomy tube instead.  This was done on 7/1.  He was discharged and today presents for follow up.  He reports that he's been doing well, without any significant abdominal pain except at the drain site.  Reports that earlier this week he had minimal drainage, but otherwise now is draining ok.  He's flushing the catheter as instructed.    Past Medical History: Past Medical History:  Diagnosis Date  . Arthritis   . Coronary artery disease    a. STEMI 03/2016 DESx1 to LCx, DES x 1 Mid LAD, DES x1 prox RCA  . Depression   . Diabetes mellitus without complication ([place])    type 2  . History of acute inferior wall myocardial infarction 04/15/2016   inf-lat/post >> PCI with DES of LCx; staged PCI of LAD and RCA  . Hypercholesteremia   . Hypertension   . Ischemic cardiomyopathy 04/19/2016   A. Inf-lat/post STEMI 7/17 >> b. Echo 04/17/16: Septal and post lateral HK, poor image quality, EF 35-40% // b. Echo 11/17: mild LVH, EF 50-55, inf-lat and ant-lat HK, Gr 1 DD, borderline dilated aortic root (37 mm), MAC  . Leg pain    ABIs 3/19:  normal     Past Surgical History: Past Surgical History:  Procedure Laterality Date  . CARDIAC CATHETERIZATION N/A 04/15/2016   Procedure: Left Heart Cath and Coronary Angiography;  Surgeon: Sherren Mocha, MD;  Location: [place] [facility];  Service: Cardiovascular;  Laterality: N/A;  . CARDIAC CATHETERIZATION N/A 04/15/2016   Procedure: Coronary Stent Intervention;  Surgeon: Sherren Mocha, MD;  Location: [place] [facility];  Service: Cardiovascular;  Laterality: N/A;  . CARDIAC CATHETERIZATION N/A 04/17/2016   Procedure: Coronary Stent Intervention;  Surgeon: Burnell Blanks, MD;  Location: [place] [facility];  Service: Cardiovascular;  Laterality: N/A;  . CARDIAC CATHETERIZATION N/A 04/18/2016   Procedure: Coronary Stent Intervention;  Surgeon: Burnell Blanks, MD;  Location: [place] [facility];  Service: Cardiovascular;  Laterality: N/A;  . CARDIAC CATHETERIZATION N/A 04/18/2016   Procedure: Temporary Pacemaker;  Surgeon: Burnell Blanks, MD;  Location: [place] [facility];  Service: Cardiovascular;  Laterality: N/A;  . CORONARY STENT PLACEMENT  04/17/2016    Severe stenosis proximal LAD, now s/p successful PTCA/DES x 1 proximal and mid LAD  . COSMETIC SURGERY  1974   right side facial       . SHOULDER SURGERY      Home Medications: Prior to Admission medications   Medication Sig Start Date End Date Taking? Authorizing Provider  acetaminophen (TYLENOL) 325 MG tablet Take 2 tablets (650 mg total) by mouth every 4 (four) hours as needed for mild pain (or temp > 37.5 C (99.5 F)). 10/05/16  Yes Robbie Lis, MD  amLODipine (NORVASC) 10 MG tablet TAKE 1 TABLET EVERY DAY 04/02/20  Yes Sherren Mocha, MD  aspirin EC 81 MG tablet Take 1 tablet (81 mg total) by mouth daily. 12/19/19  Yes Sherren Mocha, MD  atorvastatin (LIPITOR) 80 MG tablet Take 1 tablet (80 mg total) by mouth daily at 6 PM. 04/19/16  Yes Arbutus Leas, NP  carvedilol (COREG) 25 MG tablet TAKE 1 TABLET TWICE DAILY 07/24/19  Yes [name],  Legrand Como, MD  furosemide (LASIX) 20 MG tablet TAKE 1 TABLET EVERY DAY 07/10/19  Yes Daune Perch, NP  glimepiride (AMARYL) 2 MG tablet Take 2 mg by mouth in the morning and at bedtime.  10/17/17  Yes [provider]  HYDROcodone-acetaminophen (NORCO/VICODIN) 5-325 MG tablet Take 1 tablet by mouth every 6 (six) hours as needed for moderate pain. 03/28/20  Yes Pabon, Diego F, MD  nitroGLYCERIN (NITROSTAT) 0.4 MG SL tablet Place 1 tablet (0.4 mg total) under the tongue every 5 (five) minutes x 3 doses as needed for chest pain. 09/11/17  Yes  Sherren Mocha, MD  ramipril (ALTACE) 5 MG capsule TAKE 1 CAPSULE TWICE DAILY 09/22/19  Yes Sherren Mocha, MD  sertraline (ZOLOFT) 50 MG tablet Take 50 mg by mouth daily.   Yes [provider]  temazepam (RESTORIL) 15 MG capsule Take 15 mg by mouth at bedtime as needed for sleep.   Yes [provider]    Allergies: Allergies  Allergen Reactions  . Celecoxib Anaphylaxis    Swelling in throat.  . Sulfa Antibiotics Nausea And Vomiting and Swelling    Review of Systems: Review of Systems  Constitutional: Negative for chills and fever.  Respiratory: Negative for shortness of breath.   Cardiovascular: Negative for chest pain.  Gastrointestinal: Negative for abdominal pain, nausea and vomiting.  Skin: Negative for rash.    Physical Exam BP (!) 166/79   Pulse 77   Temp 98.7 F (37.1 C)   Resp 12   Ht _0  (1.702 m)   Wt 186 lb 6.4 oz (84.6 kg)   SpO2 97%   BMI 29.19 kg/m  CONSTITUTIONAL: No acute distress HEENT:  Normocephalic, atraumatic, extraocular motion intact. RESPIRATORY:  Lungs are clear, and breath sounds are equal bilaterally. Normal respiratory effort without pathologic use of accessory muscles. CARDIOVASCULAR: Heart is regular without murmurs, gallops, or rubs. GI: The abdomen is soft, non-distended, non-tender to palpation.  RUQ drain in place, with bilious fluid in bag.  No evidence of surrounding infection around the drain.  NEUROLOGIC:  Motor and sensation is grossly normal.  Cranial nerves are grossly intact. PSYCH:  Alert and oriented to person, place and time. Affect is normal.  Labs/Imaging: None recently  Assessment and Plan: This is a 74 y.o. male with acute cholecystitis, s/p percutaneous cholecystostomy tube.  --Discussed with the patient the stepwise approach in the management of his cholecystitis.  We will order a cholangiogram through the drain for the week of August 9th.  Then he will follow up with me to discuss results and  schedule him for surgery.  Overall, he would like the drain and gallbladder removed.  Discussed with him that we want to wait 6-8 weeks from his initial episode to make sure inflammation is down and infection is well treated.  We also will need to get medical clearance as well to make sure he's safe for surgery. --Follow up with me after cholangiogram.  Face-to-face time spent with the patient and care providers was 15 minutes, with more than 50% of the time spent counseling, educating, and coordinating care of the patient.     Melvyn Neth, [facility]

## 2020-04-19 ENCOUNTER — Telehealth: Payer: Self-pay | Admitting: Surgery

## 2020-04-19 ENCOUNTER — Other Ambulatory Visit: Payer: Self-pay

## 2020-04-19 MED ORDER — SODIUM CHLORIDE 0.9% FLUSH: 10.0000 mL | Freq: Every day | INTRAVENOUS | 1 refills | Status: DC

## 2020-04-19 NOTE — Telephone Encounter (Signed)
Patient called back and said they were called Flush syringes that he was needing and patient is using  [facility] on [address]

## 2020-04-19 NOTE — Telephone Encounter (Signed)
Patient is calling and is needing something to flush his tube. Please call patient and advise.

## 2020-04-19 NOTE — Telephone Encounter (Signed)
Spoke with the patient and let him know that [facility] does not carry these. We have called them into the [facility] on [address]. He is aware.

## 2020-04-21 ENCOUNTER — Ambulatory Visit: Admit: 2020-04-21 | Payer: Medicare HMO | Admitting: Internal Medicine

## 2020-04-21 SURGERY — COLONOSCOPY WITH PROPOFOL
Anesthesia: General

## 2020-05-04 ENCOUNTER — Other Ambulatory Visit: Payer: Self-pay

## 2020-05-04 ENCOUNTER — Ambulatory Visit
Admission: RE | Admit: 2020-05-04 | Discharge: 2020-05-04 | Disposition: A | Discharge status: Home / Self Care | Payer: Medicare HMO | Source: Ambulatory Visit | Attending: Surgery | Admitting: Surgery

## 2020-05-04 DIAGNOSIS — K819 Cholecystitis, unspecified: Secondary | ICD-10-CM | POA: Insufficient documentation

## 2020-05-04 DIAGNOSIS — K8000 Calculus of gallbladder with acute cholecystitis without obstruction: Secondary | ICD-10-CM | POA: Diagnosis not present

## 2020-05-04 MED ORDER — IOHEXOL 300 MG/ML  SOLN
25.0000 mL | Freq: Once | INTRAMUSCULAR | Status: AC | PRN
  Administered 2020-05-04: 20 mL

## 2020-05-10 ENCOUNTER — Telehealth: Payer: Self-pay | Admitting: *Deleted

## 2020-05-10 ENCOUNTER — Telehealth: Payer: Self-pay | Admitting: Surgery

## 2020-05-10 ENCOUNTER — Other Ambulatory Visit: Payer: Self-pay

## 2020-05-10 ENCOUNTER — Ambulatory Visit (INDEPENDENT_AMBULATORY_CARE_PROVIDER_SITE_OTHER): Payer: Medicare HMO | Admitting: Surgery

## 2020-05-10 ENCOUNTER — Encounter: Payer: Self-pay | Admitting: Surgery

## 2020-05-10 ENCOUNTER — Telehealth: Payer: Self-pay

## 2020-05-10 VITALS — BP 155/81 | HR 73 | Temp 98.3°F | Ht 67.0 in | Wt 189.6 lb

## 2020-05-10 DIAGNOSIS — K819 Cholecystitis, unspecified: Secondary | ICD-10-CM | POA: Diagnosis not present

## 2020-05-10 NOTE — Telephone Encounter (Signed)
Patient returning call.

## 2020-05-10 NOTE — Telephone Encounter (Signed)
Outgoing call is made, left message to call. Please advise patient of Pre-Admission date/time, COVID Testing date and Surgery date.  Surgery Date: 05/25/20 Preadmission Testing Date: 05/18/20 (phone 8a-1p) Covid Testing Date: 05/21/20 - patient advised to go to the [place] [facility] between 8a-1p  Also patient needs to call [phone], between 1-3:00pm the day before surgery, to find out what time to arrive for surgery.

## 2020-05-10 NOTE — Telephone Encounter (Signed)
Medical Clearance was faxed over to Dr.Mark F. Miller's office at [facility].  Cardiac Clearance was faxed over to Bagtown Cooper's office at [facility]

## 2020-05-10 NOTE — Telephone Encounter (Signed)
Ok to hold ASA for surgery as needed, 7 days.

## 2020-05-10 NOTE — H&P (View-Only) (Signed)
05/10/2020  History of Present Illness: Gilbert Obrien is a 74 y.o. male history of acute cholecystitis status post percutaneous cholecystostomy tube placement on 03/25/2020.  He presents today for follow-up.  He had a cholangiogram through the drain on 8/10 which showed cystic duct obstruction with no contrast filling the common bile duct or small intestine.  Today the patient reports that he has been doing well without any significant abdominal pain.  He has been tolerating a diet and able to do his usual activities.  He does get some shortness of breath with his activities which is not abnormal for him.  His drain is functioning well he has been flushing it as instructed.  Of note, the patient had scant knee pain July 2017 and had stents placed.  He is followed by cardiology.  He is off Plavix now but continues taking daily aspirin.  Past Medical History: Past Medical History:  Diagnosis Date  . Arthritis   . Coronary artery disease    a. STEMI 03/2016 DESx1 to LCx, DES x 1 Mid LAD, DES x1 prox RCA  . Depression   . Diabetes mellitus without complication ([place])    type 2  . History of acute inferior wall myocardial infarction 04/15/2016   inf-lat/post >> PCI with DES of LCx; staged PCI of LAD and RCA  . Hypercholesteremia   . Hypertension   . Ischemic cardiomyopathy 04/19/2016   A. Inf-lat/post STEMI 7/17 >> b. Echo 04/17/16: Septal and post lateral HK, poor image quality, EF 35-40% // b. Echo 11/17: mild LVH, EF 50-55, inf-lat and ant-lat HK, Gr 1 DD, borderline dilated aortic root (37 mm), MAC  . Leg pain    ABIs 3/19:  normal     Past Surgical History: Past Surgical History:  Procedure Laterality Date  . CARDIAC CATHETERIZATION N/A 04/15/2016   Procedure: Left Heart Cath and Coronary Angiography;  Surgeon: Sherren Mocha, MD;  Location: [place] [facility];  Service: Cardiovascular;  Laterality: N/A;  . CARDIAC CATHETERIZATION N/A 04/15/2016   Procedure: Coronary Stent Intervention;   Surgeon: Sherren Mocha, MD;  Location: [place] [facility];  Service: Cardiovascular;  Laterality: N/A;  . CARDIAC CATHETERIZATION N/A 04/17/2016   Procedure: Coronary Stent Intervention;  Surgeon: Burnell Blanks, MD;  Location: [place] [facility];  Service: Cardiovascular;  Laterality: N/A;  . CARDIAC CATHETERIZATION N/A 04/18/2016   Procedure: Coronary Stent Intervention;  Surgeon: Burnell Blanks, MD;  Location: [place] [facility];  Service: Cardiovascular;  Laterality: N/A;  . CARDIAC CATHETERIZATION N/A 04/18/2016   Procedure: Temporary Pacemaker;  Surgeon: Burnell Blanks, MD;  Location: [place] [facility];  Service: Cardiovascular;  Laterality: N/A;  . CORONARY STENT PLACEMENT  04/17/2016    Severe stenosis proximal LAD, now s/p successful PTCA/DES x 1 proximal and mid LAD  . COSMETIC SURGERY  1974   right side facial       . SHOULDER SURGERY      Home Medications: Prior to Admission medications   Medication Sig Start Date End Date Taking? Authorizing Provider  acetaminophen (TYLENOL) 325 MG tablet Take 2 tablets (650 mg total) by mouth every 4 (four) hours as needed for mild pain (or temp > 37.5 C (99.5 F)). 10/05/16  Yes Robbie Lis, MD  amLODipine (NORVASC) 10 MG tablet TAKE 1 TABLET EVERY DAY 04/02/20  Yes Sherren Mocha, MD  aspirin EC 81 MG tablet Take 1 tablet (81 mg total) by mouth daily. 12/19/19  Yes Sherren Mocha, MD  atorvastatin (LIPITOR) 80 MG tablet Take 1 tablet (80 mg total) by mouth daily at 6 PM. 04/19/16  Yes Arbutus Leas, NP  carvedilol (COREG) 25 MG tablet TAKE 1 TABLET TWICE DAILY 07/24/19  Yes Sherren Mocha, MD  furosemide (LASIX) 20 MG tablet TAKE 1 TABLET EVERY DAY 07/10/19  Yes Daune Perch, NP  glimepiride (AMARYL) 2 MG tablet Take 2 mg by mouth in the morning and at bedtime.  10/17/17  Yes [provider]  HYDROcodone-acetaminophen (NORCO/VICODIN) 5-325 MG tablet Take 1 tablet by mouth every 6 (six) hours as needed for  moderate pain. 03/28/20  Yes Pabon, Diego F, MD  nitroGLYCERIN (NITROSTAT) 0.4 MG SL tablet Place 1 tablet (0.4 mg total) under the tongue every 5 (five) minutes x 3 doses as needed for chest pain. 09/11/17  Yes Sherren Mocha, MD  ramipril (ALTACE) 5 MG capsule TAKE 1 CAPSULE TWICE DAILY 09/22/19  Yes Sherren Mocha, MD  sertraline (ZOLOFT) 50 MG tablet Take 50 mg by mouth daily.   Yes [provider]  sodium chloride flush (NS) 0.9 % SOLN Inject 10 mLs into the vein daily. Inject into drain 1-2 times a day. 04/19/20  Yes Taevon Aschoff, MD  temazepam (RESTORIL) 15 MG capsule Take 15 mg by mouth at bedtime as needed for sleep.   Yes [provider]    Allergies: Allergies  Allergen Reactions  . Celecoxib Anaphylaxis    Swelling in throat.  . Sulfa Antibiotics Nausea And Vomiting and Swelling    Review of Systems: Review of Systems  Constitutional: Negative for chills and fever.  Respiratory: Negative for shortness of breath.   Cardiovascular: Negative for chest pain.  Gastrointestinal: Negative for abdominal pain, constipation, diarrhea, nausea and vomiting.    Physical Exam BP (!) 155/81   Pulse 73   Temp 98.3 F (36.8 C) (Oral)   Ht _0  (1.702 m)   Wt 189 lb 9.6 oz (86 kg)   SpO2 97%   BMI 29.70 kg/m  CONSTITUTIONAL: No acute distress, in better spirits today. HEENT:  Normocephalic, atraumatic, extraocular motion intact. RESPIRATORY:  Lungs are clear, and breath sounds are equal bilaterally. Normal respiratory effort without pathologic use of accessory muscles. CARDIOVASCULAR: Heart is regular without murmurs, gallops, or rubs. GI: The abdomen is soft, nondistended, nontender to palpation.  Right upper quadrant cholecystostomy drain is in place with clear bilious fluid in the bag.  NEUROLOGIC:  Motor and sensation is grossly normal.  Cranial nerves are grossly intact. PSYCH:  Alert and oriented to person, place and time. Affect is  normal.  Labs/Imaging: Cholangiogram on 05/04/2020: IMPRESSION: 1. Cholecystostomy catheter in good position. Non filling of the CBD.  Assessment and Plan: This is a 74 y.o. male with a history of acute cholecystitis status post percutaneous cholecystostomy drain placement on 03/25/2020.  -Reviewed with the patient the cholangiogram results.  At this point the patient wishes to proceed with surgery to remove his gallbladder and drain.  Discussed with him that this would be the ideal scenario and I would like to proceed with robotic cholecystectomy.  However given his cardiac history, we will send medical and cardiac clearance forms to his PCP and cardiologist to further evaluate his risk for surgery to be able to come off aspirin for surgery as well.  Discussed with him the role of a robotic cholecystectomy including the risks of bleeding, infection, injury to surrounding structures, postoperative activity restrictions, but this would be an outpatient surgery, the potential for open incision,  and he is willing to proceed.  Ideally would like to stop his aspirin 5 days prior to surgery.  Tentatively, we will schedule him for 05/25/2020 which would mean that his last dose of aspirin will be 05/19/2020.  Face-to-face time spent with the patient and care providers was 25 minutes, with more than 50% of the time spent counseling, educating, and coordinating care of the patient.     Melvyn Neth, [facility]

## 2020-05-10 NOTE — Telephone Encounter (Signed)
   [facility] Pre-operative Risk Assessment    HEARTCARE STAFF: - Please ensure there is not already an duplicate clearance open for this procedure. - Under Visit Info/Reason for Call, type in Other and utilize the format Clearance MM/DD/YY or Clearance TBD. Do not use dashes or single digits. - If request is for dental extraction, please clarify the # of teeth to be extracted.  Request for surgical clearance:  1. What type of surgery is being performed? Robotic assisted cholocystectomy   2. When is this surgery scheduled? 05/25/2020   3. What type of clearance is required (medical clearance vs. Pharmacy clearance to hold med vs. Both)? Both  4. Are there any medications that need to be held prior to surgery and how long?Asa 43m   5. Practice name and name of physician performing surgery? [facility], Dr JJacqulyn BathPiscoya  6. What is the office phone number? [phone]  7.   What is the office fax number? [phone] 8.   Anesthesia type (None, local, MAC, general) ? General   Gilbert Obrien 05/10/2020, 2:20 PM  _________________________________________________________________   (provider comments below)

## 2020-05-10 NOTE — Telephone Encounter (Signed)
Gilbert Obrien 74 year old male is requesting clearance for robotic assisted cholecystectomy.  May his aspirin be held for the procedure?  He was last seen in clinic on 12/19/2019.  He was doing well at that time had no cardiac complaints.  His PMH includes coronary artery disease status post STEMI 7/17 multivessel PCI with DES to LCx, LAD, pRCA and orbital arthrectomy.  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  Thank you for your help.  Jossie Ng. Tytus Strahle NP-C    05/10/2020, 2:56 PM [place] [address] Office [phone] Fax [phone]

## 2020-05-10 NOTE — Patient Instructions (Addendum)
Our surgery scheduler will contact you within the next 24-48 hours to discuss preparation and date and time of surgery. Please have the BLUE sheet available when she contacts you. If you have any questions regarding surgery, please do not hesitate to give our office a call.  Cardiac and Medical Clearance were sent at today's visit today. Patient will STOP for 5 days prior to surgery. Last Dose August 25 th, 2021  Laparoscopic Cholecystectomy Laparoscopic cholecystectomy is surgery to remove the gallbladder. The gallbladder is a pear-shaped organ that lies beneath the liver on the right side of the body. The gallbladder stores bile, which is a fluid that helps the body to digest fats. Cholecystectomy is often done for inflammation of the gallbladder (cholecystitis). This condition is usually caused by a buildup of gallstones (cholelithiasis) in the gallbladder. Gallstones can block the flow of bile, which can result in inflammation and pain. In severe cases, emergency surgery may be required. This procedure is done though small incisions in your abdomen (laparoscopic surgery). A thin scope with a camera (laparoscope) is inserted through one incision. Thin surgical instruments are inserted through the other incisions. In some cases, a laparoscopic procedure may be turned into a type of surgery that is done through a larger incision (open surgery). Tell a health care provider about:  Any allergies you have.  All medicines you are taking, including vitamins, herbs, eye drops, creams, and over-the-counter medicines.  Any problems you or family members have had with anesthetic medicines.  Any blood disorders you have.  Any surgeries you have had.  Any medical conditions you have.  Whether you are pregnant or may be pregnant. What are the risks? Generally, this is a safe procedure. However, problems may occur, including:  Infection.  Bleeding.  Allergic reactions to medicines.  Damage to  other structures or organs.  A stone remaining in the common bile duct. The common bile duct carries bile from the gallbladder into the small intestine.  A bile leak from the cyst duct that is clipped when your gallbladder is removed. What happens before the procedure? Staying hydrated Follow instructions from your health care provider about hydration, which may include:  Up to 2 hours before the procedure - you may continue to drink clear liquids, such as water, clear fruit juice, black coffee, and plain tea. Eating and drinking restrictions Follow instructions from your health care provider about eating and drinking, which may include:  8 hours before the procedure - stop eating heavy meals or foods such as meat, fried foods, or fatty foods.  6 hours before the procedure - stop eating light meals or foods, such as toast or cereal.  6 hours before the procedure - stop drinking milk or drinks that contain milk.  2 hours before the procedure - stop drinking clear liquids. Medicines  Ask your health care provider about: ? Changing or stopping your regular medicines. This is especially important if you are taking diabetes medicines or blood thinners. ? Taking medicines such as aspirin and ibuprofen. These medicines can thin your blood. Do not take these medicines before your procedure if your health care provider instructs you not to.  You may be given antibiotic medicine to help prevent infection. General instructions  Let your health care provider know if you develop a cold or an infection before surgery.  Plan to have someone take you home from the hospital or clinic.  Ask your health care provider how your surgical site will be marked or identified.  What happens during the procedure?   To reduce your risk of infection: ? Your health care team will wash or sanitize their hands. ? Your skin will be washed with soap. ? Hair may be removed from the surgical area.  An IV tube  may be inserted into one of your veins.  You will be given one or more of the following: ? A medicine to help you relax (sedative). ? A medicine to make you fall asleep (general anesthetic).  A breathing tube will be placed in your mouth.  Your surgeon will make several small cuts (incisions) in your abdomen.  The laparoscope will be inserted through one of the small incisions. The camera on the laparoscope will send images to a TV screen (monitor) in the operating room. This lets your surgeon see inside your abdomen.  Air-like gas will be pumped into your abdomen. This will expand your abdomen to give the surgeon more room to perform the surgery.  Other tools that are needed for the procedure will be inserted through the other incisions. The gallbladder will be removed through one of the incisions.  Your common bile duct may be examined. If stones are found in the common bile duct, they may be removed.  After your gallbladder has been removed, the incisions will be closed with stitches (sutures), staples, or skin glue.  Your incisions may be covered with a bandage (dressing). The procedure may vary among health care providers and hospitals. What happens after the procedure?  Your blood pressure, heart rate, breathing rate, and blood oxygen level will be monitored until the medicines you were given have worn off.  You will be given medicines as needed to control your pain.  Do not drive for 24 hours if you were given a sedative. This information is not intended to replace advice given to you by your health care provider. Make sure you discuss any questions you have with your health care provider. Document Revised: 08/24/2017 Document Reviewed: 02/28/2016 Elsevier Patient Education  [place].

## 2020-05-10 NOTE — Telephone Encounter (Signed)
Incoming call from the patient, he is now informed of all dates regarding his surgery and voices understanding.

## 2020-05-10 NOTE — Progress Notes (Signed)
05/10/2020  History of Present Illness: Gilbert Obrien is a 74 y.o. male history of acute cholecystitis status post percutaneous cholecystostomy tube placement on 03/25/2020.  He presents today for follow-up.  He had a cholangiogram through the drain on 8/10 which showed cystic duct obstruction with no contrast filling the common bile duct or small intestine.  Today the patient reports that he has been doing well without any significant abdominal pain.  He has been tolerating a diet and able to do his usual activities.  He does get some shortness of breath with his activities which is not abnormal for him.  His drain is functioning well he has been flushing it as instructed.  Of note, the patient had scant knee pain July 2017 and had stents placed.  He is followed by cardiology.  He is off Plavix now but continues taking daily aspirin.  Past Medical History: Past Medical History:  Diagnosis Date  . Arthritis   . Coronary artery disease    a. STEMI 03/2016 DESx1 to LCx, DES x 1 Mid LAD, DES x1 prox RCA  . Depression   . Diabetes mellitus without complication ([place])    type 2  . History of acute inferior wall myocardial infarction 04/15/2016   inf-lat/post >> PCI with DES of LCx; staged PCI of LAD and RCA  . Hypercholesteremia   . Hypertension   . Ischemic cardiomyopathy 04/19/2016   A. Inf-lat/post STEMI 7/17 >> b. Echo 04/17/16: Septal and post lateral HK, poor image quality, EF 35-40% // b. Echo 11/17: mild LVH, EF 50-55, inf-lat and ant-lat HK, Gr 1 DD, borderline dilated aortic root (37 mm), MAC  . Leg pain    ABIs 3/19:  normal     Past Surgical History: Past Surgical History:  Procedure Laterality Date  . CARDIAC CATHETERIZATION N/A 04/15/2016   Procedure: Left Heart Cath and Coronary Angiography;  Surgeon: Sherren Mocha, MD;  Location: [place] [facility];  Service: Cardiovascular;  Laterality: N/A;  . CARDIAC CATHETERIZATION N/A 04/15/2016   Procedure: Coronary Stent Intervention;   Surgeon: Sherren Mocha, MD;  Location: [place] [facility];  Service: Cardiovascular;  Laterality: N/A;  . CARDIAC CATHETERIZATION N/A 04/17/2016   Procedure: Coronary Stent Intervention;  Surgeon: Burnell Blanks, MD;  Location: [place] [facility];  Service: Cardiovascular;  Laterality: N/A;  . CARDIAC CATHETERIZATION N/A 04/18/2016   Procedure: Coronary Stent Intervention;  Surgeon: Burnell Blanks, MD;  Location: [place] [facility];  Service: Cardiovascular;  Laterality: N/A;  . CARDIAC CATHETERIZATION N/A 04/18/2016   Procedure: Temporary Pacemaker;  Surgeon: Burnell Blanks, MD;  Location: [place] [facility];  Service: Cardiovascular;  Laterality: N/A;  . CORONARY STENT PLACEMENT  04/17/2016    Severe stenosis proximal LAD, now s/p successful PTCA/DES x 1 proximal and mid LAD  . COSMETIC SURGERY  1974   right side facial       . SHOULDER SURGERY      Home Medications: Prior to Admission medications   Medication Sig Start Date End Date Taking? Authorizing Provider  acetaminophen (TYLENOL) 325 MG tablet Take 2 tablets (650 mg total) by mouth every 4 (four) hours as needed for mild pain (or temp > 37.5 C (99.5 F)). 10/05/16  Yes Robbie Lis, MD  amLODipine (NORVASC) 10 MG tablet TAKE 1 TABLET EVERY DAY 04/02/20  Yes Sherren Mocha, MD  aspirin EC 81 MG tablet Take 1 tablet (81 mg total) by mouth daily. 12/19/19  Yes Sherren Mocha, MD  atorvastatin (LIPITOR) 80 MG tablet Take 1 tablet (80 mg total) by mouth daily at 6 PM. 04/19/16  Yes Smith, Erin E, NP  carvedilol (COREG) 25 MG tablet TAKE 1 TABLET TWICE DAILY 07/24/19  Yes Cooper, Michael, MD  furosemide (LASIX) 20 MG tablet TAKE 1 TABLET EVERY DAY 07/10/19  Yes Hammond, Janine, NP  glimepiride (AMARYL) 2 MG tablet Take 2 mg by mouth in the morning and at bedtime.  10/17/17  Yes [provider]  HYDROcodone-acetaminophen (NORCO/VICODIN) 5-325 MG tablet Take 1 tablet by mouth every 6 (six) hours as needed for  moderate pain. 03/28/20  Yes Pabon, Diego F, MD  nitroGLYCERIN (NITROSTAT) 0.4 MG SL tablet Place 1 tablet (0.4 mg total) under the tongue every 5 (five) minutes x 3 doses as needed for chest pain. 09/11/17  Yes Cooper, Michael, MD  ramipril (ALTACE) 5 MG capsule TAKE 1 CAPSULE TWICE DAILY 09/22/19  Yes Cooper, Michael, MD  sertraline (ZOLOFT) 50 MG tablet Take 50 mg by mouth daily.   Yes [provider]  sodium chloride flush (NS) 0.9 % SOLN Inject 10 mLs into the vein daily. Inject into drain 1-2 times a day. 04/19/20  Yes Teralyn Mullins, MD  temazepam (RESTORIL) 15 MG capsule Take 15 mg by mouth at bedtime as needed for sleep.   Yes [provider]    Allergies: Allergies  Allergen Reactions  . Celecoxib Anaphylaxis    Swelling in throat.  . Sulfa Antibiotics Nausea And Vomiting and Swelling    Review of Systems: Review of Systems  Constitutional: Negative for chills and fever.  Respiratory: Negative for shortness of breath.   Cardiovascular: Negative for chest pain.  Gastrointestinal: Negative for abdominal pain, constipation, diarrhea, nausea and vomiting.    Physical Exam BP (!) 155/81   Pulse 73   Temp 98.3 F (36.8 C) (Oral)   Ht 5' 7" (1.702 m)   Wt 189 lb 9.6 oz (86 kg)   SpO2 97%   BMI 29.70 kg/m  CONSTITUTIONAL: No acute distress, in better spirits today. HEENT:  Normocephalic, atraumatic, extraocular motion intact. RESPIRATORY:  Lungs are clear, and breath sounds are equal bilaterally. Normal respiratory effort without pathologic use of accessory muscles. CARDIOVASCULAR: Heart is regular without murmurs, gallops, or rubs. GI: The abdomen is soft, nondistended, nontender to palpation.  Right upper quadrant cholecystostomy drain is in place with clear bilious fluid in the bag.  NEUROLOGIC:  Motor and sensation is grossly normal.  Cranial nerves are grossly intact. PSYCH:  Alert and oriented to person, place and time. Affect is  normal.  Labs/Imaging: Cholangiogram on 05/04/2020: IMPRESSION: 1. Cholecystostomy catheter in good position. Non filling of the CBD.  Assessment and Plan: This is a 73 y.o. male with a history of acute cholecystitis status post percutaneous cholecystostomy drain placement on 03/25/2020.  -Reviewed with the patient the cholangiogram results.  At this point the patient wishes to proceed with surgery to remove his gallbladder and drain.  Discussed with him that this would be the ideal scenario and I would like to proceed with robotic cholecystectomy.  However given his cardiac history, we will send medical and cardiac clearance forms to his PCP and cardiologist to further evaluate his risk for surgery to be able to come off aspirin for surgery as well.  Discussed with him the role of a robotic cholecystectomy including the risks of bleeding, infection, injury to surrounding structures, postoperative activity restrictions, but this would be an outpatient surgery, the potential for open incision,   and he is willing to proceed.  Ideally would like to stop his aspirin 5 days prior to surgery.  Tentatively, we will schedule him for 05/25/2020 which would mean that his last dose of aspirin will be 05/19/2020.  Face-to-face time spent with the patient and care providers was 25 minutes, with more than 50% of the time spent counseling, educating, and coordinating care of the patient.     Melvyn Neth, [facility]

## 2020-05-11 NOTE — Progress Notes (Signed)
Cardiac Clearance received from Richardson Dopp, PA-C at [facility]. The patient is at acceptable risk for surgery. He may hold his Aspirin prior to surgery. Clearance notes are in Epic.

## 2020-05-11 NOTE — Telephone Encounter (Signed)
Note faxed to surgeon. Note will be removed from preop pool. Richardson Dopp, PA-C    05/11/2020 10:59 AM

## 2020-05-11 NOTE — Telephone Encounter (Signed)
   Primary Cardiologist: Sherren Mocha, MD  Chart reviewed as part of pre-operative protocol coverage. Patient was contacted 05/11/2020 in reference to pre-operative risk assessment for pending surgery as outlined below.   Gilbert Obrien has the following problems:  Coronary artery disease ? S/p inferolateral/posterior STEMI 7/17 >> multivessel PCI  ? S/p DES to the LCx, staged DES to the LAD and orbital atherectomy +DES to pRCA  Ischemic cardiomyopathy ? 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 [name].  Since that day, Gilbert Obrien has done well.  He has chronic shortness of breath without change.  He has not had chest pain, syncope, leg swelling.  His risk of perioperative major cardiac event is high at 11% according to the Revised Cardiac Risk Index (RCRI).  However, his functional status is good.  He can achieve 5.62 METs according to the [facility] Activity Status Index (DASI).    Recommendations: Therefore, based on ACC/AHA guidelines, the patient would be at acceptable risk for the planned procedure without further cardiovascular testing.   He may hold ASA for 7 days prior to the procedure (if needed) and resume post op as soon as it is deemed safe by the performing surgeon.    Please call with questions.  Richardson Dopp, PA-C 05/11/2020, 10:53 AM

## 2020-05-12 ENCOUNTER — Other Ambulatory Visit: Payer: Self-pay | Admitting: Cardiovascular Disease

## 2020-05-13 ENCOUNTER — Telehealth: Payer: Self-pay | Admitting: Emergency Medicine

## 2020-05-13 NOTE — Progress Notes (Signed)
Medical Clearance received from Dr Emily Filbert. The patient is cleared at low risk for surgery.

## 2020-05-13 NOTE — Telephone Encounter (Signed)
Spoke to [place] in regards to receiving medical clearance form on patient. States she will have a nurse call back to the office with information.

## 2020-05-13 NOTE — Telephone Encounter (Signed)
Gilbert Obrien called back stating she is faxing the clearance back to [place].

## 2020-05-18 ENCOUNTER — Other Ambulatory Visit
Admission: RE | Admit: 2020-05-18 | Discharge: 2020-05-18 | Disposition: A | Discharge status: Home / Self Care | Payer: Medicare HMO | Source: Ambulatory Visit | Attending: Surgery | Admitting: Surgery

## 2020-05-18 HISTORY — DX: Dyspnea, unspecified: R06.00

## 2020-05-18 HISTORY — DX: Chronic kidney disease, unspecified: N18.9

## 2020-05-18 NOTE — Patient Instructions (Signed)
Your procedure is scheduled on: Tuesday May 25, 2020. Report to Day Surgery inside Person 2nd floor. To find out your arrival time please call [phone] between 1PM - 3PM on Monday May 24, 2020.  Remember: Instructions that are not followed completely may result in serious medical risk,  up to and including death, or upon the discretion of your surgeon and anesthesiologist your  surgery may need to be rescheduled.     _X__ 1. Do not eat food after midnight the night before your procedure.                 No chewing gum or hard candies. You may drink clear liquids up to 2 hours                 before you are scheduled to arrive for your surgery- DO not drink clear                 liquids within 2 hours of the start of your surgery.                 Clear Liquids include:  water, apple juice without pulp, clear Gatorade, G2 or                  Gatorade Zero (avoid Red/Purple/Blue), Black Coffee or Tea (Do not add                 anything to coffee or tea).  __X__2.  On the morning of surgery brush your teeth with toothpaste and water, you                may rinse your mouth with mouthwash if you wish.  Do not swallow any toothpaste of mouthwash.     _X__ 3.  No Alcohol for 24 hours before or after surgery.   _X__ 4.  Do Not Smoke or use e-cigarettes For 24 Hours Prior to Your Surgery.                 Do not use any chewable tobacco products for at least 6 hours prior to                 Surgery.  _X__  5.  Do not use any recreational drugs (marijuana, cocaine, heroin, ecstasy, MDMA or other)                For at least one week prior to your surgery.  Combination of these drugs with anesthesia                May have life threatening results.  __X__ 6.  Notify your doctor if there is any change in your medical condition      (cold, fever, infections).     Do not wear jewelry, make-up, hairpins, clips or nail polish. Do not wear lotions, powders, or  perfumes. You may wear deodorant. Do not shave 48 hours prior to surgery. Men may shave face and neck. Do not bring valuables to the hospital.    [facility] is not responsible for any belongings or valuables.  Contacts, dentures or bridgework may not be worn into surgery. Leave your suitcase in the car. After surgery it may be brought to your room. For patients admitted to the hospital, discharge time is determined by your treatment team.   Patients discharged the day of surgery will not be allowed to drive home.   Make arrangements for someone to be  with you for the first 24 hours of your Same Day Discharge.   __X__ Take these medicines the morning of surgery with A SIP OF WATER:    1. amLODipine (NORVASC) 10 MG  2. carvedilol (COREG) 25 MG   __X__ Use CHG Soap (or wipes) as directed  __X__ Stop  aspirin as instructed by your doctor.   __X__ Stop Anti-inflammatories such as Ibuprofen, aleve, naproxen, Advil and or BC powders.    __X__ Stop supplements until after surgery.    __X__ Do not start any herbal supplements before your surgery.    If you have any questions regarding your pre-procedure instructions,  Please call Pre-admit Testing at [phone]

## 2020-05-21 ENCOUNTER — Other Ambulatory Visit
Admission: RE | Admit: 2020-05-21 | Discharge: 2020-05-21 | Disposition: A | Discharge status: Home / Self Care | Payer: Medicare HMO | Source: Ambulatory Visit | Attending: Surgery | Admitting: Surgery

## 2020-05-21 ENCOUNTER — Other Ambulatory Visit: Payer: Self-pay

## 2020-05-21 DIAGNOSIS — Z01812 Encounter for preprocedural laboratory examination: Secondary | ICD-10-CM | POA: Insufficient documentation

## 2020-05-21 DIAGNOSIS — Z20822 Contact with and (suspected) exposure to covid-19: Secondary | ICD-10-CM | POA: Insufficient documentation

## 2020-05-21 LAB — SARS CORONAVIRUS 2 (TAT 6-24 HRS): SARS Coronavirus 2: NEGATIVE

## 2020-05-24 MED ORDER — INDOCYANINE GREEN 25 MG IV SOLR
2.5000 mg | Freq: Once | INTRAVENOUS | Status: AC
  Administered 2020-05-25: 2.5 mg via INTRAVENOUS
  Filled 2020-05-24: qty 1

## 2020-05-25 ENCOUNTER — Ambulatory Visit: Payer: Medicare HMO | Admitting: Certified Registered Nurse Anesthetist

## 2020-05-25 ENCOUNTER — Encounter: Payer: Self-pay | Admitting: Surgery

## 2020-05-25 ENCOUNTER — Other Ambulatory Visit: Payer: Self-pay

## 2020-05-25 ENCOUNTER — Encounter
Admission: RE | Disposition: A | Discharge status: Home / Self Care | Payer: Self-pay | Source: Home / Self Care | Attending: Surgery

## 2020-05-25 ENCOUNTER — Ambulatory Visit
Admission: RE | Admit: 2020-05-25 | Discharge: 2020-05-25 | Disposition: A | Discharge status: Home / Self Care | Payer: Medicare HMO | Attending: Surgery | Admitting: Surgery

## 2020-05-25 DIAGNOSIS — I129 Hypertensive chronic kidney disease with stage 1 through stage 4 chronic kidney disease, or unspecified chronic kidney disease: Secondary | ICD-10-CM | POA: Diagnosis not present

## 2020-05-25 DIAGNOSIS — Z87891 Personal history of nicotine dependence: Secondary | ICD-10-CM | POA: Diagnosis not present

## 2020-05-25 DIAGNOSIS — Z79899 Other long term (current) drug therapy: Secondary | ICD-10-CM | POA: Insufficient documentation

## 2020-05-25 DIAGNOSIS — K429 Umbilical hernia without obstruction or gangrene: Secondary | ICD-10-CM | POA: Diagnosis not present

## 2020-05-25 DIAGNOSIS — I255 Ischemic cardiomyopathy: Secondary | ICD-10-CM | POA: Insufficient documentation

## 2020-05-25 DIAGNOSIS — M1990 Unspecified osteoarthritis, unspecified site: Secondary | ICD-10-CM | POA: Diagnosis not present

## 2020-05-25 DIAGNOSIS — E7800 Pure hypercholesterolemia, unspecified: Secondary | ICD-10-CM | POA: Diagnosis not present

## 2020-05-25 DIAGNOSIS — K8010 Calculus of gallbladder with chronic cholecystitis without obstruction: Secondary | ICD-10-CM | POA: Diagnosis not present

## 2020-05-25 DIAGNOSIS — Z955 Presence of coronary angioplasty implant and graft: Secondary | ICD-10-CM | POA: Diagnosis not present

## 2020-05-25 DIAGNOSIS — N183 Chronic kidney disease, stage 3 unspecified: Secondary | ICD-10-CM | POA: Diagnosis not present

## 2020-05-25 DIAGNOSIS — I2510 Atherosclerotic heart disease of native coronary artery without angina pectoris: Secondary | ICD-10-CM | POA: Diagnosis not present

## 2020-05-25 DIAGNOSIS — K819 Cholecystitis, unspecified: Secondary | ICD-10-CM

## 2020-05-25 DIAGNOSIS — E1122 Type 2 diabetes mellitus with diabetic chronic kidney disease: Secondary | ICD-10-CM | POA: Insufficient documentation

## 2020-05-25 DIAGNOSIS — Z7982 Long term (current) use of aspirin: Secondary | ICD-10-CM | POA: Insufficient documentation

## 2020-05-25 DIAGNOSIS — Z7984 Long term (current) use of oral hypoglycemic drugs: Secondary | ICD-10-CM | POA: Diagnosis not present

## 2020-05-25 DIAGNOSIS — J449 Chronic obstructive pulmonary disease, unspecified: Secondary | ICD-10-CM | POA: Insufficient documentation

## 2020-05-25 DIAGNOSIS — I5032 Chronic diastolic (congestive) heart failure: Secondary | ICD-10-CM | POA: Diagnosis not present

## 2020-05-25 DIAGNOSIS — K810 Acute cholecystitis: Secondary | ICD-10-CM | POA: Diagnosis not present

## 2020-05-25 DIAGNOSIS — F329 Major depressive disorder, single episode, unspecified: Secondary | ICD-10-CM | POA: Insufficient documentation

## 2020-05-25 DIAGNOSIS — N1831 Chronic kidney disease, stage 3a: Secondary | ICD-10-CM | POA: Diagnosis not present

## 2020-05-25 DIAGNOSIS — I252 Old myocardial infarction: Secondary | ICD-10-CM | POA: Insufficient documentation

## 2020-05-25 DIAGNOSIS — I130 Hypertensive heart and chronic kidney disease with heart failure and stage 1 through stage 4 chronic kidney disease, or unspecified chronic kidney disease: Secondary | ICD-10-CM | POA: Diagnosis not present

## 2020-05-25 LAB — GLUCOSE, CAPILLARY
Glucose-Capillary: 168 mg/dL — ABNORMAL HIGH (ref 70–99)
Glucose-Capillary: 247 mg/dL — ABNORMAL HIGH (ref 70–99)
Glucose-Capillary: 233 mg/dL — ABNORMAL HIGH (ref 70–99)

## 2020-05-25 SURGERY — CHOLECYSTECTOMY, ROBOT-ASSISTED, LAPAROSCOPIC
Anesthesia: General | Site: Abdomen

## 2020-05-25 MED ORDER — ORAL CARE MOUTH RINSE: 15.0000 mL | Freq: Once | OROMUCOSAL | Status: AC

## 2020-05-25 MED ORDER — PROPOFOL 10 MG/ML IV BOLUS
INTRAVENOUS | Status: AC
  Filled 2020-05-25: qty 20

## 2020-05-25 MED ORDER — CHLORHEXIDINE GLUCONATE 0.12 % MT SOLN: 15.0000 mL | Freq: Once | OROMUCOSAL | Status: AC

## 2020-05-25 MED ORDER — ROCURONIUM BROMIDE 100 MG/10ML IV SOLN
INTRAVENOUS | Status: DC | PRN
  Administered 2020-05-25: 50 mg via INTRAVENOUS
  Administered 2020-05-25: 10 mg via INTRAVENOUS

## 2020-05-25 MED ORDER — CEFAZOLIN SODIUM-DEXTROSE 2-4 GM/100ML-% IV SOLN
INTRAVENOUS | Status: AC
  Filled 2020-05-25: qty 100

## 2020-05-25 MED ORDER — ONDANSETRON HCL 4 MG/2ML IJ SOLN
INTRAMUSCULAR | Status: DC | PRN
  Administered 2020-05-25: 4 mg via INTRAVENOUS

## 2020-05-25 MED ORDER — LACTATED RINGERS IV SOLN: INTRAVENOUS | Status: DC | PRN

## 2020-05-25 MED ORDER — OXYCODONE HCL 5 MG PO TABS
ORAL_TABLET | ORAL | Status: AC
  Filled 2020-05-25: qty 1

## 2020-05-25 MED ORDER — SODIUM CHLORIDE 0.9 % IV SOLN
INTRAVENOUS | Status: DC | PRN
  Administered 2020-05-25: 50 ug/min via INTRAVENOUS

## 2020-05-25 MED ORDER — BUPIVACAINE-EPINEPHRINE (PF) 0.25% -1:200000 IJ SOLN
INTRAMUSCULAR | Status: DC | PRN
  Administered 2020-05-25: 30 mL

## 2020-05-25 MED ORDER — OXYCODONE HCL 5 MG PO TABS: 5.0000 mg | ORAL_TABLET | Freq: Once | ORAL | Status: DC | PRN

## 2020-05-25 MED ORDER — OXYCODONE HCL 5 MG PO TABS: 5.0000 mg | ORAL_TABLET | ORAL | 0 refills | Status: DC | PRN

## 2020-05-25 MED ORDER — INSULIN ASPART 100 UNIT/ML ~~LOC~~ SOLN
SUBCUTANEOUS | Status: AC
  Filled 2020-05-25: qty 1

## 2020-05-25 MED ORDER — DEXAMETHASONE SODIUM PHOSPHATE 10 MG/ML IJ SOLN
INTRAMUSCULAR | Status: DC | PRN
  Administered 2020-05-25: 5 mg via INTRAVENOUS

## 2020-05-25 MED ORDER — ACETAMINOPHEN 500 MG PO TABS: 1000.0000 mg | ORAL_TABLET | ORAL | Status: AC

## 2020-05-25 MED ORDER — LIDOCAINE HCL (CARDIAC) PF 100 MG/5ML IV SOSY
PREFILLED_SYRINGE | INTRAVENOUS | Status: DC | PRN
  Administered 2020-05-25: 100 mg via INTRAVENOUS

## 2020-05-25 MED ORDER — ACETAMINOPHEN 500 MG PO TABS
ORAL_TABLET | ORAL | Status: AC
  Administered 2020-05-25: 1000 mg via ORAL
  Filled 2020-05-25: qty 2

## 2020-05-25 MED ORDER — OXYCODONE HCL 5 MG/5ML PO SOLN: 5.0000 mg | Freq: Once | ORAL | Status: DC | PRN

## 2020-05-25 MED ORDER — SUGAMMADEX SODIUM 200 MG/2ML IV SOLN
INTRAVENOUS | Status: DC | PRN
  Administered 2020-05-25: 172.4 mg via INTRAVENOUS

## 2020-05-25 MED ORDER — HYDROMORPHONE HCL 1 MG/ML IJ SOLN
INTRAMUSCULAR | Status: DC | PRN
  Administered 2020-05-25 (×2): .2 mg via INTRAVENOUS
  Administered 2020-05-25: .4 mg via INTRAVENOUS
  Administered 2020-05-25: .2 mg via INTRAVENOUS

## 2020-05-25 MED ORDER — CHLORHEXIDINE GLUCONATE 0.12 % MT SOLN
OROMUCOSAL | Status: AC
  Administered 2020-05-25: 15 mL via OROMUCOSAL
  Filled 2020-05-25: qty 15

## 2020-05-25 MED ORDER — FENTANYL CITRATE (PF) 100 MCG/2ML IJ SOLN
25.0000 ug | INTRAMUSCULAR | Status: DC | PRN
  Administered 2020-05-25: 25 ug via INTRAVENOUS

## 2020-05-25 MED ORDER — FENTANYL CITRATE (PF) 100 MCG/2ML IJ SOLN
INTRAMUSCULAR | Status: AC
  Administered 2020-05-25: 25 ug via INTRAVENOUS
  Filled 2020-05-25: qty 2

## 2020-05-25 MED ORDER — SODIUM CHLORIDE 0.9 % IV SOLN: INTRAVENOUS | Status: DC

## 2020-05-25 MED ORDER — CEFAZOLIN SODIUM-DEXTROSE 2-4 GM/100ML-% IV SOLN
2.0000 g | INTRAVENOUS | Status: AC
  Administered 2020-05-25: 2 g via INTRAVENOUS

## 2020-05-25 MED ORDER — ACETAMINOPHEN 500 MG PO TABS: 1000.0000 mg | ORAL_TABLET | Freq: Four times a day (QID) | ORAL | Status: AC | PRN

## 2020-05-25 MED ORDER — GABAPENTIN 100 MG PO CAPS
100.0000 mg | ORAL_CAPSULE | ORAL | Status: AC
  Administered 2020-05-25: 100 mg via ORAL
  Filled 2020-05-25 (×2): qty 1

## 2020-05-25 MED ORDER — CHLORHEXIDINE GLUCONATE CLOTH 2 % EX PADS
6.0000 | MEDICATED_PAD | Freq: Once | CUTANEOUS | Status: AC
  Administered 2020-05-25: 6 via TOPICAL

## 2020-05-25 MED ORDER — FENTANYL CITRATE (PF) 100 MCG/2ML IJ SOLN
INTRAMUSCULAR | Status: DC | PRN
Start: 2020-05-25 — End: 2020-05-25
  Administered 2020-05-25 (×2): 50 ug via INTRAVENOUS

## 2020-05-25 MED ORDER — HYDROMORPHONE HCL 1 MG/ML IJ SOLN
INTRAMUSCULAR | Status: AC
  Filled 2020-05-25: qty 1

## 2020-05-25 MED ORDER — OXYCODONE HCL 5 MG PO TABS
5.0000 mg | ORAL_TABLET | ORAL | Status: DC | PRN
  Administered 2020-05-25: 5 mg via ORAL
  Filled 2020-05-25: qty 1

## 2020-05-25 MED ORDER — GLYCOPYRROLATE 0.2 MG/ML IJ SOLN
INTRAMUSCULAR | Status: DC | PRN
  Administered 2020-05-25: .2 mg via INTRAVENOUS

## 2020-05-25 MED ORDER — FENTANYL CITRATE (PF) 100 MCG/2ML IJ SOLN
INTRAMUSCULAR | Status: AC
  Filled 2020-05-25: qty 2

## 2020-05-25 MED ORDER — FAMOTIDINE 20 MG PO TABS: 20.0000 mg | ORAL_TABLET | Freq: Once | ORAL | Status: AC

## 2020-05-25 MED ORDER — PROPOFOL 10 MG/ML IV BOLUS
INTRAVENOUS | Status: DC | PRN
  Administered 2020-05-25: 130 mg via INTRAVENOUS

## 2020-05-25 MED ORDER — PHENYLEPHRINE HCL (PRESSORS) 10 MG/ML IV SOLN
INTRAVENOUS | Status: DC | PRN
  Administered 2020-05-25: 100 ug via INTRAVENOUS
  Administered 2020-05-25: 50 ug via INTRAVENOUS
  Administered 2020-05-25 (×2): 100 ug via INTRAVENOUS

## 2020-05-25 MED ORDER — INSULIN ASPART 100 UNIT/ML ~~LOC~~ SOLN
5.0000 [IU] | Freq: Once | SUBCUTANEOUS | Status: AC
  Administered 2020-05-25: 5 [IU] via SUBCUTANEOUS

## 2020-05-25 MED ORDER — BUPIVACAINE-EPINEPHRINE (PF) 0.25% -1:200000 IJ SOLN
INTRAMUSCULAR | Status: AC
  Filled 2020-05-25: qty 30

## 2020-05-25 MED ORDER — FAMOTIDINE 20 MG PO TABS
ORAL_TABLET | ORAL | Status: AC
  Administered 2020-05-25: 20 mg via ORAL
  Filled 2020-05-25: qty 1

## 2020-05-25 SURGICAL SUPPLY — 60 items
ADH SKN CLS APL DERMABOND .7 (GAUZE/BANDAGES/DRESSINGS) ×1
APL PRP STRL LF DISP 70% ISPRP (MISCELLANEOUS)
BAG INFUSER PRESSURE 100CC (MISCELLANEOUS) ×3 IMPLANT
BAG SPEC RTRVL LRG 6X4 10 (ENDOMECHANICALS) ×1
CANISTER SUCT 1200ML W/VALVE (MISCELLANEOUS) ×3 IMPLANT
CANNULA REDUC XI 12-8 STAPL (CANNULA) ×1
CANNULA REDUC XI 12-8MM STAPL (CANNULA) ×1
CANNULA REDUCER 12-8 DVNC XI (CANNULA) ×1 IMPLANT
CHLORAPREP W/TINT 26 (MISCELLANEOUS) IMPLANT
CLIP VESOLOCK MED LG 6/CT (CLIP) ×3 IMPLANT
COVER WAND RF STERILE (DRAPES) ×3 IMPLANT
CUP MEDICINE 2OZ PLAST GRAD ST (MISCELLANEOUS) ×3 IMPLANT
DECANTER SPIKE VIAL GLASS SM (MISCELLANEOUS) ×3 IMPLANT
DEFOGGER SCOPE WARMER CLEARIFY (MISCELLANEOUS) ×3 IMPLANT
DERMABOND ADVANCED (GAUZE/BANDAGES/DRESSINGS) ×2
DERMABOND ADVANCED .7 DNX12 (GAUZE/BANDAGES/DRESSINGS) ×1 IMPLANT
DRAPE ARM DVNC X/XI (DISPOSABLE) ×4 IMPLANT
DRAPE COLUMN DVNC XI (DISPOSABLE) ×1 IMPLANT
DRAPE DA VINCI XI ARM (DISPOSABLE) ×8
DRAPE DA VINCI XI COLUMN (DISPOSABLE) ×2
DURAPREP 26ML APPLICATOR (WOUND CARE) ×3 IMPLANT
ELECT CAUTERY BLADE 6.4 (BLADE) ×3 IMPLANT
ELECT REM PT RETURN 9FT ADLT (ELECTROSURGICAL) ×3
ELECTRODE REM PT RTRN 9FT ADLT (ELECTROSURGICAL) ×1 IMPLANT
GLOVE SURG SYN 7.0 (GLOVE) ×6 IMPLANT
GLOVE SURG SYN 7.5  E (GLOVE) ×4
GLOVE SURG SYN 7.5 E (GLOVE) ×2 IMPLANT
GOWN STRL REUS W/ TWL LRG LVL3 (GOWN DISPOSABLE) ×4 IMPLANT
GOWN STRL REUS W/TWL LRG LVL3 (GOWN DISPOSABLE) ×12
IRRIGATOR SUCT 8 DISP DVNC XI (IRRIGATION / IRRIGATOR) ×1 IMPLANT
IRRIGATOR SUCTION 8MM XI DISP (IRRIGATION / IRRIGATOR) ×2
IV NS 1000ML (IV SOLUTION) ×3
IV NS 1000ML BAXH (IV SOLUTION) ×1 IMPLANT
KIT PINK PAD W/HEAD ARE REST (MISCELLANEOUS) ×3
KIT PINK PAD W/HEAD ARM REST (MISCELLANEOUS) ×1 IMPLANT
LABEL OR SOLS (LABEL) ×3 IMPLANT
NEEDLE HYPO 22GX1.5 SAFETY (NEEDLE) ×3 IMPLANT
NS IRRIG 500ML POUR BTL (IV SOLUTION) ×3 IMPLANT
OBTURATOR OPTICAL STANDARD 8MM (TROCAR) ×2
OBTURATOR OPTICAL STND 8 DVNC (TROCAR) ×1
OBTURATOR OPTICALSTD 8 DVNC (TROCAR) ×1 IMPLANT
PACK LAP CHOLECYSTECTOMY (MISCELLANEOUS) ×3 IMPLANT
PENCIL ELECTRO HAND CTR (MISCELLANEOUS) ×3 IMPLANT
POUCH SPECIMEN RETRIEVAL 10MM (ENDOMECHANICALS) ×3 IMPLANT
SEAL CANN UNIV 5-8 DVNC XI (MISCELLANEOUS) ×4 IMPLANT
SEAL XI 5MM-8MM UNIVERSAL (MISCELLANEOUS) ×8
SET TUBE SMOKE EVAC HIGH FLOW (TUBING) ×3 IMPLANT
SOLUTION ELECTROLUBE (MISCELLANEOUS) ×3 IMPLANT
SPONGE LAP 18X18 RF (DISPOSABLE) ×3 IMPLANT
SPONGE LAP 4X18 RFD (DISPOSABLE) ×3 IMPLANT
STAPLER CANNULA SEAL DVNC XI (STAPLE) ×1 IMPLANT
STAPLER CANNULA SEAL XI (STAPLE) ×2
SUT MNCRL AB 4-0 PS2 18 (SUTURE) ×3 IMPLANT
SUT VIC AB 2-0 SH 27 (SUTURE) ×3
SUT VIC AB 2-0 SH 27XBRD (SUTURE) ×1 IMPLANT
SUT VIC AB 3-0 SH 27 (SUTURE) ×3
SUT VIC AB 3-0 SH 27X BRD (SUTURE) ×1 IMPLANT
SUT VICRYL 0 AB UR-6 (SUTURE) ×3 IMPLANT
TAPE TRANSPORE STRL 2 31045 (GAUZE/BANDAGES/DRESSINGS) ×3 IMPLANT
TROCAR BALLN GELPORT 12X130M (ENDOMECHANICALS) ×3 IMPLANT

## 2020-05-25 NOTE — Op Note (Signed)
Procedure Date:  05/25/2020  Pre-operative Diagnosis:  Acute cholecystitis  Post-operative Diagnosis:  Acute cholecystitis, umbilical hernia  Procedure:  Robotic assisted cholecystectomy with attempted ICG FireFly cholangiogram; open umbilical hernia repair.  Surgeon:  Melvyn Neth, MD  Anesthesia:  General endotracheal  Estimated Blood Loss:  25 ml  Specimens:  gallbladder  Complications:  None  Indications for Procedure:  This is a 74 y.o. male who presents with abdominal pain and workup revealing acute cholecystitis, s/p prior cholecystostomy tube placement.  The benefits, complications, treatment options, and expected outcomes were discussed with the patient. The risks of bleeding, infection, recurrence of symptoms, failure to resolve symptoms, bile duct damage, bile duct leak, retained common bile duct stone, bowel injury, and need for further procedures were all discussed with the patient and he was willing to proceed.    Description of Procedure: The patient was correctly identified in the preoperative area and brought into the operating room.  The patient was placed supine with VTE prophylaxis in place.  Appropriate time-outs were performed.  Anesthesia was induced and the patient was intubated.  Appropriate antibiotics were infused.  The patient's prior cholecystostomy drain was removed.  The abdomen was prepped and draped in a sterile fashion. A supraumbilical incision was made, and cautery was used to dissect down along the umbilical stalk.  The patient was noted to have a small umbilical hernia.  The stalk was detached from the fascia, revealing a 5 mm hernia defect.  The fascial edges were cleaned using cautery, and the defect itself was extended superiorly using cautery. A 12 mm robotic trocar was inserted.  Pneumoperitoneum was obtained with appropriate opening pressures.  Three 8-mm ports were placed in the mid abdomen at the level of the umbilicus under direct  visualization.  The DaVinci platform was docked, camera targeted, and instruments placed under direct visualization.  The gallbladder was identified.  The fundus was grasped and retracted cephalad.  Adhesions were lysed bluntly and with electrocautery. The infundibulum was grasped and retracted laterally, exposing the peritoneum overlying the gallbladder.  There was a significant amount of peritoneum and fat in this area.  Careful dissection was carried down in this area to identify the critical structures.  FireFly cholangiogram was attempted, but there was no highlight of the cystic duct or common bile duct.  The cystic artery was identified first by its pulsation and dissected free.  Then I proceeded the dissect the body of the gallbladder off the liver bed in a top down approach to go down all the way to the cystic duct.  The duct was then identified.  The cystic duct and cystic artery were clipped twice proximally and once distally, cutting in between.  The superior portion of the gallbladder was taken from the gallbladder fossa in a retrograde fashion with electrocautery. The gallbladder was placed in an Endocatch bag. The liver bed was inspected and any bleeding was controlled with electrocautery. The right upper quadrant was then inspected again revealing intact clips, no bleeding, and no ductal injury.  The area was thoroughly irrigated.  The DaVinci platform was undocked and instruments removed.  The 8 mm ports were removed under direct visualization and the 12 mm trocar was removed.  The Endocatch bag was brought out via the umbilical incision. The hernia defect was closed using 0 vicryl suture.  The umbilical stalk was reattached using 2-0 vicryl.  Local anesthetic was infused in all incisions and the incisions were closed with 4-0 Monocryl.  The wounds  were cleaned and sealed with DermaBond.  The patient was emerged from anesthesia and extubated and brought to the recovery room for further  management.  The patient tolerated the procedure well and all counts were correct at the end of the case.   Melvyn Neth, MD

## 2020-05-25 NOTE — Anesthesia Procedure Notes (Signed)
Procedure Name: Intubation Date/Time: 05/25/2020 8:54 AM Performed by: Willette Alma, CRNA Pre-anesthesia Checklist: Patient identified, Patient being monitored, Timeout performed, Emergency Drugs available and Suction available Patient Re-evaluated:Patient Re-evaluated prior to induction Oxygen Delivery Method: Circle system utilized Preoxygenation: Pre-oxygenation with 100% oxygen Induction Type: IV induction Ventilation: Mask ventilation without difficulty Laryngoscope Size: McGraph and 4 Grade View: Grade I Tube type: Oral Tube size: 7.5 mm Number of attempts: 1 Airway Equipment and Method: Stylet Placement Confirmation: ETT inserted through vocal cords under direct vision,  positive ETCO2 and breath sounds checked- equal and bilateral Secured at: 22 cm Tube secured with: Tape Dental Injury: Teeth and Oropharynx as per pre-operative assessment

## 2020-05-25 NOTE — OR Nursing (Signed)
Percutaneous cholecystectomy tube removed prior to procedure by Dr. Hampton Abbot.

## 2020-05-25 NOTE — Anesthesia Preprocedure Evaluation (Signed)
Anesthesia Evaluation  Patient identified by MRN, date of birth, ID band Patient awake    Reviewed: Allergy & Precautions, H&P , NPO status , Patient's Chart, lab work & pertinent test results  History of Anesthesia Complications Negative for: history of anesthetic complications  Airway Mallampati: III  TM Distance: <3 FB Neck ROM: limited    Dental  (+) Chipped, Poor Dentition, Missing   Pulmonary shortness of breath and with exertion, COPD, Patient abstained from smoking., former smoker,    Pulmonary exam normal        Cardiovascular Exercise Tolerance: Good hypertension, + CAD and +CHF  Normal cardiovascular exam     Neuro/Psych negative neurological ROS  negative psych ROS   GI/Hepatic negative GI ROS, Neg liver ROS,   Endo/Other  diabetes, Type 2  Renal/GU CRFRenal disease     Musculoskeletal  (+) Arthritis ,   Abdominal   Peds  Hematology negative hematology ROS (+)   Anesthesia Other Findings Patient has cardiac clearance for this procedure.   Past Medical History: No date: Arthritis No date: Chronic kidney disease     Comment:  per patient has stage 3 kidney failure  No date: Coronary artery disease     Comment:  a. STEMI 03/2016 DESx1 to LCx, DES x 1 Mid LAD, DES x1               prox RCA No date: Depression No date: Diabetes mellitus without complication ([facility])     Comment:  type 2 No date: Dyspnea 04/15/2016: History of acute inferior wall myocardial infarction     Comment:  inf-lat/post >> PCI with DES of LCx; staged PCI of LAD               and RCA No date: Hypercholesteremia No date: Hypertension 04/19/2016: Ischemic cardiomyopathy     Comment:  A. Inf-lat/post STEMI 7/17 >> b. Echo 04/17/16: Septal               and post lateral HK, poor image quality, EF 35-40% // b.               Echo 11/17: mild LVH, EF 50-55, inf-lat and ant-lat HK,               Gr 1 DD, borderline dilated aortic root  (37 mm), MAC No date: Leg pain     Comment:  ABIs 3/19:  normal  Past Surgical History: 04/15/2016: CARDIAC CATHETERIZATION; N/A     Comment:  Procedure: Left Heart Cath and Coronary Angiography;                Surgeon: Sherren Mocha, MD;  Location: [place] [facility];  Service: Cardiovascular;  Laterality: N/A; 04/15/2016: CARDIAC CATHETERIZATION; N/A     Comment:  Procedure: Coronary Stent Intervention;  Surgeon:               Sherren Mocha, MD;  Location: [place] [facility];                Service: Cardiovascular;  Laterality: N/A; 04/17/2016: CARDIAC CATHETERIZATION; N/A     Comment:  Procedure: Coronary Stent Intervention;  Surgeon:               Burnell Blanks, MD;  Location: [place] [facility];  Service: Cardiovascular;  Laterality: N/A;  04/18/2016: CARDIAC CATHETERIZATION; N/A     Comment:  Procedure: Coronary Stent Intervention;  Surgeon:               Burnell Blanks, MD;  Location: [place] [facility]               [facility];  Service: Cardiovascular;  Laterality: N/A; 04/18/2016: CARDIAC CATHETERIZATION; N/A     Comment:  Procedure: Temporary Pacemaker;  Surgeon: Burnell Blanks, MD;  Location: [place] [facility];  Service:               Cardiovascular;  Laterality: N/A; 04/17/2016: CORONARY STENT PLACEMENT     Comment:   Severe stenosis proximal LAD, now s/p successful               PTCA/DES x 1 proximal and mid LAD 1974: COSMETIC SURGERY     Comment:  right side facial      No date: SHOULDER SURGERY  BMI    Body Mass Index: 29.76 kg/m      Reproductive/Obstetrics negative OB ROS                             Anesthesia Physical Anesthesia Plan  ASA: III  Anesthesia Plan: General ETT   Post-op Pain Management:    Induction: Intravenous  PONV Risk Score and Plan: Ondansetron, Dexamethasone, Midazolam and Treatment may vary due to age or medical condition  Airway Management Planned:  Oral ETT  Additional Equipment:   Intra-op Plan:   Post-operative Plan: Extubation in OR  Informed Consent: I have reviewed the patients History and Physical, chart, labs and discussed the procedure including the risks, benefits and alternatives for the proposed anesthesia with the patient or authorized representative who has indicated his/her understanding and acceptance.     Dental Advisory Given  Plan Discussed with: Anesthesiologist, CRNA and Surgeon  Anesthesia Plan Comments: (Patient consented for risks of anesthesia including but not limited to:  - adverse reactions to medications - damage to eyes, teeth, lips or other oral mucosa - nerve damage due to positioning  - sore throat or hoarseness - Damage to heart, brain, nerves, lungs, other parts of body or loss of life  Patient voiced understanding.)        Anesthesia Quick Evaluation

## 2020-05-25 NOTE — Transfer of Care (Signed)
Immediate Anesthesia Transfer of Care Note  Patient: Gilbert Obrien  Procedure(s) Performed: XI ROBOTIC ASSISTED LAPAROSCOPIC CHOLECYSTECTOMY (N/A Abdomen)  Patient Location: PACU  Anesthesia Type:General  Level of Consciousness: awake, alert  and oriented  Airway & Oxygen Therapy: Patient Spontanous Breathing and Patient connected to face mask oxygen  Post-op Assessment: Report given to RN and Post -op Vital signs reviewed and stable  Post vital signs: Reviewed and stable  Last Vitals:  Vitals Value Taken Time  BP 138/64 05/25/20 1158  Temp 36.7 C 05/25/20 1158  Pulse 62 05/25/20 1202  Resp 20 05/25/20 1202  SpO2 100 % 05/25/20 1202  Vitals shown include unvalidated device data.  Last Pain:  Vitals:   05/25/20 0744  TempSrc: Tympanic  PainSc: 0-No pain         Complications: No complications documented.

## 2020-05-25 NOTE — Anesthesia Postprocedure Evaluation (Signed)
Anesthesia Post Note  Patient: Gilbert Obrien  Procedure(s) Performed: XI ROBOTIC ASSISTED LAPAROSCOPIC CHOLECYSTECTOMY (N/A Abdomen)  Patient location during evaluation: PACU Anesthesia Type: General Level of consciousness: awake and alert Pain management: pain level controlled Vital Signs Assessment: post-procedure vital signs reviewed and stable Respiratory status: spontaneous breathing, nonlabored ventilation, respiratory function stable and patient connected to nasal cannula oxygen Cardiovascular status: blood pressure returned to baseline and stable Postop Assessment: no apparent nausea or vomiting   No complications documented.   Last Vitals:  Vitals:   05/25/20 1258 05/25/20 1313  BP: 129/63 137/64  Pulse: 66 69  Resp: 11 17  Temp:  36.5 C  SpO2: 95% 95%    Last Pain:  Vitals:   05/25/20 1313  TempSrc:   PainSc: 1                  Precious Haws Bobbie Virden

## 2020-05-25 NOTE — Interval H&P Note (Signed)
History and Physical Interval Note:  05/25/2020 8:29 AM  Gilbert Obrien  has presented today for surgery, with the diagnosis of Cholecystitis.  The various methods of treatment have been discussed with the patient and family. After consideration of risks, benefits and other options for treatment, the patient has consented to  Procedure(s): XI ROBOTIC [place] (N/A) as a surgical intervention.  The patient's history has been reviewed, patient examined, no change in status, stable for surgery.  I have reviewed the patient's chart and labs.  Questions were answered to the patient's satisfaction.     [name]

## 2020-05-25 NOTE — Discharge Instructions (Signed)

## 2020-05-26 LAB — SURGICAL PATHOLOGY

## 2020-06-09 ENCOUNTER — Encounter: Payer: Medicare HMO | Admitting: Physician Assistant

## 2020-06-24 ENCOUNTER — Inpatient Hospital Stay
Admission: EM | Admit: 2020-06-24 | Discharge: 2020-07-02 | DRG: 177 | Disposition: A | Discharge status: Home Health | Payer: Medicare HMO | Attending: Internal Medicine | Admitting: Internal Medicine

## 2020-06-24 ENCOUNTER — Other Ambulatory Visit: Payer: Self-pay

## 2020-06-24 ENCOUNTER — Emergency Department: Payer: Medicare HMO

## 2020-06-24 DIAGNOSIS — N179 Acute kidney failure, unspecified: Secondary | ICD-10-CM | POA: Diagnosis not present

## 2020-06-24 DIAGNOSIS — I255 Ischemic cardiomyopathy: Secondary | ICD-10-CM | POA: Diagnosis present

## 2020-06-24 DIAGNOSIS — J1282 Pneumonia due to coronavirus disease 2019: Secondary | ICD-10-CM | POA: Diagnosis present

## 2020-06-24 DIAGNOSIS — E785 Hyperlipidemia, unspecified: Secondary | ICD-10-CM | POA: Diagnosis not present

## 2020-06-24 DIAGNOSIS — I252 Old myocardial infarction: Secondary | ICD-10-CM | POA: Diagnosis not present

## 2020-06-24 DIAGNOSIS — Z7982 Long term (current) use of aspirin: Secondary | ICD-10-CM

## 2020-06-24 DIAGNOSIS — F329 Major depressive disorder, single episode, unspecified: Secondary | ICD-10-CM | POA: Diagnosis not present

## 2020-06-24 DIAGNOSIS — Z888 Allergy status to other drugs, medicaments and biological substances status: Secondary | ICD-10-CM

## 2020-06-24 DIAGNOSIS — E1122 Type 2 diabetes mellitus with diabetic chronic kidney disease: Secondary | ICD-10-CM | POA: Diagnosis not present

## 2020-06-24 DIAGNOSIS — Z882 Allergy status to sulfonamides status: Secondary | ICD-10-CM

## 2020-06-24 DIAGNOSIS — E1169 Type 2 diabetes mellitus with other specified complication: Secondary | ICD-10-CM | POA: Diagnosis present

## 2020-06-24 DIAGNOSIS — M1990 Unspecified osteoarthritis, unspecified site: Secondary | ICD-10-CM | POA: Diagnosis present

## 2020-06-24 DIAGNOSIS — I2510 Atherosclerotic heart disease of native coronary artery without angina pectoris: Secondary | ICD-10-CM | POA: Diagnosis present

## 2020-06-24 DIAGNOSIS — R0902 Hypoxemia: Secondary | ICD-10-CM | POA: Diagnosis not present

## 2020-06-24 DIAGNOSIS — Z8249 Family history of ischemic heart disease and other diseases of the circulatory system: Secondary | ICD-10-CM

## 2020-06-24 DIAGNOSIS — I130 Hypertensive heart and chronic kidney disease with heart failure and stage 1 through stage 4 chronic kidney disease, or unspecified chronic kidney disease: Secondary | ICD-10-CM | POA: Diagnosis present

## 2020-06-24 DIAGNOSIS — N1831 Chronic kidney disease, stage 3a: Secondary | ICD-10-CM | POA: Diagnosis present

## 2020-06-24 DIAGNOSIS — F32A Depression, unspecified: Secondary | ICD-10-CM | POA: Diagnosis present

## 2020-06-24 DIAGNOSIS — Z87891 Personal history of nicotine dependence: Secondary | ICD-10-CM | POA: Diagnosis not present

## 2020-06-24 DIAGNOSIS — Z955 Presence of coronary angioplasty implant and graft: Secondary | ICD-10-CM

## 2020-06-24 DIAGNOSIS — U071 COVID-19: Principal | ICD-10-CM | POA: Diagnosis present

## 2020-06-24 DIAGNOSIS — Z289 Immunization not carried out for unspecified reason: Secondary | ICD-10-CM

## 2020-06-24 DIAGNOSIS — Z87892 Personal history of anaphylaxis: Secondary | ICD-10-CM | POA: Diagnosis not present

## 2020-06-24 DIAGNOSIS — J9601 Acute respiratory failure with hypoxia: Secondary | ICD-10-CM

## 2020-06-24 DIAGNOSIS — I5032 Chronic diastolic (congestive) heart failure: Secondary | ICD-10-CM | POA: Diagnosis present

## 2020-06-24 DIAGNOSIS — Z79899 Other long term (current) drug therapy: Secondary | ICD-10-CM | POA: Diagnosis not present

## 2020-06-24 DIAGNOSIS — E1129 Type 2 diabetes mellitus with other diabetic kidney complication: Secondary | ICD-10-CM | POA: Diagnosis present

## 2020-06-24 DIAGNOSIS — I10 Essential (primary) hypertension: Secondary | ICD-10-CM | POA: Diagnosis not present

## 2020-06-24 DIAGNOSIS — R0602 Shortness of breath: Secondary | ICD-10-CM

## 2020-06-24 DIAGNOSIS — I152 Hypertension secondary to endocrine disorders: Secondary | ICD-10-CM | POA: Diagnosis present

## 2020-06-24 DIAGNOSIS — N183 Chronic kidney disease, stage 3 unspecified: Secondary | ICD-10-CM | POA: Diagnosis present

## 2020-06-24 HISTORY — DX: COVID-19: U07.1

## 2020-06-24 LAB — PROCALCITONIN: Procalcitonin: <0.1 ng/mL

## 2020-06-24 LAB — CBC WITH DIFFERENTIAL/PLATELET
Abs Immature Granulocytes: 0.08 10*3/uL — ABNORMAL HIGH (ref 0.00–0.07)
Basophils Absolute: 0.1 10*3/uL (ref 0.0–0.1)
Basophils Relative: 1 %
Eosinophils Absolute: 0.3 10*3/uL (ref 0.0–0.5)
Eosinophils Relative: 4 %
HCT: 35.9 % — ABNORMAL LOW (ref 39.0–52.0)
Hemoglobin: 12.3 g/dL — ABNORMAL LOW (ref 13.0–17.0)
Immature Granulocytes: 1 %
Lymphocytes Relative: 14 %
Lymphs Abs: 1 10*3/uL (ref 0.7–4.0)
MCH: 28.5 pg (ref 26.0–34.0)
MCHC: 34.3 g/dL (ref 30.0–36.0)
MCV: 83.1 fL (ref 80.0–100.0)
Monocytes Absolute: 0.6 10*3/uL (ref 0.1–1.0)
Monocytes Relative: 9 %
Neutro Abs: 5.1 10*3/uL (ref 1.7–7.7)
Neutrophils Relative %: 71 %
Platelets: 316 10*3/uL (ref 150–400)
RBC: 4.32 MIL/uL (ref 4.22–5.81)
RDW: 13.8 % (ref 11.5–15.5)
WBC: 7.2 10*3/uL (ref 4.0–10.5)
nRBC: 0 % (ref 0.0–0.2)

## 2020-06-24 LAB — BASIC METABOLIC PANEL
Anion gap: 9 (ref 5–15)
BUN: 16 mg/dL (ref 8–23)
CO2: 25 mmol/L (ref 22–32)
Calcium: 8.6 mg/dL — ABNORMAL LOW (ref 8.9–10.3)
Chloride: 105 mmol/L (ref 98–111)
Creatinine, Ser: 1.41 mg/dL — ABNORMAL HIGH (ref 0.61–1.24)
GFR calc Af Amer: 56 mL/min — ABNORMAL LOW (ref >60)
GFR calc non Af Amer: 48 mL/min — ABNORMAL LOW (ref >60)
Glucose, Bld: 235 mg/dL — ABNORMAL HIGH (ref 70–99)
Potassium: 3.9 mmol/L (ref 3.5–5.1)
Sodium: 139 mmol/L (ref 135–145)

## 2020-06-24 LAB — LACTATE DEHYDROGENASE: LDH: 195 U/L — ABNORMAL HIGH (ref 98–192)

## 2020-06-24 LAB — TROPONIN I (HIGH SENSITIVITY)
Troponin I (High Sensitivity): 8 ng/L (ref <18)
Troponin I (High Sensitivity): 9 ng/L (ref <18)

## 2020-06-24 LAB — RESPIRATORY PANEL BY RT PCR (FLU A&B, COVID)
Influenza A by PCR: NEGATIVE
Influenza B by PCR: NEGATIVE
SARS Coronavirus 2 by RT PCR: POSITIVE — AB

## 2020-06-24 LAB — FIBRIN DERIVATIVES D-DIMER (ARMC ONLY): Fibrin derivatives D-dimer (ARMC): 2050.32 ng/mL (FEU) — ABNORMAL HIGH (ref 0.00–499.00)

## 2020-06-24 LAB — FIBRINOGEN: Fibrinogen: >750 mg/dL — ABNORMAL HIGH (ref 210–475)

## 2020-06-24 LAB — FERRITIN: Ferritin: 450 ng/mL — ABNORMAL HIGH (ref 24–336)

## 2020-06-24 LAB — GLUCOSE, CAPILLARY
Glucose-Capillary: 125 mg/dL — ABNORMAL HIGH (ref 70–99)
Glucose-Capillary: 279 mg/dL — ABNORMAL HIGH (ref 70–99)

## 2020-06-24 LAB — HEPATITIS B SURFACE ANTIGEN: Hepatitis B Surface Ag: NONREACTIVE

## 2020-06-24 MED ORDER — IPRATROPIUM-ALBUTEROL 20-100 MCG/ACT IN AERS
1.0000 | INHALATION_SPRAY | Freq: Four times a day (QID) | RESPIRATORY_TRACT | Status: DC
  Administered 2020-06-24 – 2020-07-02 (×31): 1 via RESPIRATORY_TRACT
  Filled 2020-06-24: qty 4

## 2020-06-24 MED ORDER — ZINC SULFATE 220 (50 ZN) MG PO CAPS
220.0000 mg | ORAL_CAPSULE | Freq: Every day | ORAL | Status: DC
  Administered 2020-06-24 – 2020-07-02 (×9): 220 mg via ORAL
  Filled 2020-06-24 (×9): qty 1

## 2020-06-24 MED ORDER — FUROSEMIDE 20 MG PO TABS
20.0000 mg | ORAL_TABLET | Freq: Every day | ORAL | Status: DC
  Administered 2020-06-25 – 2020-06-30 (×6): 20 mg via ORAL
  Filled 2020-06-24 (×8): qty 1

## 2020-06-24 MED ORDER — TEMAZEPAM 7.5 MG PO CAPS
15.0000 mg | ORAL_CAPSULE | Freq: Every evening | ORAL | Status: DC | PRN
  Filled 2020-06-24: qty 1

## 2020-06-24 MED ORDER — GUAIFENESIN-DM 100-10 MG/5ML PO SYRP
10.0000 mL | ORAL_SOLUTION | ORAL | Status: DC | PRN
  Filled 2020-06-24: qty 10

## 2020-06-24 MED ORDER — BISACODYL 5 MG PO TBEC
5.0000 mg | DELAYED_RELEASE_TABLET | Freq: Every day | ORAL | Status: DC | PRN
  Filled 2020-06-24 (×2): qty 1

## 2020-06-24 MED ORDER — SODIUM CHLORIDE 0.9 % IV SOLN
200.0000 mg | Freq: Once | INTRAVENOUS | Status: AC
  Administered 2020-06-24: 200 mg via INTRAVENOUS
  Filled 2020-06-24: qty 200

## 2020-06-24 MED ORDER — ATORVASTATIN CALCIUM 20 MG PO TABS
80.0000 mg | ORAL_TABLET | Freq: Every day | ORAL | Status: DC
  Administered 2020-06-24 – 2020-07-01 (×8): 80 mg via ORAL
  Filled 2020-06-24 (×8): qty 4

## 2020-06-24 MED ORDER — SODIUM CHLORIDE 0.9 % IV SOLN
INTRAVENOUS | Status: DC
  Administered 2020-06-25: 15:00:00 75 mL via INTRAVENOUS

## 2020-06-24 MED ORDER — ONDANSETRON HCL 4 MG PO TABS: 4.0000 mg | ORAL_TABLET | Freq: Four times a day (QID) | ORAL | Status: DC | PRN

## 2020-06-24 MED ORDER — ACETAMINOPHEN 325 MG PO TABS: 650.0000 mg | ORAL_TABLET | Freq: Four times a day (QID) | ORAL | Status: DC | PRN

## 2020-06-24 MED ORDER — ASCORBIC ACID 500 MG PO TABS
500.0000 mg | ORAL_TABLET | Freq: Every day | ORAL | Status: DC
  Administered 2020-06-24 – 2020-07-02 (×9): 500 mg via ORAL
  Filled 2020-06-24 (×9): qty 1

## 2020-06-24 MED ORDER — INSULIN ASPART 100 UNIT/ML ~~LOC~~ SOLN
0.0000 [IU] | SUBCUTANEOUS | Status: DC
  Administered 2020-06-24: 2 [IU] via SUBCUTANEOUS
  Administered 2020-06-24: 8 [IU] via SUBCUTANEOUS
  Administered 2020-06-25: 12:00:00 5 [IU] via SUBCUTANEOUS
  Administered 2020-06-25 (×2): 3 [IU] via SUBCUTANEOUS
  Administered 2020-06-25: 5 [IU] via SUBCUTANEOUS
  Administered 2020-06-25: 8 [IU] via SUBCUTANEOUS
  Administered 2020-06-25: 21:00:00 3 [IU] via SUBCUTANEOUS
  Administered 2020-06-26: 06:00:00 2 [IU] via SUBCUTANEOUS
  Administered 2020-06-26 (×2): 5 [IU] via SUBCUTANEOUS
  Administered 2020-06-26: 8 [IU] via SUBCUTANEOUS
  Administered 2020-06-26: 2 [IU] via SUBCUTANEOUS
  Administered 2020-06-26: 8 [IU] via SUBCUTANEOUS
  Administered 2020-06-27: 02:00:00 3 [IU] via SUBCUTANEOUS
  Administered 2020-06-27 (×3): 5 [IU] via SUBCUTANEOUS
  Administered 2020-06-28: 3 [IU] via SUBCUTANEOUS
  Administered 2020-06-28: 2 [IU] via SUBCUTANEOUS
  Administered 2020-06-28: 17:00:00 8 [IU] via SUBCUTANEOUS
  Administered 2020-06-28 – 2020-06-29 (×2): 3 [IU] via SUBCUTANEOUS
  Administered 2020-06-29: 08:00:00 2 [IU] via SUBCUTANEOUS
  Administered 2020-06-29 (×2): 8 [IU] via SUBCUTANEOUS
  Administered 2020-06-29 (×2): 3 [IU] via SUBCUTANEOUS
  Administered 2020-06-30: 11 [IU] via SUBCUTANEOUS
  Administered 2020-06-30: 3 [IU] via SUBCUTANEOUS
  Administered 2020-06-30 (×2): 5 [IU] via SUBCUTANEOUS
  Administered 2020-06-30: 2 [IU] via SUBCUTANEOUS
  Administered 2020-07-01: 8 [IU] via SUBCUTANEOUS
  Administered 2020-07-01: 3 [IU] via SUBCUTANEOUS
  Filled 2020-06-24 (×35): qty 1

## 2020-06-24 MED ORDER — DEXAMETHASONE SODIUM PHOSPHATE 10 MG/ML IJ SOLN
6.0000 mg | INTRAMUSCULAR | Status: DC
  Administered 2020-06-25 – 2020-06-28 (×4): 6 mg via INTRAVENOUS
  Filled 2020-06-24 (×4): qty 1

## 2020-06-24 MED ORDER — SERTRALINE HCL 50 MG PO TABS
50.0000 mg | ORAL_TABLET | Freq: Every day | ORAL | Status: DC
  Administered 2020-06-25 – 2020-07-02 (×8): 50 mg via ORAL
  Filled 2020-06-24 (×9): qty 1

## 2020-06-24 MED ORDER — ONDANSETRON HCL 4 MG/2ML IJ SOLN: 4.0000 mg | Freq: Four times a day (QID) | INTRAMUSCULAR | Status: DC | PRN

## 2020-06-24 MED ORDER — ACETAMINOPHEN 500 MG PO TABS: 1000.0000 mg | ORAL_TABLET | Freq: Four times a day (QID) | ORAL | Status: DC | PRN

## 2020-06-24 MED ORDER — HYDROCORTISONE (PERIANAL) 2.5 % EX CREA
1.0000 "application " | TOPICAL_CREAM | Freq: Three times a day (TID) | CUTANEOUS | Status: DC | PRN
  Filled 2020-06-24: qty 28.35

## 2020-06-24 MED ORDER — SODIUM CHLORIDE 0.9 % IV SOLN: 100.0000 mg | Freq: Every day | INTRAVENOUS | Status: DC

## 2020-06-24 MED ORDER — SENNOSIDES-DOCUSATE SODIUM 8.6-50 MG PO TABS: 1.0000 | ORAL_TABLET | Freq: Every evening | ORAL | Status: DC | PRN

## 2020-06-24 MED ORDER — HYDROCOD POLST-CPM POLST ER 10-8 MG/5ML PO SUER: 5.0000 mL | Freq: Two times a day (BID) | ORAL | Status: DC | PRN

## 2020-06-24 MED ORDER — INSULIN DETEMIR 100 UNIT/ML ~~LOC~~ SOLN
0.1000 [IU]/kg | Freq: Two times a day (BID) | SUBCUTANEOUS | Status: DC
  Administered 2020-06-25 – 2020-06-30 (×12): 8 [IU] via SUBCUTANEOUS
  Filled 2020-06-24 (×16): qty 0.08

## 2020-06-24 MED ORDER — CARVEDILOL 25 MG PO TABS
25.0000 mg | ORAL_TABLET | Freq: Two times a day (BID) | ORAL | Status: DC
  Administered 2020-06-25 – 2020-07-02 (×15): 25 mg via ORAL
  Filled 2020-06-24 (×15): qty 1

## 2020-06-24 MED ORDER — AMLODIPINE BESYLATE 10 MG PO TABS
10.0000 mg | ORAL_TABLET | Freq: Every day | ORAL | Status: DC
  Administered 2020-06-24 – 2020-07-02 (×9): 10 mg via ORAL
  Filled 2020-06-24 (×3): qty 1
  Filled 2020-06-24: qty 2
  Filled 2020-06-24 (×5): qty 1

## 2020-06-24 MED ORDER — ENOXAPARIN SODIUM 40 MG/0.4ML ~~LOC~~ SOLN
40.0000 mg | SUBCUTANEOUS | Status: DC
  Administered 2020-06-24 – 2020-07-01 (×8): 40 mg via SUBCUTANEOUS
  Filled 2020-06-24 (×7): qty 0.4

## 2020-06-24 MED ORDER — LINAGLIPTIN 5 MG PO TABS
5.0000 mg | ORAL_TABLET | Freq: Every day | ORAL | Status: DC
  Administered 2020-06-24 – 2020-07-02 (×9): 5 mg via ORAL
  Filled 2020-06-24 (×11): qty 1

## 2020-06-24 MED ORDER — SODIUM CHLORIDE 0.9 % IV SOLN
100.0000 mg | Freq: Every day | INTRAVENOUS | Status: AC
  Administered 2020-06-25 – 2020-06-28 (×4): 100 mg via INTRAVENOUS
  Filled 2020-06-24 (×5): qty 20

## 2020-06-24 MED ORDER — DOCUSATE SODIUM 100 MG PO CAPS
100.0000 mg | ORAL_CAPSULE | Freq: Two times a day (BID) | ORAL | Status: DC
  Administered 2020-06-24 – 2020-07-02 (×16): 100 mg via ORAL
  Filled 2020-06-24 (×17): qty 1

## 2020-06-24 MED ORDER — RAMIPRIL 5 MG PO CAPS
5.0000 mg | ORAL_CAPSULE | Freq: Two times a day (BID) | ORAL | Status: DC
  Administered 2020-06-24 – 2020-07-02 (×16): 5 mg via ORAL
  Filled 2020-06-24 (×19): qty 1

## 2020-06-24 MED ORDER — SODIUM CHLORIDE 0.9 % IV SOLN: 200.0000 mg | Freq: Once | INTRAVENOUS | Status: DC

## 2020-06-24 MED ORDER — DEXAMETHASONE SODIUM PHOSPHATE 10 MG/ML IJ SOLN
6.0000 mg | Freq: Once | INTRAMUSCULAR | Status: AC
  Administered 2020-06-24: 6 mg via INTRAVENOUS
  Filled 2020-06-24: qty 1

## 2020-06-24 NOTE — ED Provider Notes (Signed)
[facility] Emergency Department Provider Note ____________________________________________   First MD Initiated Contact with Patient 06/24/20 1455     (approximate)  I have reviewed the triage vital signs and the nursing notes.  HISTORY  Chief Complaint Shortness of Breath   HPI Gilbert Obrien is a 74 y.o. malewho presents to the ED for evaluation of short of breath.   Chart review indicates CKD, HTN, HLD, DM, CAD. Recent cholecystectomy in July.   Patient is not vaccinated for COVID-19.  He reports he "will never get that damn vaccine."   Patient reports feeling ill for about 1 month, with shortness of breath, generalized weakness.  He reports worsening in the past 1 week with worsening shortness of breath, decreased activity level, lack of appetite and spiking fever/chills.  Denies any pain, chest pain, headache, abdominal pain, vomiting.  Denies any falls or trauma.  Denies productive cough.  Reports his dyspnea improves with rest.   Past Medical History:  Diagnosis Date  . Arthritis   . Chronic kidney disease    per patient has stage 3 kidney failure   . Coronary artery disease    a. STEMI 03/2016 DESx1 to LCx, DES x 1 Mid LAD, DES x1 prox RCA  . Depression   . Diabetes mellitus without complication ([place])    type 2  . Dyspnea   . History of acute inferior wall myocardial infarction 04/15/2016   inf-lat/post >> PCI with DES of LCx; staged PCI of LAD and RCA  . Hypercholesteremia   . Hypertension   . Ischemic cardiomyopathy 04/19/2016   A. Inf-lat/post STEMI 7/17 >> b. Echo 04/17/16: Septal and post lateral HK, poor image quality, EF 35-40% // b. Echo 11/17: mild LVH, EF 50-55, inf-lat and ant-lat HK, Gr 1 DD, borderline dilated aortic root (37 mm), MAC  . Leg pain    ABIs 3/19:  normal    Patient Active Problem List   Diagnosis Date Noted  . Umbilical hernia without obstruction and without gangrene   . Coronary artery disease   .  Depression   . Tobacco abuse   . Acute cholecystitis 03/25/2020  . Chronic diastolic CHF (congestive heart failure) ([place]) 11/23/2017  . Weakness   . History of stroke 10/03/2016  . History of CVA (cerebrovascular accident) 10/03/2016  . Abdominal pain   . Cholecystitis 08/15/2016  . Essential hypertension 05/02/2016  . Coronary artery disease involving native heart without angina pectoris 05/01/2016  . HLD (hyperlipidemia) 05/01/2016  . Hypokalemia 04/19/2016  . Cardiomyopathy, ischemic 04/19/2016  . Type II diabetes mellitus with renal manifestations ([place]) 04/19/2016  . CKD (chronic kidney disease), stage IIIa 04/19/2016  . Hematoma of arm 04/19/2016  . History of myocardial infarction 04/15/2016    Past Surgical History:  Procedure Laterality Date  . CARDIAC CATHETERIZATION N/A 04/15/2016   Procedure: Left Heart Cath and Coronary Angiography;  Surgeon: Sherren Mocha, MD;  Location: [place] [facility];  Service: Cardiovascular;  Laterality: N/A;  . CARDIAC CATHETERIZATION N/A 04/15/2016   Procedure: Coronary Stent Intervention;  Surgeon: Sherren Mocha, MD;  Location: [place] [facility];  Service: Cardiovascular;  Laterality: N/A;  . CARDIAC CATHETERIZATION N/A 04/17/2016   Procedure: Coronary Stent Intervention;  Surgeon: Burnell Blanks, MD;  Location: [place] [facility];  Service: Cardiovascular;  Laterality: N/A;  . CARDIAC CATHETERIZATION N/A 04/18/2016   Procedure: Coronary Stent Intervention;  Surgeon: Burnell Blanks, MD;  Location: [place] [facility];  Service: Cardiovascular;  Laterality:  N/A;  . CARDIAC CATHETERIZATION N/A 04/18/2016   Procedure: Temporary Pacemaker;  Surgeon: Burnell Blanks, MD;  Location: [place] [facility];  Service: Cardiovascular;  Laterality: N/A;  . CORONARY STENT PLACEMENT  04/17/2016    Severe stenosis proximal LAD, now s/p successful PTCA/DES x 1 proximal and mid LAD  . COSMETIC SURGERY  1974   right side facial       .  SHOULDER SURGERY      Prior to Admission medications   Medication Sig Start Date End Date Taking? Authorizing Provider  acetaminophen (TYLENOL) 500 MG tablet Take 2 tablets (1,000 mg total) by mouth every 6 (six) hours as needed for mild pain. 05/25/20 05/25/21  Olean Ree, MD  amLODipine (NORVASC) 10 MG tablet TAKE 1 TABLET EVERY DAY Patient taking differently: Take 10 mg by mouth daily.  04/02/20   Sherren Mocha, MD  aspirin EC 81 MG tablet Take 1 tablet (81 mg total) by mouth daily. 12/19/19   Sherren Mocha, MD  atorvastatin (LIPITOR) 80 MG tablet Take 1 tablet (80 mg total) by mouth daily at 6 PM. 04/19/16   Arbutus Leas, NP  carvedilol (COREG) 25 MG tablet Take 1 tablet (25 mg total) by mouth in the morning and at bedtime. 05/13/20   Sherren Mocha, MD  furosemide (LASIX) 20 MG tablet TAKE 1 TABLET EVERY DAY Patient taking differently: Take 20 mg by mouth at bedtime.  07/10/19   Daune Perch, NP  glimepiride (AMARYL) 2 MG tablet Take 2 mg by mouth in the morning and at bedtime.  10/17/17   [provider]  nitroGLYCERIN (NITROSTAT) 0.4 MG SL tablet Place 1 tablet (0.4 mg total) under the tongue every 5 (five) minutes x 3 doses as needed for chest pain. 09/11/17   Sherren Mocha, MD  oxyCODONE (OXY IR/ROXICODONE) 5 MG immediate release tablet Take 1 tablet (5 mg total) by mouth every 4 (four) hours as needed for severe pain. 05/25/20   Piscoya, Jacqulyn Bath, MD  PROCTO-MED HC 2.5 % rectal cream Place 1 application rectally 3 (three) times daily as needed (discomfort/hemorrhoids.).  04/02/20   [provider]  ramipril (ALTACE) 5 MG capsule TAKE 1 CAPSULE TWICE DAILY Patient taking differently: Take 5 mg by mouth in the morning and at bedtime.  09/22/19   Sherren Mocha, MD  sertraline (ZOLOFT) 50 MG tablet Take 50 mg by mouth daily.    [provider]  temazepam (RESTORIL) 15 MG capsule Take 15 mg by mouth at bedtime as needed for sleep.     [provider]     Allergies Celecoxib and Sulfa antibiotics  Family History  Problem Relation Age of Onset  . Heart attack Brother   . Heart disease Brother     Social History Social History   Tobacco Use  . Smoking status: Former Smoker    Quit date: 09/25/2000    Years since quitting: 19.7  . Smokeless tobacco: Never Used  . Tobacco comment: every few days last one 2 days ago  Vaping Use  . Vaping Use: Never used  Substance Use Topics  . Alcohol use: No  . Drug use: Yes    Types: Marijuana    Review of Systems  Constitutional: No fever/chills Eyes: No visual changes. ENT: No sore throat. Cardiovascular: Denies chest pain. Respiratory: Positive for shortness of breath. Gastrointestinal: No abdominal pain.  No nausea, no vomiting.  No diarrhea.  No constipation. Genitourinary: Negative for dysuria. Musculoskeletal: Negative for back pain. Skin: Negative  for rash. Neurological: Negative for headaches, focal weakness or numbness.   ____________________________________________   PHYSICAL EXAM:  VITAL SIGNS: Vitals:   06/24/20 1515 06/24/20 1521  BP: (!) 144/74   Pulse: 71   Resp: (!) 22   Temp:    SpO2: 100% 99%      Constitutional: Alert and oriented. Well appearing and in no acute distress. Eyes: Conjunctivae are normal. PERRL. EOMI. Head: Atraumatic. Nose: No congestion/rhinnorhea. Mouth/Throat: Mucous membranes are moist.  Oropharynx non-erythematous. Neck: No stridor. No cervical spine tenderness to palpation. Cardiovascular: Normal rate, regular rhythm. Grossly normal heart sounds.  Good peripheral circulation. Respiratory:   No retractions. Lungs CTAB.  Tachypneic to about 30.  Speaking only short phrases due to his dyspnea. Gastrointestinal: Soft , nondistended, nontender to palpation. No abdominal bruits. No CVA tenderness. Musculoskeletal: No lower extremity tenderness nor edema.  No joint effusions. No signs of acute trauma. Neurologic:  Normal speech and  language. No gross focal neurologic deficits are appreciated. No gait instability noted. Skin:  Skin is warm, dry and intact. No rash noted. Psychiatric: Mood and affect are normal. Speech and behavior are normal.  ____________________________________________   LABS (all labs ordered are listed, but only abnormal results are displayed)  Labs Reviewed  RESPIRATORY PANEL BY RT PCR (FLU A&B, COVID) - Abnormal; Notable for the following components:      Result Value   SARS Coronavirus 2 by RT PCR POSITIVE (*)    All other components within normal limits  BASIC METABOLIC PANEL - Abnormal; Notable for the following components:   Glucose, Bld 235 (*)    Creatinine, Ser 1.41 (*)    Calcium 8.6 (*)    GFR calc non Af Amer 48 (*)    GFR calc Af Amer 56 (*)    All other components within normal limits  CBC WITH DIFFERENTIAL/PLATELET - Abnormal; Notable for the following components:   Hemoglobin 12.3 (*)    HCT 35.9 (*)    Abs Immature Granulocytes 0.08 (*)    All other components within normal limits  TROPONIN I (HIGH SENSITIVITY)  TROPONIN I (HIGH SENSITIVITY)   ____________________________________________  12 Lead EKG  Sinus rhythm, Rate of 83 bpm.  Normal axis and intervals.  No evidence of acute ischemia. ____________________________________________  RADIOLOGY  ED MD interpretation: 2 view CXR reviewed with evidence of multifocal airspace opacities consistent with COVID-19.  No discrete lobar consolidation to suggest a bacterial pneumonia.  Official radiology report(s): DG Chest 2 View  Result Date: 06/24/2020 CLINICAL DATA:  Shortness of breath EXAM: CHEST - 2 VIEW COMPARISON:  March 24, 2020 FINDINGS: There is airspace opacity throughout both mid and lower lung regions and in the periphery of each upper lobe region. Heart size and pulmonary vascularity are normal. No adenopathy. Metallic foreign bodies noted on the right anteriorly are stable. No bone lesions. IMPRESSION:  Airspace opacity throughout both lungs, primarily in the mid and lower lung regions. Suspect multifocal pneumonia, likely of atypical organism etiology. Correlation with COVID-19 status advised. Heart size normal.  No adenopathy evident. Electronically Signed   By: Lowella Grip III M.D.   On: 06/24/2020 13:08    ____________________________________________   PROCEDURES and INTERVENTIONS  Procedure(s) performed (including Critical Care):  Procedures  Medications  remdesivir 200 mg in sodium chloride 0.9% 250 mL IVPB (has no administration in time range)    Followed by  remdesivir 100 mg in sodium chloride 0.9 % 100 mL IVPB (has no administration in time range)  dexamethasone (DECADRON) injection 6 mg (6 mg Intravenous Given 06/24/20 1514)    ____________________________________________   MDM / ED COURSE  Unvaccinated 74 year old male presents the ED with 1 week of worsening dyspnea with evidence of COVID-19 and hypoxia requiring medical admission.  Patient hypoxic to the mid 80s on room air requiring 3 L nasal cannula, hemodynamically stable and not tachycardic.  Exam demonstrates tachypnea and dyspnea necessitating speaking in phrases only, he is otherwise well head without distress.  Denies pain or trauma.  No evidence of neurovascular deficits or distress.  Blood work with CKD at baseline and hyperglycemia without acidosis.  CXR with expected infiltrates in the setting of COVID-19.  His COVID-19 PCR is positive.  Patient was started on Decadron and remdesivir to treat his hypoxia COVID-19.  Patient has no chest pain and his EKG shows no ischemia, troponin is negative.  Patient admitted to hospitalist service for further work-up and management.      ____________________________________________   FINAL CLINICAL IMPRESSION(S) / ED DIAGNOSES  Final diagnoses:  COVID-19  SOB (shortness of breath)  Hypoxia  Severe acute respiratory syndrome coronavirus 2 (SARS-CoV-2) vaccine not  administered     ED Discharge Orders    None       Dillinger Aston   Note:  This document was prepared using Systems analyst and may include unintentional dictation errors.   Vladimir Crofts, MD 06/24/20 1537

## 2020-06-24 NOTE — Consult Note (Signed)
Remdesivir - Pharmacy Brief Note    A/P:  Remdesivir 200 mg IVPB once followed by 100 mg IVPB daily x 4 days.   Kristeen Miss, PharmD Clinical Pharmacist   06/24/2020 3:02 PM

## 2020-06-24 NOTE — ED Notes (Signed)
MD Vladimir Crofts at bedside at this time

## 2020-06-24 NOTE — H&P (Signed)
[place] [facility] [place] NAME: Wojciech Willetts    MR#:  [id number]  DATE OF BIRTH:  06-Feb-1946  DATE OF ADMISSION:  06/24/2020  PRIMARY CARE PHYSICIAN: [name], MD   REQUESTING/REFERRING PHYSICIAN: Vladimir Crofts, MD  CHIEF COMPLAINT:   Chief Complaint  Patient presents with  . Shortness of Breath    HISTORY OF PRESENT ILLNESS:  Gilbert Obrien  is a 74 y.o. male with a known history of coronary artery disease, diabetes, depression, hypertension, hyperlipidemia, CKD stage III is being admitted for COVID-19 infection.  Patient is unvaccinated for COVID-19.  Patient reports being exposed to one of his elderly family member with Covid.  He has been feeling ill for about a month with generalized weakness and shortness of breath.  Over last week his shortness of breath kept getting worse.  Decrease activity level, poor appetite and started spiking fever/chills.  He has also been having cough but nonproductive.  While in the ED his COVID is positive and he is being admitted for further evaluation and management.  His oxygen saturations were in mid 80s on room air and was placed on 3 L oxygen via nasal cannula with saturations in 90s.  Patient was given dose of remdesivir and Decadron while in the ED.  PAST MEDICAL HISTORY:   Past Medical History:  Diagnosis Date  . Arthritis   . Chronic kidney disease    per patient has stage 3 kidney failure   . Coronary artery disease    a. STEMI 03/2016 DESx1 to LCx, DES x 1 Mid LAD, DES x1 prox RCA  . Depression   . Diabetes mellitus without complication ([place])    type 2  . Dyspnea   . History of acute inferior wall myocardial infarction 04/15/2016   inf-lat/post >> PCI with DES of LCx; staged PCI of LAD and RCA  . Hypercholesteremia   . Hypertension   . Ischemic cardiomyopathy 04/19/2016   A. Inf-lat/post STEMI 7/17 >> b. Echo 04/17/16: Septal and post lateral HK, poor image quality, EF 35-40% // b. Echo 11/17:  mild LVH, EF 50-55, inf-lat and ant-lat HK, Gr 1 DD, borderline dilated aortic root (37 mm), MAC  . Leg pain    ABIs 3/19:  normal   PAST SURGICAL HISTORY:   Past Surgical History:  Procedure Laterality Date  . CARDIAC CATHETERIZATION N/A 04/15/2016   Procedure: Left Heart Cath and Coronary Angiography;  Surgeon: Sherren Mocha, MD;  Location: [place] [facility];  Service: Cardiovascular;  Laterality: N/A;  . CARDIAC CATHETERIZATION N/A 04/15/2016   Procedure: Coronary Stent Intervention;  Surgeon: Sherren Mocha, MD;  Location: [place] [facility];  Service: Cardiovascular;  Laterality: N/A;  . CARDIAC CATHETERIZATION N/A 04/17/2016   Procedure: Coronary Stent Intervention;  Surgeon: Burnell Blanks, MD;  Location: [place] [facility];  Service: Cardiovascular;  Laterality: N/A;  . CARDIAC CATHETERIZATION N/A 04/18/2016   Procedure: Coronary Stent Intervention;  Surgeon: Burnell Blanks, MD;  Location: [place] [facility];  Service: Cardiovascular;  Laterality: N/A;  . CARDIAC CATHETERIZATION N/A 04/18/2016   Procedure: Temporary Pacemaker;  Surgeon: Burnell Blanks, MD;  Location: [place] [facility];  Service: Cardiovascular;  Laterality: N/A;  . CORONARY STENT PLACEMENT  04/17/2016    Severe stenosis proximal LAD, now s/p successful PTCA/DES x 1 proximal and mid LAD  . COSMETIC SURGERY  1974   right side facial       . SHOULDER SURGERY  SOCIAL HISTORY:   Social History   Tobacco Use  . Smoking status: Former Smoker    Quit date: 09/25/2000    Years since quitting: 19.7  . Smokeless tobacco: Never Used  . Tobacco comment: every few days last one 2 days ago  Substance Use Topics  . Alcohol use: No   FAMILY HISTORY:   Family History  Problem Relation Age of Onset  . Heart attack Brother   . Heart disease Brother    DRUG ALLERGIES:   Allergies  Allergen Reactions  . Celecoxib Anaphylaxis    Swelling in throat.  . Sulfa Antibiotics Nausea And Vomiting and  Swelling   REVIEW OF SYSTEMS:  Review of Systems  Constitutional: Positive for chills, fever and malaise/fatigue. Negative for diaphoresis and weight loss.  HENT: Negative for ear discharge, ear pain, hearing loss, nosebleeds, sore throat and tinnitus.   Eyes: Negative for blurred vision and pain.  Respiratory: Positive for cough and shortness of breath. Negative for hemoptysis and wheezing.   Cardiovascular: Negative for chest pain, palpitations, orthopnea and leg swelling.  Gastrointestinal: Negative for abdominal pain, blood in stool, constipation, diarrhea, heartburn, nausea and vomiting.  Genitourinary: Negative for dysuria, frequency and urgency.  Musculoskeletal: Negative for back pain and myalgias.  Skin: Negative for itching and rash.  Neurological: Negative for dizziness, tingling, tremors, focal weakness, seizures, weakness and headaches.  Psychiatric/Behavioral: Negative for depression. The patient is not nervous/anxious.    MEDICATIONS AT HOME:   Prior to Admission medications   Medication Sig Start Date End Date Taking? Authorizing Provider  acetaminophen (TYLENOL) 500 MG tablet Take 2 tablets (1,000 mg total) by mouth every 6 (six) hours as needed for mild pain. 05/25/20 05/25/21 Yes Piscoya, Jacqulyn Bath, MD  amLODipine (NORVASC) 10 MG tablet TAKE 1 TABLET EVERY DAY Patient taking differently: Take 10 mg by mouth daily.  04/02/20  Yes Sherren Mocha, MD  aspirin EC 81 MG tablet Take 1 tablet (81 mg total) by mouth daily. 12/19/19  Yes Sherren Mocha, MD  atorvastatin (LIPITOR) 80 MG tablet Take 1 tablet (80 mg total) by mouth daily at 6 PM. 04/19/16  Yes Arbutus Leas, NP  carvedilol (COREG) 25 MG tablet Take 1 tablet (25 mg total) by mouth in the morning and at bedtime. 05/13/20  Yes Sherren Mocha, MD  furosemide (LASIX) 20 MG tablet TAKE 1 TABLET EVERY DAY Patient taking differently: Take 20 mg by mouth at bedtime.  07/10/19  Yes Daune Perch, NP  glimepiride (AMARYL) 2 MG  tablet Take 4 mg by mouth daily with breakfast.  10/17/17  Yes [provider]  PROCTO-MED HC 2.5 % rectal cream Place 1 application rectally 3 (three) times daily as needed (discomfort/hemorrhoids.).  04/02/20  Yes [provider]  ramipril (ALTACE) 5 MG capsule TAKE 1 CAPSULE TWICE DAILY Patient taking differently: Take 5 mg by mouth in the morning and at bedtime.  09/22/19  Yes Sherren Mocha, MD  sertraline (ZOLOFT) 50 MG tablet Take 50 mg by mouth daily.   Yes [provider]  temazepam (RESTORIL) 15 MG capsule Take 15 mg by mouth at bedtime as needed for sleep.    Yes [provider]    VITAL SIGNS:  Blood pressure (!) 150/83, pulse 73, temperature 98.5 F (36.9 C), temperature source Oral, resp. rate (!) 28, height _0  (1.702 m), weight 81.6 kg, SpO2 97 %. PHYSICAL EXAMINATION:  Physical Exam  GENERAL:  74 y.o.-year-old patient lying in the bed with no acute distress.  EYES: Pupils equal, round, reactive to light and accommodation. No scleral icterus. Extraocular muscles intact.  HEENT: Head atraumatic, normocephalic. Oropharynx and nasopharynx clear.  NECK:  Supple, no jugular venous distention. No thyroid enlargement, no tenderness.  LUNGS: Normal breath sounds bilaterally, no wheezing, rales,rhonchi or crepitation. No use of accessory muscles of respiration.  CARDIOVASCULAR: S1, S2 normal. No murmurs, rubs, or gallops.  ABDOMEN: Soft, nontender, nondistended. Bowel sounds present. No organomegaly or mass.  EXTREMITIES: No pedal edema, cyanosis, or clubbing.  NEUROLOGIC: Cranial nerves II through XII are intact. Muscle strength 5/5 in all extremities. Sensation intact. Gait not checked.  PSYCHIATRIC: The patient is alert and oriented x 3.  SKIN: No obvious rash, lesion, or ulcer.  LABORATORY PANEL:   CBC Recent Labs  Lab 06/24/20 1227  WBC 7.2  HGB 12.3*  HCT 35.9*  PLT 316    ------------------------------------------------------------------------------------------------------------------  Chemistries  Recent Labs  Lab 06/24/20 1227  NA 139  K 3.9  CL 105  CO2 25  GLUCOSE 235*  BUN 16  CREATININE 1.41*  CALCIUM 8.6*   ------------------------------------------------------------------------------------------------------------------  Cardiac Enzymes No results for input(s): TROPONINI in the last 168 hours. ------------------------------------------------------------------------------------------------------------------  RADIOLOGY:  DG Chest 2 View  Result Date: 06/24/2020 CLINICAL DATA:  Shortness of breath EXAM: CHEST - 2 VIEW COMPARISON:  March 24, 2020 FINDINGS: There is airspace opacity throughout both mid and lower lung regions and in the periphery of each upper lobe region. Heart size and pulmonary vascularity are normal. No adenopathy. Metallic foreign bodies noted on the right anteriorly are stable. No bone lesions. IMPRESSION: Airspace opacity throughout both lungs, primarily in the mid and lower lung regions. Suspect multifocal pneumonia, likely of atypical organism etiology. Correlation with COVID-19 status advised. Heart size normal.  No adenopathy evident. Electronically Signed   By: Lowella Grip III M.D.   On: 06/24/2020 13:08   IMPRESSION AND PLAN:  74 year old male with a known history of coronary artery disease, diabetes, depression, hypertension, hyperlipidemia, CKD stage III is being admitted for COVID-19 infection.  COVID-19 pneumonia -Remdesivir and Decadron per pharmacy.  Given 1 dose of both while in the ED - Vitamins, antitussives -Trend/ daily inflammatory markers -Wean oxygen as able, currently on 3 L oxygen -Prone while awake per protocol -Unvaccinated and not interested in getting vaccine  Essential hypertension Continue Coreg, ramipril, Norvasc  Depression Continue Zoloft, Restoril as needed  Diabetes  mellitus Sliding scale insulin, Levemir 8 units subcu twice daily, Tradjenta 5 mg p.o. daily Diabetic nurse consult    All the records are reviewed and case discussed with ED provider. Management plans discussed with the patient, nursing and they are in agreement.  CODE STATUS: Full code  TOTAL TIME TAKING CARE OF THIS PATIENT: 45 minutes.    Max Sane M.D on 06/24/2020 at 8:12 PM  [facility]   CC: Primary care physician; [name], MD   Note: This dictation was prepared with Dragon dictation along with smaller phrase technology. Any transcriptional errors that result from this process are unintentional.

## 2020-06-24 NOTE — ED Notes (Addendum)
This RN to bedside at this time. Introduced self to pt at this time. 20g PIV placed in L AC by this RN at this time, pt tolerated well. Blood return noted, and PIV flushed at this time.   Meds given per order at this time.   Pt denies any further needs at this time

## 2020-06-24 NOTE — ED Notes (Signed)
Pt given pillow and denies further needs at this time.

## 2020-06-24 NOTE — ED Triage Notes (Signed)
Pt states he had gall bladder removed 8/31 and started having cough, sob 9/8 when he was suppose to have his follow up after surgery. States he has not been able to recover and has been having increased SOB, states he never went and got tested for covid. Pt is tachypnic resting.

## 2020-06-24 NOTE — ED Notes (Signed)
Message sent to pharmacy regarding medications due to patient being admission hold in ED.

## 2020-06-25 ENCOUNTER — Encounter: Payer: Self-pay | Admitting: Internal Medicine

## 2020-06-25 DIAGNOSIS — E1122 Type 2 diabetes mellitus with diabetic chronic kidney disease: Secondary | ICD-10-CM

## 2020-06-25 DIAGNOSIS — N1831 Chronic kidney disease, stage 3a: Secondary | ICD-10-CM

## 2020-06-25 DIAGNOSIS — I5032 Chronic diastolic (congestive) heart failure: Secondary | ICD-10-CM

## 2020-06-25 LAB — CBC WITH DIFFERENTIAL/PLATELET
Abs Immature Granulocytes: 0.11 10*3/uL — ABNORMAL HIGH (ref 0.00–0.07)
Basophils Absolute: 0 10*3/uL (ref 0.0–0.1)
Basophils Relative: 0 %
Eosinophils Absolute: 0 10*3/uL (ref 0.0–0.5)
Eosinophils Relative: 0 %
HCT: 32.7 % — ABNORMAL LOW (ref 39.0–52.0)
Hemoglobin: 10.7 g/dL — ABNORMAL LOW (ref 13.0–17.0)
Immature Granulocytes: 1 %
Lymphocytes Relative: 8 %
Lymphs Abs: 0.6 10*3/uL — ABNORMAL LOW (ref 0.7–4.0)
MCH: 27.9 pg (ref 26.0–34.0)
MCHC: 32.7 g/dL (ref 30.0–36.0)
MCV: 85.4 fL (ref 80.0–100.0)
Monocytes Absolute: 0.4 10*3/uL (ref 0.1–1.0)
Monocytes Relative: 4 %
Neutro Abs: 7.4 10*3/uL (ref 1.7–7.7)
Neutrophils Relative %: 87 %
Platelets: 291 10*3/uL (ref 150–400)
RBC: 3.83 MIL/uL — ABNORMAL LOW (ref 4.22–5.81)
RDW: 13.6 % (ref 11.5–15.5)
WBC: 8.5 10*3/uL (ref 4.0–10.5)
nRBC: 0 % (ref 0.0–0.2)

## 2020-06-25 LAB — COMPREHENSIVE METABOLIC PANEL
ALT: 30 U/L (ref 0–44)
AST: 19 U/L (ref 15–41)
Albumin: 2.6 g/dL — ABNORMAL LOW (ref 3.5–5.0)
Alkaline Phosphatase: 62 U/L (ref 38–126)
Anion gap: 8 (ref 5–15)
BUN: 18 mg/dL (ref 8–23)
CO2: 22 mmol/L (ref 22–32)
Calcium: 8.1 mg/dL — ABNORMAL LOW (ref 8.9–10.3)
Chloride: 107 mmol/L (ref 98–111)
Creatinine, Ser: 1.36 mg/dL — ABNORMAL HIGH (ref 0.61–1.24)
GFR calc Af Amer: 59 mL/min — ABNORMAL LOW (ref >60)
GFR calc non Af Amer: 51 mL/min — ABNORMAL LOW (ref >60)
Glucose, Bld: 234 mg/dL — ABNORMAL HIGH (ref 70–99)
Potassium: 3.7 mmol/L (ref 3.5–5.1)
Sodium: 137 mmol/L (ref 135–145)
Total Bilirubin: 0.6 mg/dL (ref 0.3–1.2)
Total Protein: 6.9 g/dL (ref 6.5–8.1)

## 2020-06-25 LAB — GLUCOSE, CAPILLARY
Glucose-Capillary: 181 mg/dL — ABNORMAL HIGH (ref 70–99)
Glucose-Capillary: 199 mg/dL — ABNORMAL HIGH (ref 70–99)
Glucose-Capillary: 231 mg/dL — ABNORMAL HIGH (ref 70–99)
Glucose-Capillary: 247 mg/dL — ABNORMAL HIGH (ref 70–99)
Glucose-Capillary: 262 mg/dL — ABNORMAL HIGH (ref 70–99)

## 2020-06-25 LAB — FERRITIN: Ferritin: 387 ng/mL — ABNORMAL HIGH (ref 24–336)

## 2020-06-25 LAB — FIBRIN DERIVATIVES D-DIMER (ARMC ONLY): Fibrin derivatives D-dimer (ARMC): 1627.43 ng/mL (FEU) — ABNORMAL HIGH (ref 0.00–499.00)

## 2020-06-25 LAB — EXPECTORATED SPUTUM ASSESSMENT W GRAM STAIN, RFLX TO RESP C

## 2020-06-25 LAB — C-REACTIVE PROTEIN: CRP: 0.8 mg/dL (ref <1.0)

## 2020-06-25 LAB — MAGNESIUM: Magnesium: 2.3 mg/dL (ref 1.7–2.4)

## 2020-06-25 LAB — PHOSPHORUS: Phosphorus: 2.3 mg/dL — ABNORMAL LOW (ref 2.5–4.6)

## 2020-06-25 MED ORDER — NAPHAZOLINE-PHENIRAMINE 0.025-0.3 % OP SOLN
1.0000 [drp] | Freq: Four times a day (QID) | OPHTHALMIC | Status: DC | PRN
  Administered 2020-06-25: 1 [drp] via OPHTHALMIC
  Filled 2020-06-25: qty 5

## 2020-06-25 NOTE — ED Notes (Signed)
Sam RN called and spoke with Ginger, 1C secretary, and informed of pt arrival at this time.

## 2020-06-25 NOTE — ED Notes (Signed)
This RN to bedside at this time. Introduced self to pt at this time. Pt visualized NAD.  Pt denies any needs at this time.

## 2020-06-25 NOTE — Plan of Care (Signed)
  Problem: Education: Goal: Knowledge of risk factors and measures for prevention of condition will improve Outcome: Progressing   Problem: Coping: Goal: Psychosocial and spiritual needs will be supported Outcome: Progressing   Problem: Respiratory: Goal: Will maintain a patent airway Outcome: Progressing Goal: Complications related to the disease process, condition or treatment will be avoided or minimized Outcome: Progressing   

## 2020-06-25 NOTE — ED Notes (Signed)
Attempted to call report to [facility] in 1C. Per Network engineer, RN unavailable. Ascom number provided for callback. Will inform charge [place].

## 2020-06-25 NOTE — Progress Notes (Signed)
Patient is resting in bed. Is alert and oriented x4. He has no breaks or bruises on skin. Has oxygen at 3L [place]. Bed in low position and call bell in reach. NS at 11mls/hr. Patient did voice concern about eyes being dry, patient requested a wet rag, however did notify DR and he will order something (relayed via secure chat). He did also complain about not having BM in a few days. Gave PRN dulcolax, instructed patient not to get up without assistance.

## 2020-06-25 NOTE — Progress Notes (Signed)
PROGRESS NOTE    Gilbert Obrien  HKV:[id number] DOB: 1946/05/23 DOA: 06/24/2020 PCP: [name], MD   Brief Narrative: Gilbert Obrien is a 74 y.o. male with a known history of coronary artery disease, stroke, diabetes, depression, hypertension, hyperlipidemia, CKD stage III. Patient presented secondary to dyspnea in setting of COVID-19 pneumonia.   Assessment & Plan:   Active Problems:   Type II diabetes mellitus with renal manifestations (HCC)   CKD (chronic kidney disease), stage IIIa   HLD (hyperlipidemia)   Essential hypertension   Chronic diastolic CHF (congestive heart failure) ([place])   COVID-19 virus infection   COVID-19 pneumonia Patient is unvaccinated. Started on Remdesivir and Decadron -Continue Remdesivir and Decadron -Daily CBC, CMP, CRP, Ferritin, D-dimer  Acute respiratory failure with hypoxia -Continue oxygen, wean to room air as able; keep O2 saturations >92%  Essential hypertension -Continue Coreg, ramipril, amlodipine  Depression -Continue Zoloft, Restoril  Diabetes mellitus, type 2 -Continue Levemir and SSI -Continue Tradjenta  Hyperlipidemia -Continue Lipitor  CKD stage IIIa Stable.  Chronic diastolic heart failure -Continue Lasix   DVT prophylaxis: Lovenox Code Status:   Code Status: Full Code Family Communication: None at bedside Disposition Plan: Discharge likely home 1-3 days   Consultants:   None  Procedures:   None  Antimicrobials:  Remdesivir IV    Subjective: Some dyspnea especially with exertion  Objective: Vitals:   06/25/20 0739 06/25/20 0845 06/25/20 1152 06/25/20 1605  BP:   (!) 144/75 133/80  Pulse:  65 74 75  Resp:  (!) 23 18 18   Temp:   97.9 F (36.6 C) 98 F (36.7 C)  TempSrc:   Oral Oral  SpO2: 98% 98% 96% 95%  Weight:      Height:        Intake/Output Summary (Last 24 hours) at 06/25/2020 1626 Last data filed at 06/25/2020 0421 Gross per 24 hour  Intake 250 ml  Output 460 ml    Net -210 ml   Filed Weights   06/24/20 1220  Weight: 81.6 kg    Examination:  General exam: Appears calm and comfortable Respiratory system: Clear to auscultation. Respiratory effort normal. Cardiovascular system: S1 & S2 heard, RRR. No murmurs, rubs, gallops or clicks. Gastrointestinal system: Abdomen is nondistended, soft and nontender. No organomegaly or masses felt. Normal bowel sounds heard. Central nervous system: Alert and oriented. No focal neurological deficits. Musculoskeletal: No edema. No calf tenderness Skin: No cyanosis. No rashes Psychiatry: Judgement and insight appear normal. Mood & affect appropriate.     Data Reviewed: I have personally reviewed following labs and imaging studies  CBC Lab Results  Component Value Date   WBC 8.5 06/25/2020   RBC 3.83 (L) 06/25/2020   HGB 10.7 (L) 06/25/2020   HCT 32.7 (L) 06/25/2020   MCV 85.4 06/25/2020   MCH 27.9 06/25/2020   PLT 291 06/25/2020   MCHC 32.7 06/25/2020   RDW 13.6 06/25/2020   LYMPHSABS 0.6 (L) 06/25/2020   MONOABS 0.4 06/25/2020   EOSABS 0.0 06/25/2020   BASOSABS 0.0 56/43/3295     Last metabolic panel Lab Results  Component Value Date   NA 137 06/25/2020   K 3.7 06/25/2020   CL 107 06/25/2020   CO2 22 06/25/2020   BUN 18 06/25/2020   CREATININE 1.36 (H) 06/25/2020   GLUCOSE 234 (H) 06/25/2020   GFRNONAA 51 (L) 06/25/2020   GFRAA 59 (L) 06/25/2020   CALCIUM 8.1 (L) 06/25/2020   PHOS 2.3 (L) 06/25/2020  PROT 6.9 06/25/2020   ALBUMIN 2.6 (L) 06/25/2020   BILITOT 0.6 06/25/2020   ALKPHOS 62 06/25/2020   AST 19 06/25/2020   ALT 30 06/25/2020   ANIONGAP 8 06/25/2020    CBG (last 3)  Recent Labs    06/25/20 0407 06/25/20 0851 06/25/20 1152  GLUCAP 231* 181* 247*     GFR: Estimated Creatinine Clearance: 48 mL/min (A) (by C-G formula based on SCr of 1.36 mg/dL (H)).  Coagulation Profile: No results for input(s): INR, PROTIME in the last 168 hours.  Recent Results (from the  past 240 hour(s))  Respiratory Panel by RT PCR (Flu A&B, Covid) - Nasopharyngeal Swab     Status: Abnormal   Collection Time: 06/24/20 12:43 PM   Specimen: Nasopharyngeal Swab  Result Value Ref Range Status   SARS Coronavirus 2 by RT PCR POSITIVE (A) NEGATIVE Final    Comment: RESULT CALLED TO, READ BACK BY AND VERIFIED WITH: STEPHANIE RUDD AT 1335 ON 06/24/2020 [place]. (NOTE) SARS-CoV-2 target nucleic acids are DETECTED.  SARS-CoV-2 RNA is generally detectable in upper respiratory specimens  during the acute phase of infection. Positive results are indicative of the presence of the identified virus, but do not rule out bacterial infection or co-infection with other pathogens not detected by the test. Clinical correlation with patient history and other diagnostic information is necessary to determine patient infection status. The expected result is Negative.  Fact Sheet for Patients:  PinkCheek.be  Fact Sheet for Healthcare Providers: GravelBags.it  This test is not yet approved or cleared by the [place] FDA and  has been authorized for detection and/or diagnosis of SARS-CoV-2 by FDA under an Emergency Use Authorization (EUA).  This EUA will remain in effect (meaning this test c an be used) for the duration of  the COVID-19 declaration under Section 564(b)(1) of the Act, 21 U.S.C. section 360bbb-3(b)(1), unless the authorization is terminated or revoked sooner.      Influenza A by PCR NEGATIVE NEGATIVE Final   Influenza B by PCR NEGATIVE NEGATIVE Final    Comment: (NOTE) The Xpert Xpress SARS-CoV-2/FLU/RSV assay is intended as an aid in  the diagnosis of influenza from Nasopharyngeal swab specimens and  should not be used as a sole basis for treatment. Nasal washings and  aspirates are unacceptable for Xpert Xpress SARS-CoV-2/FLU/RSV  testing.  Fact Sheet for  Patients: PinkCheek.be  Fact Sheet for Healthcare Providers: GravelBags.it  This test is not yet approved or cleared by the [place] FDA and  has been authorized for detection and/or diagnosis of SARS-CoV-2 by  FDA under an Emergency Use Authorization (EUA). This EUA will remain  in effect (meaning this test can be used) for the duration of the  Covid-19 declaration under Section 564(b)(1) of the Act, 21  U.S.C. section 360bbb-3(b)(1), unless the authorization is  terminated or revoked. Performed at [facility], [address] [place], [place] [address]   Culture, sputum-assessment     Status: None   Collection Time: 06/25/20  3:58 AM   Specimen: Sputum  Result Value Ref Range Status   Specimen Description SPUTUM  Final   Special Requests NONE  Final   Sputum evaluation   Final    Sputum specimen not acceptable for testing.  Please recollect.   Performed at [facility], [address] [place], [place] [address]    Report Status 06/25/2020 FINAL  Final        Radiology Studies: DG Chest 2 View  Result Date:  06/24/2020 CLINICAL DATA:  Shortness of breath EXAM: CHEST - 2 VIEW COMPARISON:  March 24, 2020 FINDINGS: There is airspace opacity throughout both mid and lower lung regions and in the periphery of each upper lobe region. Heart size and pulmonary vascularity are normal. No adenopathy. Metallic foreign bodies noted on the right anteriorly are stable. No bone lesions. IMPRESSION: Airspace opacity throughout both lungs, primarily in the mid and lower lung regions. Suspect multifocal pneumonia, likely of atypical organism etiology. Correlation with COVID-19 status advised. Heart size normal.  No adenopathy evident. Electronically Signed   By: Lowella Grip III M.D.   On: 06/24/2020 13:08        Scheduled Meds: . amLODipine  10 mg Oral Daily  . vitamin C  500 mg Oral Daily  .  atorvastatin  80 mg Oral q1800  . carvedilol  25 mg Oral BID WC  . dexamethasone (DECADRON) injection  6 mg Intravenous Q24H  . docusate sodium  100 mg Oral BID  . enoxaparin (LOVENOX) injection  40 mg Subcutaneous Q24H  . furosemide  20 mg Oral Daily  . insulin aspart  0-15 Units Subcutaneous Q4H  . insulin detemir  0.1 Units/kg Subcutaneous BID  . Ipratropium-Albuterol  1 puff Inhalation Q6H  . linagliptin  5 mg Oral Daily  . ramipril  5 mg Oral BID  . sertraline  50 mg Oral Daily  . zinc sulfate  220 mg Oral Daily   Continuous Infusions: . sodium chloride 75 mL (06/25/20 1522)  . remdesivir 100 mg in NS 100 mL 100 mg (06/25/20 1035)     LOS: 1 day     Cordelia Poche, MD [facility] 06/25/2020, 4:26 PM  If 7PM-7AM, please contact night-coverage [email or web address]

## 2020-06-26 LAB — CBC WITH DIFFERENTIAL/PLATELET
Abs Immature Granulocytes: 0.14 10*3/uL — ABNORMAL HIGH (ref 0.00–0.07)
Basophils Absolute: 0 10*3/uL (ref 0.0–0.1)
Basophils Relative: 0 %
Eosinophils Absolute: 0 10*3/uL (ref 0.0–0.5)
Eosinophils Relative: 0 %
HCT: 30.3 % — ABNORMAL LOW (ref 39.0–52.0)
Hemoglobin: 10.3 g/dL — ABNORMAL LOW (ref 13.0–17.0)
Immature Granulocytes: 1 %
Lymphocytes Relative: 9 %
Lymphs Abs: 1.1 10*3/uL (ref 0.7–4.0)
MCH: 28.8 pg (ref 26.0–34.0)
MCHC: 34 g/dL (ref 30.0–36.0)
MCV: 84.6 fL (ref 80.0–100.0)
Monocytes Absolute: 1.1 10*3/uL — ABNORMAL HIGH (ref 0.1–1.0)
Monocytes Relative: 9 %
Neutro Abs: 10.3 10*3/uL — ABNORMAL HIGH (ref 1.7–7.7)
Neutrophils Relative %: 81 %
Platelets: 290 10*3/uL (ref 150–400)
RBC: 3.58 MIL/uL — ABNORMAL LOW (ref 4.22–5.81)
RDW: 14.2 % (ref 11.5–15.5)
WBC: 12.7 10*3/uL — ABNORMAL HIGH (ref 4.0–10.5)
nRBC: 0 % (ref 0.0–0.2)

## 2020-06-26 LAB — COMPREHENSIVE METABOLIC PANEL
ALT: 23 U/L (ref 0–44)
AST: 14 U/L — ABNORMAL LOW (ref 15–41)
Albumin: 2.5 g/dL — ABNORMAL LOW (ref 3.5–5.0)
Alkaline Phosphatase: 60 U/L (ref 38–126)
Anion gap: 8 (ref 5–15)
BUN: 20 mg/dL (ref 8–23)
CO2: 20 mmol/L — ABNORMAL LOW (ref 22–32)
Calcium: 8.3 mg/dL — ABNORMAL LOW (ref 8.9–10.3)
Chloride: 108 mmol/L (ref 98–111)
Creatinine, Ser: 1.23 mg/dL (ref 0.61–1.24)
GFR calc Af Amer: >60 mL/min (ref >60)
GFR calc non Af Amer: 57 mL/min — ABNORMAL LOW (ref >60)
Glucose, Bld: 141 mg/dL — ABNORMAL HIGH (ref 70–99)
Potassium: 3.7 mmol/L (ref 3.5–5.1)
Sodium: 136 mmol/L (ref 135–145)
Total Bilirubin: 0.5 mg/dL (ref 0.3–1.2)
Total Protein: 6.1 g/dL — ABNORMAL LOW (ref 6.5–8.1)

## 2020-06-26 LAB — GLUCOSE, CAPILLARY
Glucose-Capillary: 132 mg/dL — ABNORMAL HIGH (ref 70–99)
Glucose-Capillary: 144 mg/dL — ABNORMAL HIGH (ref 70–99)
Glucose-Capillary: 229 mg/dL — ABNORMAL HIGH (ref 70–99)
Glucose-Capillary: 247 mg/dL — ABNORMAL HIGH (ref 70–99)
Glucose-Capillary: 265 mg/dL — ABNORMAL HIGH (ref 70–99)
Glucose-Capillary: 275 mg/dL — ABNORMAL HIGH (ref 70–99)
Glucose-Capillary: 277 mg/dL — ABNORMAL HIGH (ref 70–99)

## 2020-06-26 LAB — FIBRIN DERIVATIVES D-DIMER (ARMC ONLY): Fibrin derivatives D-dimer (ARMC): 1069.9 ng/mL (FEU) — ABNORMAL HIGH (ref 0.00–499.00)

## 2020-06-26 LAB — FERRITIN: Ferritin: 357 ng/mL — ABNORMAL HIGH (ref 24–336)

## 2020-06-26 LAB — C-REACTIVE PROTEIN: CRP: <0.5 mg/dL (ref <1.0)

## 2020-06-26 LAB — PHOSPHORUS: Phosphorus: 3 mg/dL (ref 2.5–4.6)

## 2020-06-26 LAB — MAGNESIUM: Magnesium: 2.4 mg/dL (ref 1.7–2.4)

## 2020-06-26 NOTE — Plan of Care (Signed)
  Problem: Education: Goal: Knowledge of risk factors and measures for prevention of condition will improve Outcome: Progressing   Problem: Coping: Goal: Psychosocial and spiritual needs will be supported Outcome: Progressing   Problem: Respiratory: Goal: Will maintain a patent airway Outcome: Progressing Goal: Complications related to the disease process, condition or treatment will be avoided or minimized Outcome: Progressing   

## 2020-06-26 NOTE — Progress Notes (Signed)
Patient resting in bed. Has no complaints. Oxygen levels have been maintaining in the 90s, will begin to wean patient off oxygen. Bed in low position and call bell in reach.

## 2020-06-26 NOTE — Evaluation (Signed)
Physical Therapy Evaluation Patient Details Name: Gilbert Obrien MRN: [id number] DOB: Mar 04, 1946 Today's Date: 06/26/2020   History of Present Illness  Gilbert Obrien is a 74 y.o. male with a known history of coronary artery disease, stroke, diabetes, depression, hypertension, hyperlipidemia, CKD stage III. Patient presented secondary to dyspnea in setting of COVID-19 pneumonia.     Clinical Impression  Pt received in supine position upon arrival to room.  Pt motivated for therapy and to be able to walk.  Pt noted he had used the bedside commode prior to therapist arrival.  Pt performed bed-level exercises without difficulty.  Pt then performed transfer to sitting EOB with increased time necessary.  Pt checked for dizziness before coming to standing position.  Pt able to stand without any AD.  Pt then ambulated twice around the nursing station, noting some SOB after conclusion of gait training.  Pt utilized 3L of O2 throughout session and SpO2 never dropping below 90%.  Pt transferred back to bed where he was given call bell and all other necessities.  Pt denies needing any assistance at home and does not need PT services at d/c.  Pt will benefit from PT services during hospital to safely address deficits listed in patient problem list for decreased caregiver assistance and eventual return to PLOF.     Follow Up Recommendations No PT follow up    Equipment Recommendations  None recommended by PT    Recommendations for Other Services       Precautions / Restrictions Precautions Precautions: None Restrictions Weight Bearing Restrictions: No      Mobility  Bed Mobility Overal bed mobility: Modified Independent                Transfers Overall transfer level: Modified independent               General transfer comment: Pt mod I with transfer, only needing min extra time  Ambulation/Gait Ambulation/Gait assistance: Modified independent (Device/Increase time) Gait  Distance (Feet): 320 Feet   Gait Pattern/deviations: Step-through pattern;Decreased stride length Gait velocity: decreased.   General Gait Details: Pt ambulates well, but self-reports fatigue after second lap around nursing station.  Stairs            Wheelchair Mobility    Modified Rankin (Stroke Patients Only)       Balance Overall balance assessment: No apparent balance deficits (not formally assessed)                                           Pertinent Vitals/Pain Pain Assessment: No/denies pain    Home Living Family/patient expects to be discharged to:: Private residence Living Arrangements: Alone Available Help at Discharge: Family;Friend(s);Available PRN/intermittently Type of Home: House Home Access: Level entry     Home Layout: One level Home Equipment: None      Prior Function Level of Independence: Independent               Hand Dominance   Dominant Hand: Right    Extremity/Trunk Assessment   Upper Extremity Assessment Upper Extremity Assessment: Overall WFL for tasks assessed    Lower Extremity Assessment Lower Extremity Assessment: Overall WFL for tasks assessed       Communication   Communication: No difficulties  Cognition Arousal/Alertness: Awake/alert Behavior During Therapy: WFL for tasks assessed/performed Overall Cognitive Status: Within Functional Limits for tasks assessed  General Comments      Exercises Total Joint Exercises Ankle Circles/Pumps: AROM;Strengthening;Both;10 reps;Supine Quad Sets: AROM;Strengthening;Both;10 reps;Supine Gluteal Sets: AROM;Strengthening;Both;10 reps;Supine Hip ABduction/ADduction: AROM;Strengthening;Both;10 reps;Supine Straight Leg Raises: AROM;Strengthening;Both;10 reps;Supine Long Arc Quad: AROM;Strengthening;Both;10 reps;Supine Marching in Standing: AROM;Strengthening;Both;10 reps;Standing Other  Exercises Other Exercises: Pt educated on roles of PT and services provided during stay in hospital. Other Exercises: Pt received gait training around the nursing station with verbal cuing to lift LLE.  Pt noted he has difficulty with liftin LEs due to knee pain.   Assessment/Plan    PT Assessment Patient needs continued PT services  PT Problem List Decreased strength;Decreased activity tolerance;Decreased mobility       PT Treatment Interventions Gait training;Therapeutic exercise    PT Goals (Current goals can be found in the Care Plan section)       Frequency Min 2X/week   Barriers to discharge        Co-evaluation               AM-PAC PT "6 Clicks" Mobility  Outcome Measure Help needed turning from your back to your side while in a flat bed without using bedrails?: None Help needed moving from lying on your back to sitting on the side of a flat bed without using bedrails?: None Help needed moving to and from a bed to a chair (including a wheelchair)?: None Help needed standing up from a chair using your arms (e.g., wheelchair or bedside chair)?: A Little Help needed to walk in hospital room?: A Little Help needed climbing 3-5 steps with a railing? : A Little 6 Click Score: 21    End of Session Equipment Utilized During Treatment: Oxygen Activity Tolerance: Patient tolerated treatment well Patient left: in bed;with call bell/phone within reach Nurse Communication: Mobility status PT Visit Diagnosis: Muscle weakness (generalized) (M62.81)    Time: 7858-8502 PT Time Calculation (min) (ACUTE ONLY): 26 min   Charges:   PT Evaluation $PT Eval Low Complexity: 1 Low PT Treatments $Gait Training: 8-22 mins $Therapeutic Exercise: 8-22 mins        Gwenlyn Saran, PT, DPT 06/26/20, 12:50 PM

## 2020-06-26 NOTE — Progress Notes (Signed)
PROGRESS NOTE    Gilbert Obrien  PJA:[id number] DOB: 1946/04/09 DOA: 06/24/2020 PCP: [name], MD   Brief Narrative: Gilbert Obrien is a 74 y.o. male with a known history of coronary artery disease, stroke, diabetes, depression, hypertension, hyperlipidemia, CKD stage III. Patient presented secondary to dyspnea in setting of COVID-19 pneumonia.   Assessment & Plan:   Active Problems:   Type II diabetes mellitus with renal manifestations (HCC)   CKD (chronic kidney disease), stage IIIa   HLD (hyperlipidemia)   Essential hypertension   Chronic diastolic CHF (congestive heart failure) ([place])   COVID-19 virus infection   COVID-19 pneumonia Patient is unvaccinated. Started on Remdesivir and Decadron. Ferritin, D-dimer trending down. CRP pending today -Continue Remdesivir and Decadron -Daily CBC, CMP, CRP, Ferritin, D-dimer  Acute respiratory failure with hypoxia Stable on 3L via [place] -Continue oxygen, wean to room air as able; keep O2 saturations >92%  Essential hypertension -Continue Coreg, ramipril, amlodipine  Depression -Continue Zoloft, Restoril  Diabetes mellitus, type 2 -Continue Levemir and SSI -Continue Tradjenta  Hyperlipidemia -Continue Lipitor  CKD stage IIIa Stable.  Chronic diastolic heart failure -Continue Lasix   DVT prophylaxis: Lovenox Code Status:   Code Status: Full Code Family Communication: None at bedside Disposition Plan: Discharge likely home 2 days pending completion of Remdesivir   Consultants:   None  Procedures:   None  Antimicrobials:  Remdesivir IV    Subjective: Some dyspnea with exertion but feeling better.  Objective: Vitals:   06/26/20 0023 06/26/20 0541 06/26/20 0715 06/26/20 1132  BP: 136/66 138/66 (!) 141/74 130/74  Pulse: 73 65 66 75  Resp: 18 18 20 19   Temp: 98.1 F (36.7 C) 97.7 F (36.5 C) 97.7 F (36.5 C) 98.2 F (36.8 C)  TempSrc: Oral Oral Oral Oral  SpO2: 94% 94% 95% 94%  Weight:       Height:        Intake/Output Summary (Last 24 hours) at 06/26/2020 1301 Last data filed at 06/26/2020 1100 Gross per 24 hour  Intake 1200 ml  Output 1200 ml  Net 0 ml   Filed Weights   06/24/20 1220  Weight: 81.6 kg    Examination:  General exam: Appears calm and comfortable Respiratory system: Clear to auscultation. Respiratory effort normal. Cardiovascular system: S1 & S2 heard, RRR. No murmurs, rubs, gallops or clicks. Gastrointestinal system: Abdomen is nondistended, soft and nontender. No organomegaly or masses felt. Normal bowel sounds heard. Central nervous system: Alert and oriented. No focal neurological deficits. Musculoskeletal: No edema. No calf tenderness Skin: No cyanosis. No rashes Psychiatry: Judgement and insight appear normal. Mood & affect appropriate.     Data Reviewed: I have personally reviewed following labs and imaging studies  CBC Lab Results  Component Value Date   WBC 12.7 (H) 06/26/2020   RBC 3.58 (L) 06/26/2020   HGB 10.3 (L) 06/26/2020   HCT 30.3 (L) 06/26/2020   MCV 84.6 06/26/2020   MCH 28.8 06/26/2020   PLT 290 06/26/2020   MCHC 34.0 06/26/2020   RDW 14.2 06/26/2020   LYMPHSABS 1.1 06/26/2020   MONOABS 1.1 (H) 06/26/2020   EOSABS 0.0 06/26/2020   BASOSABS 0.0 34/19/3790     Last metabolic panel Lab Results  Component Value Date   NA 136 06/26/2020   K 3.7 06/26/2020   CL 108 06/26/2020   CO2 20 (L) 06/26/2020   BUN 20 06/26/2020   CREATININE 1.23 06/26/2020   GLUCOSE 141 (H) 06/26/2020   GFRNONAA  57 (L) 06/26/2020   GFRAA >60 06/26/2020   CALCIUM 8.3 (L) 06/26/2020   PHOS 3.0 06/26/2020   PROT 6.1 (L) 06/26/2020   ALBUMIN 2.5 (L) 06/26/2020   BILITOT 0.5 06/26/2020   ALKPHOS 60 06/26/2020   AST 14 (L) 06/26/2020   ALT 23 06/26/2020   ANIONGAP 8 06/26/2020    CBG (last 3)  Recent Labs    06/26/20 0541 06/26/20 0710 06/26/20 1127  GLUCAP 144* 132* 247*     GFR: Estimated Creatinine Clearance: 53.1  mL/min (by C-G formula based on SCr of 1.23 mg/dL).  Coagulation Profile: No results for input(s): INR, PROTIME in the last 168 hours.  Recent Results (from the past 240 hour(s))  Respiratory Panel by RT PCR (Flu A&B, Covid) - Nasopharyngeal Swab     Status: Abnormal   Collection Time: 06/24/20 12:43 PM   Specimen: Nasopharyngeal Swab  Result Value Ref Range Status   SARS Coronavirus 2 by RT PCR POSITIVE (A) NEGATIVE Final    Comment: RESULT CALLED TO, READ BACK BY AND VERIFIED WITH: STEPHANIE RUDD AT 1335 ON 06/24/2020 [place]. (NOTE) SARS-CoV-2 target nucleic acids are DETECTED.  SARS-CoV-2 RNA is generally detectable in upper respiratory specimens  during the acute phase of infection. Positive results are indicative of the presence of the identified virus, but do not rule out bacterial infection or co-infection with other pathogens not detected by the test. Clinical correlation with patient history and other diagnostic information is necessary to determine patient infection status. The expected result is Negative.  Fact Sheet for Patients:  PinkCheek.be  Fact Sheet for Healthcare Providers: GravelBags.it  This test is not yet approved or cleared by the [place] FDA and  has been authorized for detection and/or diagnosis of SARS-CoV-2 by FDA under an Emergency Use Authorization (EUA).  This EUA will remain in effect (meaning this test c an be used) for the duration of  the COVID-19 declaration under Section 564(b)(1) of the Act, 21 U.S.C. section 360bbb-3(b)(1), unless the authorization is terminated or revoked sooner.      Influenza A by PCR NEGATIVE NEGATIVE Final   Influenza B by PCR NEGATIVE NEGATIVE Final    Comment: (NOTE) The Xpert Xpress SARS-CoV-2/FLU/RSV assay is intended as an aid in  the diagnosis of influenza from Nasopharyngeal swab specimens and  should not be used as a sole basis for treatment.  Nasal washings and  aspirates are unacceptable for Xpert Xpress SARS-CoV-2/FLU/RSV  testing.  Fact Sheet for Patients: PinkCheek.be  Fact Sheet for Healthcare Providers: GravelBags.it  This test is not yet approved or cleared by the [place] FDA and  has been authorized for detection and/or diagnosis of SARS-CoV-2 by  FDA under an Emergency Use Authorization (EUA). This EUA will remain  in effect (meaning this test can be used) for the duration of the  Covid-19 declaration under Section 564(b)(1) of the Act, 21  U.S.C. section 360bbb-3(b)(1), unless the authorization is  terminated or revoked. Performed at [facility], [address] [place], [place] [address]   Culture, sputum-assessment     Status: None   Collection Time: 06/25/20  3:58 AM   Specimen: Sputum  Result Value Ref Range Status   Specimen Description SPUTUM  Final   Special Requests NONE  Final   Sputum evaluation   Final    Sputum specimen not acceptable for testing.  Please recollect.   Performed at [facility], [address] [place], [place] [address]    Report  Status 06/25/2020 FINAL  Final        Radiology Studies: No results found.      Scheduled Meds: . amLODipine  10 mg Oral Daily  . vitamin C  500 mg Oral Daily  . atorvastatin  80 mg Oral q1800  . carvedilol  25 mg Oral BID WC  . dexamethasone (DECADRON) injection  6 mg Intravenous Q24H  . docusate sodium  100 mg Oral BID  . enoxaparin (LOVENOX) injection  40 mg Subcutaneous Q24H  . furosemide  20 mg Oral Daily  . insulin aspart  0-15 Units Subcutaneous Q4H  . insulin detemir  0.1 Units/kg Subcutaneous BID  . Ipratropium-Albuterol  1 puff Inhalation Q6H  . linagliptin  5 mg Oral Daily  . ramipril  5 mg Oral BID  . sertraline  50 mg Oral Daily  . zinc sulfate  220 mg Oral Daily   Continuous Infusions: . sodium chloride 75 mL/hr at 06/26/20 0530  .  remdesivir 100 mg in NS 100 mL 100 mg (06/26/20 0752)     LOS: 2 days     Cordelia Poche, MD [facility] 06/26/2020, 1:01 PM  If 7PM-7AM, please contact night-coverage [email or web address]

## 2020-06-26 NOTE — Progress Notes (Signed)
OT Cancellation Note  Patient Details Name: MAVRICK MCQUIGG MRN: [id number] DOB: 19-May-1946   Cancelled Treatment:    Reason Eval/Treat Not Completed: OT screened, no needs identified, will sign off. Per conversation c PT, pt near baseline for mobility. Pt endorses fatigue main limiting factor for ADLs this date, receptive to ECS handout and endorses need for 3-in-1 commode upon d/c home. Pt has no skilled acute OT needs identified. Will sign off, please re-consult if new needs arise.  Dessie Coma, M.S. OTR/L  06/26/20, 3:03 PM  ascom [phone]

## 2020-06-27 LAB — COMPREHENSIVE METABOLIC PANEL
ALT: 22 U/L (ref 0–44)
AST: 15 U/L (ref 15–41)
Albumin: 2.5 g/dL — ABNORMAL LOW (ref 3.5–5.0)
Alkaline Phosphatase: 59 U/L (ref 38–126)
Anion gap: 7 (ref 5–15)
BUN: 24 mg/dL — ABNORMAL HIGH (ref 8–23)
CO2: 23 mmol/L (ref 22–32)
Calcium: 8.4 mg/dL — ABNORMAL LOW (ref 8.9–10.3)
Chloride: 109 mmol/L (ref 98–111)
Creatinine, Ser: 1.24 mg/dL (ref 0.61–1.24)
GFR calc Af Amer: >60 mL/min (ref >60)
GFR calc non Af Amer: 57 mL/min — ABNORMAL LOW (ref >60)
Glucose, Bld: 105 mg/dL — ABNORMAL HIGH (ref 70–99)
Potassium: 3.6 mmol/L (ref 3.5–5.1)
Sodium: 139 mmol/L (ref 135–145)
Total Bilirubin: 0.6 mg/dL (ref 0.3–1.2)
Total Protein: 6.2 g/dL — ABNORMAL LOW (ref 6.5–8.1)

## 2020-06-27 LAB — FIBRIN DERIVATIVES D-DIMER (ARMC ONLY): Fibrin derivatives D-dimer (ARMC): 844.12 ng/mL (FEU) — ABNORMAL HIGH (ref 0.00–499.00)

## 2020-06-27 LAB — CBC WITH DIFFERENTIAL/PLATELET
Abs Immature Granulocytes: 0.24 10*3/uL — ABNORMAL HIGH (ref 0.00–0.07)
Basophils Absolute: 0 10*3/uL (ref 0.0–0.1)
Basophils Relative: 0 %
Eosinophils Absolute: 0 10*3/uL (ref 0.0–0.5)
Eosinophils Relative: 0 %
HCT: 31.6 % — ABNORMAL LOW (ref 39.0–52.0)
Hemoglobin: 10.9 g/dL — ABNORMAL LOW (ref 13.0–17.0)
Immature Granulocytes: 2 %
Lymphocytes Relative: 11 %
Lymphs Abs: 1.3 10*3/uL (ref 0.7–4.0)
MCH: 28.4 pg (ref 26.0–34.0)
MCHC: 34.5 g/dL (ref 30.0–36.0)
MCV: 82.3 fL (ref 80.0–100.0)
Monocytes Absolute: 1.1 10*3/uL — ABNORMAL HIGH (ref 0.1–1.0)
Monocytes Relative: 10 %
Neutro Abs: 9 10*3/uL — ABNORMAL HIGH (ref 1.7–7.7)
Neutrophils Relative %: 77 %
Platelets: 314 10*3/uL (ref 150–400)
RBC: 3.84 MIL/uL — ABNORMAL LOW (ref 4.22–5.81)
RDW: 14.4 % (ref 11.5–15.5)
WBC: 11.7 10*3/uL — ABNORMAL HIGH (ref 4.0–10.5)
nRBC: 0 % (ref 0.0–0.2)

## 2020-06-27 LAB — FERRITIN: Ferritin: 332 ng/mL (ref 24–336)

## 2020-06-27 LAB — GLUCOSE, CAPILLARY
Glucose-Capillary: 151 mg/dL — ABNORMAL HIGH (ref 70–99)
Glucose-Capillary: 103 mg/dL — ABNORMAL HIGH (ref 70–99)
Glucose-Capillary: 103 mg/dL — ABNORMAL HIGH (ref 70–99)
Glucose-Capillary: 215 mg/dL — ABNORMAL HIGH (ref 70–99)
Glucose-Capillary: 250 mg/dL — ABNORMAL HIGH (ref 70–99)
Glucose-Capillary: 233 mg/dL — ABNORMAL HIGH (ref 70–99)

## 2020-06-27 LAB — PHOSPHORUS: Phosphorus: 3.5 mg/dL (ref 2.5–4.6)

## 2020-06-27 LAB — MAGNESIUM: Magnesium: 2.4 mg/dL (ref 1.7–2.4)

## 2020-06-27 LAB — C-REACTIVE PROTEIN: CRP: 0.6 mg/dL (ref <1.0)

## 2020-06-27 NOTE — Plan of Care (Signed)
  Problem: Education: Goal: Knowledge of risk factors and measures for prevention of condition will improve Outcome: Progressing   Problem: Coping: Goal: Psychosocial and spiritual needs will be supported Outcome: Progressing   Problem: Respiratory: Goal: Will maintain a patent airway Outcome: Progressing Goal: Complications related to the disease process, condition or treatment will be avoided or minimized Outcome: Progressing   

## 2020-06-27 NOTE — Progress Notes (Signed)
PROGRESS NOTE    Gilbert Obrien  ZJQ:[id number] DOB: 09-26-1945 DOA: 06/24/2020 PCP: [name], MD   Brief Narrative: Gilbert Obrien is a 74 y.o. male with a known history of coronary artery disease, stroke, diabetes, depression, hypertension, hyperlipidemia, CKD stage III. Patient presented secondary to dyspnea in setting of COVID-19 pneumonia.   Assessment & Plan:   Active Problems:   Type II diabetes mellitus with renal manifestations (HCC)   CKD (chronic kidney disease), stage IIIa   HLD (hyperlipidemia)   Essential hypertension   Chronic diastolic CHF (congestive heart failure) ([place])   COVID-19 virus infection   COVID-19 pneumonia Patient is unvaccinated. Started on Remdesivir and Decadron. Ferritin, D-dimer trending down. CRP trended down -Continue Remdesivir and Decadron -Daily CBC, CMP, CRP, Ferritin, D-dimer  Acute respiratory failure with hypoxia Weaned to 2L via [place] -Continue oxygen, wean to room air as able; keep O2 saturations >92%  Essential hypertension -Continue Coreg, ramipril, amlodipine  Depression -Continue Zoloft, Restoril  Diabetes mellitus, type 2 -Continue Levemir and SSI -Continue Tradjenta  Hyperlipidemia -Continue Lipitor  CKD stage IIIa Stable.  Chronic diastolic heart failure -Continue Lasix   DVT prophylaxis: Lovenox Code Status:   Code Status: Full Code Family Communication: None at bedside Disposition Plan: Discharge likely home 1 day pending completion of Remdesivir   Consultants:   None  Procedures:   None  Antimicrobials:  Remdesivir IV    Subjective: Continues to feel better daily. Eager to ambulate. Mild dyspnea with exertion.  Objective: Vitals:   06/27/20 0109 06/27/20 0444 06/27/20 0729 06/27/20 0845  BP: (!) 141/72 (!) 145/84 133/78   Pulse: 68 60 63   Resp: 16 18 16    Temp: 97.8 F (36.6 C) 97.7 F (36.5 C) 98.6 F (37 C)   TempSrc: Oral Oral    SpO2: 97% 94% 96% 94%  Weight:       Height:        Intake/Output Summary (Last 24 hours) at 06/27/2020 1113 Last data filed at 06/27/2020 0448 Gross per 24 hour  Intake --  Output 1375 ml  Net -1375 ml   Filed Weights   06/24/20 1220  Weight: 81.6 kg    Examination:  General exam: Appears calm and comfortable Respiratory system: Clear to auscultation. Respiratory effort normal. Cardiovascular system: S1 & S2 heard, RRR. No murmurs, rubs, gallops or clicks. Gastrointestinal system: Abdomen is distended, soft and nontender. No organomegaly or masses felt. Normal bowel sounds heard. Central nervous system: Alert and oriented. No focal neurological deficits. Musculoskeletal: No edema. No calf tenderness Skin: No cyanosis. No rashes Psychiatry: Judgement and insight appear normal. Mood & affect appropriate.    Data Reviewed: I have personally reviewed following labs and imaging studies  CBC Lab Results  Component Value Date   WBC 11.7 (H) 06/27/2020   RBC 3.84 (L) 06/27/2020   HGB 10.9 (L) 06/27/2020   HCT 31.6 (L) 06/27/2020   MCV 82.3 06/27/2020   MCH 28.4 06/27/2020   PLT 314 06/27/2020   MCHC 34.5 06/27/2020   RDW 14.4 06/27/2020   LYMPHSABS 1.3 06/27/2020   MONOABS 1.1 (H) 06/27/2020   EOSABS 0.0 06/27/2020   BASOSABS 0.0 24/05/7352     Last metabolic panel Lab Results  Component Value Date   NA 139 06/27/2020   K 3.6 06/27/2020   CL 109 06/27/2020   CO2 23 06/27/2020   BUN 24 (H) 06/27/2020   CREATININE 1.24 06/27/2020   GLUCOSE 105 (H) 06/27/2020  GFRNONAA 57 (L) 06/27/2020   GFRAA >60 06/27/2020   CALCIUM 8.4 (L) 06/27/2020   PHOS 3.5 06/27/2020   PROT 6.2 (L) 06/27/2020   ALBUMIN 2.5 (L) 06/27/2020   BILITOT 0.6 06/27/2020   ALKPHOS 59 06/27/2020   AST 15 06/27/2020   ALT 22 06/27/2020   ANIONGAP 7 06/27/2020    CBG (last 3)  Recent Labs    06/27/20 0109 06/27/20 0445 06/27/20 0744  GLUCAP 151* 103* 103*     GFR: Estimated Creatinine Clearance: 52.6 mL/min (by C-G  formula based on SCr of 1.24 mg/dL).  Coagulation Profile: No results for input(s): INR, PROTIME in the last 168 hours.  Recent Results (from the past 240 hour(s))  Respiratory Panel by RT PCR (Flu A&B, Covid) - Nasopharyngeal Swab     Status: Abnormal   Collection Time: 06/24/20 12:43 PM   Specimen: Nasopharyngeal Swab  Result Value Ref Range Status   SARS Coronavirus 2 by RT PCR POSITIVE (A) NEGATIVE Final    Comment: RESULT CALLED TO, READ BACK BY AND VERIFIED WITH: STEPHANIE RUDD AT 1335 ON 06/24/2020 [place]. (NOTE) SARS-CoV-2 target nucleic acids are DETECTED.  SARS-CoV-2 RNA is generally detectable in upper respiratory specimens  during the acute phase of infection. Positive results are indicative of the presence of the identified virus, but do not rule out bacterial infection or co-infection with other pathogens not detected by the test. Clinical correlation with patient history and other diagnostic information is necessary to determine patient infection status. The expected result is Negative.  Fact Sheet for Patients:  PinkCheek.be  Fact Sheet for Healthcare Providers: GravelBags.it  This test is not yet approved or cleared by the [place] FDA and  has been authorized for detection and/or diagnosis of SARS-CoV-2 by FDA under an Emergency Use Authorization (EUA).  This EUA will remain in effect (meaning this test c an be used) for the duration of  the COVID-19 declaration under Section 564(b)(1) of the Act, 21 U.S.C. section 360bbb-3(b)(1), unless the authorization is terminated or revoked sooner.      Influenza A by PCR NEGATIVE NEGATIVE Final   Influenza B by PCR NEGATIVE NEGATIVE Final    Comment: (NOTE) The Xpert Xpress SARS-CoV-2/FLU/RSV assay is intended as an aid in  the diagnosis of influenza from Nasopharyngeal swab specimens and  should not be used as a sole basis for treatment. Nasal washings and   aspirates are unacceptable for Xpert Xpress SARS-CoV-2/FLU/RSV  testing.  Fact Sheet for Patients: PinkCheek.be  Fact Sheet for Healthcare Providers: GravelBags.it  This test is not yet approved or cleared by the [place] FDA and  has been authorized for detection and/or diagnosis of SARS-CoV-2 by  FDA under an Emergency Use Authorization (EUA). This EUA will remain  in effect (meaning this test can be used) for the duration of the  Covid-19 declaration under Section 564(b)(1) of the Act, 21  U.S.C. section 360bbb-3(b)(1), unless the authorization is  terminated or revoked. Performed at [facility], [address] [place], [place] [address]   Culture, sputum-assessment     Status: None   Collection Time: 06/25/20  3:58 AM   Specimen: Sputum  Result Value Ref Range Status   Specimen Description SPUTUM  Final   Special Requests NONE  Final   Sputum evaluation   Final    Sputum specimen not acceptable for testing.  Please recollect.   Performed at [facility], [address], [place], [place] [address]    Report  Status 06/25/2020 FINAL  Final        Radiology Studies: No results found.      Scheduled Meds:  amLODipine  10 mg Oral Daily   vitamin C  500 mg Oral Daily   atorvastatin  80 mg Oral q1800   carvedilol  25 mg Oral BID WC   dexamethasone (DECADRON) injection  6 mg Intravenous Q24H   docusate sodium  100 mg Oral BID   enoxaparin (LOVENOX) injection  40 mg Subcutaneous Q24H   furosemide  20 mg Oral Daily   insulin aspart  0-15 Units Subcutaneous Q4H   insulin detemir  0.1 Units/kg Subcutaneous BID   Ipratropium-Albuterol  1 puff Inhalation Q6H   linagliptin  5 mg Oral Daily   ramipril  5 mg Oral BID   sertraline  50 mg Oral Daily   zinc sulfate  220 mg Oral Daily   Continuous Infusions:  sodium chloride Stopped (06/26/20 1338)   remdesivir 100 mg in  NS 100 mL 100 mg (06/27/20 0818)     LOS: 3 days     Cordelia Poche, MD [facility] 06/27/2020, 11:13 AM  If 7PM-7AM, please contact night-coverage [email or web address]

## 2020-06-27 NOTE — Progress Notes (Signed)
Called patients daughter Abigail Butts and gave an update.

## 2020-06-28 DIAGNOSIS — J9601 Acute respiratory failure with hypoxia: Secondary | ICD-10-CM

## 2020-06-28 HISTORY — DX: Acute respiratory failure with hypoxia: J96.01

## 2020-06-28 LAB — CBC WITH DIFFERENTIAL/PLATELET
Abs Immature Granulocytes: 0.58 10*3/uL — ABNORMAL HIGH (ref 0.00–0.07)
Basophils Absolute: 0.1 10*3/uL (ref 0.0–0.1)
Basophils Relative: 1 %
Eosinophils Absolute: 0 10*3/uL (ref 0.0–0.5)
Eosinophils Relative: 0 %
HCT: 33.5 % — ABNORMAL LOW (ref 39.0–52.0)
Hemoglobin: 11.6 g/dL — ABNORMAL LOW (ref 13.0–17.0)
Immature Granulocytes: 5 %
Lymphocytes Relative: 12 %
Lymphs Abs: 1.4 10*3/uL (ref 0.7–4.0)
MCH: 28.5 pg (ref 26.0–34.0)
MCHC: 34.6 g/dL (ref 30.0–36.0)
MCV: 82.3 fL (ref 80.0–100.0)
Monocytes Absolute: 1.3 10*3/uL — ABNORMAL HIGH (ref 0.1–1.0)
Monocytes Relative: 11 %
Neutro Abs: 8.3 10*3/uL — ABNORMAL HIGH (ref 1.7–7.7)
Neutrophils Relative %: 71 %
Platelets: 332 10*3/uL (ref 150–400)
RBC: 4.07 MIL/uL — ABNORMAL LOW (ref 4.22–5.81)
RDW: 14.6 % (ref 11.5–15.5)
WBC: 11.7 10*3/uL — ABNORMAL HIGH (ref 4.0–10.5)
nRBC: 0 % (ref 0.0–0.2)

## 2020-06-28 LAB — COMPREHENSIVE METABOLIC PANEL
ALT: 32 U/L (ref 0–44)
AST: 22 U/L (ref 15–41)
Albumin: 2.5 g/dL — ABNORMAL LOW (ref 3.5–5.0)
Alkaline Phosphatase: 53 U/L (ref 38–126)
Anion gap: 5 (ref 5–15)
BUN: 33 mg/dL — ABNORMAL HIGH (ref 8–23)
CO2: 29 mmol/L (ref 22–32)
Calcium: 8.1 mg/dL — ABNORMAL LOW (ref 8.9–10.3)
Chloride: 107 mmol/L (ref 98–111)
Creatinine, Ser: 1.27 mg/dL — ABNORMAL HIGH (ref 0.61–1.24)
GFR calc Af Amer: >60 mL/min (ref >60)
GFR calc non Af Amer: 55 mL/min — ABNORMAL LOW (ref >60)
Glucose, Bld: 97 mg/dL (ref 70–99)
Potassium: 3.6 mmol/L (ref 3.5–5.1)
Sodium: 141 mmol/L (ref 135–145)
Total Bilirubin: 0.7 mg/dL (ref 0.3–1.2)
Total Protein: 6.1 g/dL — ABNORMAL LOW (ref 6.5–8.1)

## 2020-06-28 LAB — FERRITIN: Ferritin: 285 ng/mL (ref 24–336)

## 2020-06-28 LAB — GLUCOSE, CAPILLARY
Glucose-Capillary: 100 mg/dL — ABNORMAL HIGH (ref 70–99)
Glucose-Capillary: 110 mg/dL — ABNORMAL HIGH (ref 70–99)
Glucose-Capillary: 125 mg/dL — ABNORMAL HIGH (ref 70–99)
Glucose-Capillary: 192 mg/dL — ABNORMAL HIGH (ref 70–99)
Glucose-Capillary: 199 mg/dL — ABNORMAL HIGH (ref 70–99)
Glucose-Capillary: 267 mg/dL — ABNORMAL HIGH (ref 70–99)

## 2020-06-28 LAB — C-REACTIVE PROTEIN: CRP: 0.5 mg/dL (ref <1.0)

## 2020-06-28 LAB — FIBRIN DERIVATIVES D-DIMER (ARMC ONLY): Fibrin derivatives D-dimer (ARMC): 762.76 ng/mL (FEU) — ABNORMAL HIGH (ref 0.00–499.00)

## 2020-06-28 LAB — MAGNESIUM: Magnesium: 2.4 mg/dL (ref 1.7–2.4)

## 2020-06-28 LAB — PHOSPHORUS: Phosphorus: 3.3 mg/dL (ref 2.5–4.6)

## 2020-06-28 MED ORDER — PREDNISONE 20 MG PO TABS
40.0000 mg | ORAL_TABLET | Freq: Every day | ORAL | Status: DC
  Administered 2020-06-29 – 2020-07-01 (×3): 40 mg via ORAL
  Filled 2020-06-28 (×3): qty 2

## 2020-06-28 NOTE — Progress Notes (Signed)
Inpatient Diabetes Program Recommendations  AACE/ADA: New Consensus Statement on Inpatient Glycemic Control   Target Ranges:  Prepandial:   less than 140 mg/dL      Peak postprandial:   less than 180 mg/dL (1-2 hours)      Critically ill patients:  140 - 180 mg/dL   Results for BRAYDAN, MARRIOTT (MRN [id number]) as of 06/28/2020 11:24  Ref. Range 06/27/2020 07:44 06/27/2020 12:10 06/27/2020 16:18 06/27/2020 20:23 06/28/2020 00:51 06/28/2020 05:06 06/28/2020 07:45  Glucose-Capillary Latest Ref Range: 70 - 99 mg/dL 103 (H) 215 (H) 250 (H) 233 (H) 125 (H) 110 (H) 100 (H)   Review of Glycemic Control  Diabetes history: DM2 Outpatient Diabetes medications: Amaryl 4 mg daily Current orders for Inpatient glycemic control: Levemir 8 units BID, Novolog 0-15 units Q4H, Tradjenta 5 QD; Decadron 6 mg Q24H  Inpatient Diabetes Program Recommendations:    Insulin: If steroids are continued, please consider ordering Novolog 4 units TID with meals for meal coverage if patient eats at least 50% of meals.  Thanks, Barnie Alderman, RN, MSN, CDE Diabetes Coordinator Inpatient Diabetes Program [phone] (Team Pager from 8am to 5pm)

## 2020-06-28 NOTE — Progress Notes (Addendum)
Pt completed home 02 evaluation x 2 today. On both occasions results are listed below.   Sp02 at rest is 95% and above on 2L [place]  Sp02 while ambulating on 3L [place] 74% at lowest saturation  Pt currently unable to maintain Sp02 saturation above 90% on 6L [place] while ambulating, Highest Sp02 level maintained on 6L [place] while walking/standing is 88%           Pt AAox4 and pleasant, Up ad lib and able to perform ADLs independently. Pt with no complaints today. Pt endorses some SOB after ambulating. Pt has good appetitie. Initial instruction for incentive spriometer provided with good effort from Pt and encouraged to prone. Pt received IV remdesivir. Insulin coverage given for BG control. Will continue to monitor.

## 2020-06-28 NOTE — TOC Initial Note (Addendum)
Transition of Care [facility]) - Initial/Assessment Note    Patient Details  Name: Gilbert Obrien MRN: [id number] Date of Birth: [birth year]  Transition of Care [facility]) CM/SW Contact:    Gilbert Price, RN Phone Number: 06/28/2020, 11:30 AM  Clinical Narrative:      06/28/20 1222: Spoke with patient via phone, states he is feeling better though some concerns about feeling more short of breath today.   Goals for discharge, "To get my breath back up, if I can do that everything else will follow".    Confirmed contact info, PCP, and pharmacy. Mobile number preferred is [phone]. Daughter, Gilbert Obrien, is additional contact information and permission to contact given verbally. Pharmacy is [facility] but can use [facility] [place] (N), [place] - [place] as preferred local pharmacy. Dr. Emily Obrien is PCP and see cardiologist, Gilbert Obrien in [place] when scheduled for follow ups.   Pt lives alone and was "very independent" prior to admission. 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 Does not feel he needs RN HH follow up once discharged. Discussed breath/shortness of breath may take a way to get back to normal and if is changes his mind to let some one know. Has no equipment at home prior to admission.   Left VM with daughter to check on with her.   No PT follow up ordered but patient states concern that he has not ambulated but 3 times in 2-3 weeks and feels weak today when RN ambulated around nurses station. Was unsure of numbers but said he thinks his value dropped to high 80's and he did feel subjectively short of breath. Currently on 2 L/[place].   Asked if he had questions and encourage to call CM back today or main CM number as needed.   1250: Gilbert Obrien, daughter, called back. Stated he initially had some memory issues on this admission, but seemed much improved when she spoke with him. Confirmed he was  independent and drives himself but she will be picking him up and getting him to any appointments or needs until he is stronger. Encourage her to ask questions or call back if needed. Gilbert Davies RN CM.                       Patient Goals and CMS Choice        Expected Discharge Plan and Services                                                Prior Living Arrangements/Services                       Activities of Daily Living Home Assistive Devices/Equipment: None ADL Screening (condition at time of admission) Patient's cognitive ability adequate to safely complete daily activities?: No Is the patient deaf or have difficulty hearing?: No Does the patient have difficulty seeing, even when wearing glasses/contacts?: No Does the patient have difficulty concentrating, remembering, or making decisions?: No Patient able to express need for assistance with ADLs?: No Does the patient have difficulty dressing or bathing?: No Independently performs ADLs?: Yes (appropriate for developmental age) Does the patient have difficulty walking or climbing stairs?: No Weakness of Legs: None Weakness of Arms/Hands:  None  Permission Sought/Granted                  Emotional Assessment              Admission diagnosis:  SOB (shortness of breath) [R06.02] Hypoxia [R09.02] Severe acute respiratory syndrome coronavirus 2 (SARS-CoV-2) vaccine not administered [Z28.9] COVID-19 virus infection [U07.1] COVID-19 [U07.1] Patient Active Problem List   Diagnosis Date Noted  . COVID-19 virus infection 06/24/2020  . Umbilical hernia without obstruction and without gangrene   . Coronary artery disease   . Depression   . Tobacco abuse   . Acute cholecystitis 03/25/2020  . Chronic diastolic CHF (congestive heart failure) ([place]) 11/23/2017  . Weakness   . History of stroke 10/03/2016  . History of CVA (cerebrovascular accident) 10/03/2016  . Abdominal pain   .  Cholecystitis 08/15/2016  . Essential hypertension 05/02/2016  . Coronary artery disease involving native heart without angina pectoris 05/01/2016  . HLD (hyperlipidemia) 05/01/2016  . Hypokalemia 04/19/2016  . Cardiomyopathy, ischemic 04/19/2016  . Type II diabetes mellitus with renal manifestations ([place]) 04/19/2016  . CKD (chronic kidney disease), stage IIIa 04/19/2016  . Hematoma of arm 04/19/2016  . History of myocardial infarction 04/15/2016   PCP:  Gilbert Aus, MD Pharmacy:   [facility] [address] (N), [place]) [place] [address] Phone: [phone] Fax: [facility] [place], [place] [address] Phone: [phone] Fax: [phone]  [id number] [address], [place] [address] Phone: [phone] Fax: [phone]     Social Determinants of Health (SDOH) Interventions    Readmission Risk Interventions No flowsheet data found.

## 2020-06-28 NOTE — Plan of Care (Signed)
  Problem: Education: Goal: Knowledge of risk factors and measures for prevention of condition will improve Outcome: Progressing   Problem: Coping: Goal: Psychosocial and spiritual needs will be supported Outcome: Progressing   Problem: Respiratory: Goal: Will maintain a patent airway Outcome: Progressing Goal: Complications related to the disease process, condition or treatment will be avoided or minimized Outcome: Progressing   

## 2020-06-28 NOTE — Care Management Important Message (Signed)
Important Message  Patient Details  Name: Gilbert Obrien MRN: [id number] Date of Birth: 08-31-1946   Medicare Important Message Given:  Yes  Reviewed with patient via room phone due to isolation status. Declined copy of Medicare IM at this time as one being sent to home address by Patient Access.     Arlynn Stare 06/28/2020, 1:25 PM

## 2020-06-28 NOTE — Progress Notes (Signed)
PROGRESS NOTE    Gilbert Obrien  DDU:[id number] DOB: Jan 28, 1946 DOA: 06/24/2020 PCP: [name], MD   Brief Narrative: Gilbert Obrien is a 74 y.o. male with a known history of coronary artery disease, stroke, diabetes, depression, hypertension, hyperlipidemia, CKD stage III. Patient presented secondary to dyspnea in setting of COVID-19 pneumonia.   Assessment & Plan:   Principal Problem:   Acute respiratory failure with hypoxia (HCC) Active Problems:   Type II diabetes mellitus with renal manifestations (HCC)   CKD (chronic kidney disease), stage IIIa   HLD (hyperlipidemia)   Essential hypertension   Chronic diastolic CHF (congestive heart failure) ([place])   COVID-19 virus infection   COVID-19 pneumonia Patient is unvaccinated. Started on Remdesivir and Decadron. Ferritin, D-dimer trending down. CRP trended down -Continue Remdesivir (last dose) -Prednisone taper -Daily CBC, CMP, CRP, Ferritin, D-dimer  Acute respiratory failure with hypoxia Weaned to 2L via [place] but patient requiring 6L of oxygen via [place] to get O2 saturations up to 88% -Continue oxygen, wean to room air as able; keep O2 saturations >92%  Essential hypertension -Continue Coreg, ramipril, amlodipine  Depression -Continue Zoloft, Restoril  Diabetes mellitus, type 2 -Continue Levemir and SSI -Continue Tradjenta  Hyperlipidemia -Continue Lipitor  CKD stage IIIa Stable.  Chronic diastolic heart failure -Continue Lasix   DVT prophylaxis: Lovenox Code Status:   Code Status: Full Code Family Communication: None at bedside Disposition Plan: Discharge likely home in 1-3 days pending ability for patient to ambulate with up to 6 L of oxygen via [place] and maintain oxygen saturation greater than 90%   Consultants:   None  Procedures:   None  Antimicrobials:  Remdesivir IV    Subjective: Feeling better but feels he needs to get up and ambulate  Objective: Vitals:   06/28/20 0051  06/28/20 0508 06/28/20 0814 06/28/20 1215  BP: 135/77 (!) 141/75 140/72 (!) 143/78  Pulse: 71 64 64 71  Resp: 18 16 17 16   Temp: 98.2 F (36.8 C) 97.6 F (36.4 C) (!) 97.5 F (36.4 C) 97.7 F (36.5 C)  TempSrc: Oral Oral Oral Oral  SpO2: 95% 95% 96% 93%  Weight:      Height:        Intake/Output Summary (Last 24 hours) at 06/28/2020 1403 Last data filed at 06/28/2020 1310 Gross per 24 hour  Intake --  Output 1350 ml  Net -1350 ml   Filed Weights   06/24/20 1220  Weight: 81.6 kg    Examination:  General exam: Appears calm and comfortable Respiratory system: Clear to auscultation. Respiratory effort normal. Cardiovascular system: S1 & S2 heard, RRR. No murmurs, rubs, gallops or clicks. Gastrointestinal system: Abdomen is distended, soft and nontender. No organomegaly or masses felt. Normal bowel sounds heard. Central nervous system: Alert and oriented. No focal neurological deficits. Musculoskeletal: No edema. No calf tenderness Skin: No cyanosis. No rashes Psychiatry: Judgement and insight appear normal. Mood & affect appropriate.    Data Reviewed: I have personally reviewed following labs and imaging studies  CBC Lab Results  Component Value Date   WBC 11.7 (H) 06/28/2020   RBC 4.07 (L) 06/28/2020   HGB 11.6 (L) 06/28/2020   HCT 33.5 (L) 06/28/2020   MCV 82.3 06/28/2020   MCH 28.5 06/28/2020   PLT 332 06/28/2020   MCHC 34.6 06/28/2020   RDW 14.6 06/28/2020   LYMPHSABS 1.4 06/28/2020   MONOABS 1.3 (H) 06/28/2020   EOSABS 0.0 06/28/2020   BASOSABS 0.1 06/28/2020  Last metabolic panel Lab Results  Component Value Date   NA 141 06/28/2020   K 3.6 06/28/2020   CL 107 06/28/2020   CO2 29 06/28/2020   BUN 33 (H) 06/28/2020   CREATININE 1.27 (H) 06/28/2020   GLUCOSE 97 06/28/2020   GFRNONAA 55 (L) 06/28/2020   GFRAA >60 06/28/2020   CALCIUM 8.1 (L) 06/28/2020   PHOS 3.3 06/28/2020   PROT 6.1 (L) 06/28/2020   ALBUMIN 2.5 (L) 06/28/2020   BILITOT 0.7  06/28/2020   ALKPHOS 53 06/28/2020   AST 22 06/28/2020   ALT 32 06/28/2020   ANIONGAP 5 06/28/2020    CBG (last 3)  Recent Labs    06/28/20 0506 06/28/20 0745 06/28/20 1155  GLUCAP 110* 100* 199*     GFR: Estimated Creatinine Clearance: 51.4 mL/min (A) (by C-G formula based on SCr of 1.27 mg/dL (H)).  Coagulation Profile: No results for input(s): INR, PROTIME in the last 168 hours.  Recent Results (from the past 240 hour(s))  Respiratory Panel by RT PCR (Flu A&B, Covid) - Nasopharyngeal Swab     Status: Abnormal   Collection Time: 06/24/20 12:43 PM   Specimen: Nasopharyngeal Swab  Result Value Ref Range Status   SARS Coronavirus 2 by RT PCR POSITIVE (A) NEGATIVE Final    Comment: RESULT CALLED TO, READ BACK BY AND VERIFIED WITH: STEPHANIE RUDD AT 1335 ON 06/24/2020 [place]. (NOTE) SARS-CoV-2 target nucleic acids are DETECTED.  SARS-CoV-2 RNA is generally detectable in upper respiratory specimens  during the acute phase of infection. Positive results are indicative of the presence of the identified virus, but do not rule out bacterial infection or co-infection with other pathogens not detected by the test. Clinical correlation with patient history and other diagnostic information is necessary to determine patient infection status. The expected result is Negative.  Fact Sheet for Patients:  PinkCheek.be  Fact Sheet for Healthcare Providers: GravelBags.it  This test is not yet approved or cleared by the [place] FDA and  has been authorized for detection and/or diagnosis of SARS-CoV-2 by FDA under an Emergency Use Authorization (EUA).  This EUA will remain in effect (meaning this test c an be used) for the duration of  the COVID-19 declaration under Section 564(b)(1) of the Act, 21 U.S.C. section 360bbb-3(b)(1), unless the authorization is terminated or revoked sooner.      Influenza A by PCR NEGATIVE  NEGATIVE Final   Influenza B by PCR NEGATIVE NEGATIVE Final    Comment: (NOTE) The Xpert Xpress SARS-CoV-2/FLU/RSV assay is intended as an aid in  the diagnosis of influenza from Nasopharyngeal swab specimens and  should not be used as a sole basis for treatment. Nasal washings and  aspirates are unacceptable for Xpert Xpress SARS-CoV-2/FLU/RSV  testing.  Fact Sheet for Patients: PinkCheek.be  Fact Sheet for Healthcare Providers: GravelBags.it  This test is not yet approved or cleared by the [place] FDA and  has been authorized for detection and/or diagnosis of SARS-CoV-2 by  FDA under an Emergency Use Authorization (EUA). This EUA will remain  in effect (meaning this test can be used) for the duration of the  Covid-19 declaration under Section 564(b)(1) of the Act, 21  U.S.C. section 360bbb-3(b)(1), unless the authorization is  terminated or revoked. Performed at [facility], [address] [place], [place] [address]   Culture, sputum-assessment     Status: None   Collection Time: 06/25/20  3:58 AM   Specimen: Sputum  Result Value Ref Range Status  Specimen Description SPUTUM  Final   Special Requests NONE  Final   Sputum evaluation   Final    Sputum specimen not acceptable for testing.  Please recollect.   Performed at [facility], [address], [place], [place] [address]    Report Status 06/25/2020 FINAL  Final        Radiology Studies: No results found.      Scheduled Meds:  amLODipine  10 mg Oral Daily   vitamin C  500 mg Oral Daily   atorvastatin  80 mg Oral q1800   carvedilol  25 mg Oral BID WC   docusate sodium  100 mg Oral BID   enoxaparin (LOVENOX) injection  40 mg Subcutaneous Q24H   furosemide  20 mg Oral Daily   insulin aspart  0-15 Units Subcutaneous Q4H   insulin detemir  0.1 Units/kg Subcutaneous BID   Ipratropium-Albuterol  1 puff Inhalation Q6H    linagliptin  5 mg Oral Daily   [START ON 06/29/2020] predniSONE  40 mg Oral Q breakfast   ramipril  5 mg Oral BID   sertraline  50 mg Oral Daily   zinc sulfate  220 mg Oral Daily   Continuous Infusions:  sodium chloride Stopped (06/26/20 1338)     LOS: 4 days     Cordelia Poche, MD [facility] 06/28/2020, 2:03 PM  If 7PM-7AM, please contact night-coverage [email or web address]

## 2020-06-29 LAB — COMPREHENSIVE METABOLIC PANEL
ALT: 34 U/L (ref 0–44)
AST: 22 U/L (ref 15–41)
Albumin: 2.5 g/dL — ABNORMAL LOW (ref 3.5–5.0)
Alkaline Phosphatase: 58 U/L (ref 38–126)
Anion gap: 9 (ref 5–15)
BUN: 33 mg/dL — ABNORMAL HIGH (ref 8–23)
CO2: 25 mmol/L (ref 22–32)
Calcium: 7.9 mg/dL — ABNORMAL LOW (ref 8.9–10.3)
Chloride: 105 mmol/L (ref 98–111)
Creatinine, Ser: 1.18 mg/dL (ref 0.61–1.24)
GFR calc Af Amer: >60 mL/min (ref >60)
GFR calc non Af Amer: >60 mL/min (ref >60)
Glucose, Bld: 149 mg/dL — ABNORMAL HIGH (ref 70–99)
Potassium: 3.4 mmol/L — ABNORMAL LOW (ref 3.5–5.1)
Sodium: 139 mmol/L (ref 135–145)
Total Bilirubin: 0.6 mg/dL (ref 0.3–1.2)
Total Protein: 5.9 g/dL — ABNORMAL LOW (ref 6.5–8.1)

## 2020-06-29 LAB — GLUCOSE, CAPILLARY
Glucose-Capillary: 142 mg/dL — ABNORMAL HIGH (ref 70–99)
Glucose-Capillary: 153 mg/dL — ABNORMAL HIGH (ref 70–99)
Glucose-Capillary: 156 mg/dL — ABNORMAL HIGH (ref 70–99)
Glucose-Capillary: 162 mg/dL — ABNORMAL HIGH (ref 70–99)
Glucose-Capillary: 265 mg/dL — ABNORMAL HIGH (ref 70–99)
Glucose-Capillary: 270 mg/dL — ABNORMAL HIGH (ref 70–99)

## 2020-06-29 LAB — FERRITIN: Ferritin: 274 ng/mL (ref 24–336)

## 2020-06-29 LAB — MAGNESIUM: Magnesium: 2.5 mg/dL — ABNORMAL HIGH (ref 1.7–2.4)

## 2020-06-29 LAB — CBC WITH DIFFERENTIAL/PLATELET
Abs Immature Granulocytes: 0.54 10*3/uL — ABNORMAL HIGH (ref 0.00–0.07)
Basophils Absolute: 0 10*3/uL (ref 0.0–0.1)
Basophils Relative: 0 %
Eosinophils Absolute: 0 10*3/uL (ref 0.0–0.5)
Eosinophils Relative: 0 %
HCT: 32.8 % — ABNORMAL LOW (ref 39.0–52.0)
Hemoglobin: 11.1 g/dL — ABNORMAL LOW (ref 13.0–17.0)
Immature Granulocytes: 5 %
Lymphocytes Relative: 12 %
Lymphs Abs: 1.3 10*3/uL (ref 0.7–4.0)
MCH: 28.4 pg (ref 26.0–34.0)
MCHC: 33.8 g/dL (ref 30.0–36.0)
MCV: 83.9 fL (ref 80.0–100.0)
Monocytes Absolute: 1.2 10*3/uL — ABNORMAL HIGH (ref 0.1–1.0)
Monocytes Relative: 12 %
Neutro Abs: 7.5 10*3/uL (ref 1.7–7.7)
Neutrophils Relative %: 71 %
Platelets: 356 10*3/uL (ref 150–400)
RBC: 3.91 MIL/uL — ABNORMAL LOW (ref 4.22–5.81)
RDW: 14.6 % (ref 11.5–15.5)
WBC: 10.6 10*3/uL — ABNORMAL HIGH (ref 4.0–10.5)
nRBC: 0 % (ref 0.0–0.2)

## 2020-06-29 LAB — PHOSPHORUS: Phosphorus: 3.3 mg/dL (ref 2.5–4.6)

## 2020-06-29 LAB — C-REACTIVE PROTEIN: CRP: 0.6 mg/dL (ref <1.0)

## 2020-06-29 LAB — FIBRIN DERIVATIVES D-DIMER (ARMC ONLY): Fibrin derivatives D-dimer (ARMC): 707.87 ng/mL (FEU) — ABNORMAL HIGH (ref 0.00–499.00)

## 2020-06-29 NOTE — Plan of Care (Signed)
  Problem: Education: Goal: Knowledge of risk factors and measures for prevention of condition will improve 06/29/2020 2250 by Iona Coach, RN Outcome: Progressing 06/29/2020 2248 by Iona Coach, RN Outcome: Progressing   Problem: Coping: Goal: Psychosocial and spiritual needs will be supported 06/29/2020 2250 by Iona Coach, RN Outcome: Progressing 06/29/2020 2248 by Iona Coach, RN Outcome: Progressing   Problem: Respiratory: Goal: Will maintain a patent airway 06/29/2020 2250 by Iona Coach, RN Outcome: Progressing 06/29/2020 2248 by Iona Coach, RN Outcome: Progressing Goal: Complications related to the disease process, condition or treatment will be avoided or minimized Outcome: Progressing   Problem: Education: Goal: Knowledge of risk factors and measures for prevention of condition will improve Outcome: Progressing   Problem: Coping: Goal: Psychosocial and spiritual needs will be supported Outcome: Progressing   Problem: Respiratory: Goal: Will maintain a patent airway Outcome: Progressing Goal: Complications related to the disease process, condition or treatment will be avoided or minimized Outcome: Progressing

## 2020-06-29 NOTE — Progress Notes (Addendum)
Physical Therapy Treatment Patient Details Name: Gilbert Obrien MRN: [id number] DOB: 1946-03-23 Today's Date: 06/29/2020    History of Present Illness Gilbert Obrien is a 74 y.o. male with a known history of coronary artery disease, stroke, diabetes, depression, hypertension, hyperlipidemia, CKD stage III. Patient presented secondary to dyspnea in setting of COVID-19 pneumonia.    PT Comments    Pt was long sitting in bed upon arriving. He agrees to PT session and si cooperative and pleasant throughout. Pt voices readiness to DC home. Pt was on 2 L o2 upon arriving but did have to be increased to 6 L during ambulation due to desaturation to 82%. Pt unable to recover to >88% until seated rest was taken. Quickly recovered to 90s and pt able to return to 2 L at conclusion of session. Pt only desaturates with activity. He was sitting in recliner at conclusion of session with call bell in reach, chair alarm in place, and pt saturation in 90s on 2 L.       Follow Up Recommendations  No PT follow up     Equipment Recommendations  None recommended by PT    Recommendations for Other Services       Precautions / Restrictions Precautions Precautions: None Restrictions Weight Bearing Restrictions: No    Mobility  Bed Mobility Overal bed mobility: Modified Independent         Transfers Overall transfer level: Modified independent         Ambulation/Gait Ambulation/Gait assistance: Modified independent (Device/Increase time)           Balance Overall balance assessment: No apparent balance deficits (not formally assessed)       Cognition Arousal/Alertness: Awake/alert Behavior During Therapy: WFL for tasks assessed/performed Overall Cognitive Status: Within Functional Limits for tasks assessed     General Comments: Pt is A and O x 4             Pertinent Vitals/Pain Pain Assessment: No/denies pain           PT Goals (current goals can now be found in  the care plan section) Acute Rehab PT Goals Patient Stated Goal: go home Progress towards PT goals: Progressing toward goals    Frequency    Min 2X/week      PT Plan Current plan remains appropriate       AM-PAC PT "6 Clicks" Mobility   Outcome Measure  Help needed turning from your back to your side while in a flat bed without using bedrails?: None Help needed moving from lying on your back to sitting on the side of a flat bed without using bedrails?: None Help needed moving to and from a bed to a chair (including a wheelchair)?: None Help needed standing up from a chair using your arms (e.g., wheelchair or bedside chair)?: None Help needed to walk in hospital room?: None Help needed climbing 3-5 steps with a railing? : None 6 Click Score: 24    End of Session Equipment Utilized During Treatment: Oxygen Activity Tolerance: Patient tolerated treatment well Patient left: in chair;with call bell/phone within reach;with chair alarm set Nurse Communication: Mobility status PT Visit Diagnosis: Muscle weakness (generalized) (M62.81)     Time: 2637-8588 PT Time Calculation (min) (ACUTE ONLY): 23 min  Charges:  $Gait Training: 8-22 mins $Therapeutic Activity: 8-22 mins                     Julaine Fusi PTA 06/29/20, 12:55 PM

## 2020-06-29 NOTE — Progress Notes (Signed)
PROGRESS NOTE    JEROMIAH OHALLORAN  ZOX:[id number] DOB: 22-Sep-1946 DOA: 06/24/2020 PCP: [name], MD   Brief Narrative: Gilbert Obrien is a 74 y.o. male with a known history of coronary artery disease, stroke, diabetes, depression, hypertension, hyperlipidemia, CKD stage III. Patient presented secondary to dyspnea in setting of COVID-19 pneumonia.   Assessment & Plan:   Principal Problem:   Acute respiratory failure with hypoxia (HCC) Active Problems:   Type II diabetes mellitus with renal manifestations (HCC)   CKD (chronic kidney disease), stage IIIa   HLD (hyperlipidemia)   Essential hypertension   Chronic diastolic CHF (congestive heart failure) ([place])   COVID-19 virus infection   COVID-19 pneumonia Patient is unvaccinated. Started on Remdesivir and Decadron. Ferritin, D-dimer trended down. CRP trended down. Remdesivir completed. -Prednisone taper  Acute respiratory failure with hypoxia Weaned to 2L via [place] but patient requiring 6L of oxygen via [place] without ability to reach >88% -Continue oxygen, wean to room air as able; keep O2 saturations >92%  Essential hypertension -Continue Coreg, ramipril, amlodipine  Depression -Continue Zoloft, Restoril  Diabetes mellitus, type 2 -Continue Levemir and SSI -Continue Tradjenta  Hyperlipidemia -Continue Lipitor  CKD stage IIIa Stable.  Chronic diastolic heart failure -Continue Lasix   DVT prophylaxis: Lovenox Code Status:   Code Status: Full Code Family Communication: None at bedside Disposition Plan: Discharge likely home in 1-3 days pending ability for patient to ambulate with up to 6 L of oxygen via [place] and maintain oxygen saturation greater than 92%   Consultants:   None  Procedures:   None  Antimicrobials:  Remdesivir IV    Subjective: Feeling better than yesterday.  Objective: Vitals:   06/29/20 0435 06/29/20 0756 06/29/20 1014 06/29/20 1151  BP: 129/74 114/62 115/85 116/84  Pulse:  67 72 76 80  Resp: 17 16  18   Temp: 98.2 F (36.8 C) 98.6 F (37 C)  98.4 F (36.9 C)  TempSrc:  Oral  Oral  SpO2: 97% 95% 95% 90%  Weight:      Height:        Intake/Output Summary (Last 24 hours) at 06/29/2020 1302 Last data filed at 06/29/2020 0756 Gross per 24 hour  Intake 240 ml  Output 650 ml  Net -410 ml   Filed Weights   06/24/20 1220  Weight: 81.6 kg    Examination:  General exam: Appears calm and comfortable Respiratory system: Clear to auscultation. Respiratory effort normal. Cardiovascular system: S1 & S2 heard, RRR. No murmurs, rubs, gallops or clicks. Gastrointestinal system: Abdomen is distended, soft and nontender. No organomegaly or masses felt. Normal bowel sounds heard. Central nervous system: Alert and oriented. No focal neurological deficits. Musculoskeletal: No edema. No calf tenderness Skin: No cyanosis. No rashes Psychiatry: Judgement and insight appear normal. Mood & affect appropriate.    Data Reviewed: I have personally reviewed following labs and imaging studies  CBC Lab Results  Component Value Date   WBC 10.6 (H) 06/29/2020   RBC 3.91 (L) 06/29/2020   HGB 11.1 (L) 06/29/2020   HCT 32.8 (L) 06/29/2020   MCV 83.9 06/29/2020   MCH 28.4 06/29/2020   PLT 356 06/29/2020   MCHC 33.8 06/29/2020   RDW 14.6 06/29/2020   LYMPHSABS 1.3 06/29/2020   MONOABS 1.2 (H) 06/29/2020   EOSABS 0.0 06/29/2020   BASOSABS 0.0 81/19/1478     Last metabolic panel Lab Results  Component Value Date   NA 139 06/29/2020   K 3.4 (L) 06/29/2020  CL 105 06/29/2020   CO2 25 06/29/2020   BUN 33 (H) 06/29/2020   CREATININE 1.18 06/29/2020   GLUCOSE 149 (H) 06/29/2020   GFRNONAA >60 06/29/2020   GFRAA >60 06/29/2020   CALCIUM 7.9 (L) 06/29/2020   PHOS 3.3 06/29/2020   PROT 5.9 (L) 06/29/2020   ALBUMIN 2.5 (L) 06/29/2020   BILITOT 0.6 06/29/2020   ALKPHOS 58 06/29/2020   AST 22 06/29/2020   ALT 34 06/29/2020   ANIONGAP 9 06/29/2020    CBG (last 3)   Recent Labs    06/29/20 0434 06/29/20 0754 06/29/20 1130  GLUCAP 153* 142* 162*     GFR: Estimated Creatinine Clearance: 55.3 mL/min (by C-G formula based on SCr of 1.18 mg/dL).  Coagulation Profile: No results for input(s): INR, PROTIME in the last 168 hours.  Recent Results (from the past 240 hour(s))  Respiratory Panel by RT PCR (Flu A&B, Covid) - Nasopharyngeal Swab     Status: Abnormal   Collection Time: 06/24/20 12:43 PM   Specimen: Nasopharyngeal Swab  Result Value Ref Range Status   SARS Coronavirus 2 by RT PCR POSITIVE (A) NEGATIVE Final    Comment: RESULT CALLED TO, READ BACK BY AND VERIFIED WITH: STEPHANIE RUDD AT 1335 ON 06/24/2020 [place]. (NOTE) SARS-CoV-2 target nucleic acids are DETECTED.  SARS-CoV-2 RNA is generally detectable in upper respiratory specimens  during the acute phase of infection. Positive results are indicative of the presence of the identified virus, but do not rule out bacterial infection or co-infection with other pathogens not detected by the test. Clinical correlation with patient history and other diagnostic information is necessary to determine patient infection status. The expected result is Negative.  Fact Sheet for Patients:  PinkCheek.be  Fact Sheet for Healthcare Providers: GravelBags.it  This test is not yet approved or cleared by the [place] FDA and  has been authorized for detection and/or diagnosis of SARS-CoV-2 by FDA under an Emergency Use Authorization (EUA).  This EUA will remain in effect (meaning this test c an be used) for the duration of  the COVID-19 declaration under Section 564(b)(1) of the Act, 21 U.S.C. section 360bbb-3(b)(1), unless the authorization is terminated or revoked sooner.      Influenza A by PCR NEGATIVE NEGATIVE Final   Influenza B by PCR NEGATIVE NEGATIVE Final    Comment: (NOTE) The Xpert Xpress SARS-CoV-2/FLU/RSV assay is intended  as an aid in  the diagnosis of influenza from Nasopharyngeal swab specimens and  should not be used as a sole basis for treatment. Nasal washings and  aspirates are unacceptable for Xpert Xpress SARS-CoV-2/FLU/RSV  testing.  Fact Sheet for Patients: PinkCheek.be  Fact Sheet for Healthcare Providers: GravelBags.it  This test is not yet approved or cleared by the [place] FDA and  has been authorized for detection and/or diagnosis of SARS-CoV-2 by  FDA under an Emergency Use Authorization (EUA). This EUA will remain  in effect (meaning this test can be used) for the duration of the  Covid-19 declaration under Section 564(b)(1) of the Act, 21  U.S.C. section 360bbb-3(b)(1), unless the authorization is  terminated or revoked. Performed at [facility], [address] [place], [place] [address]   Culture, sputum-assessment     Status: None   Collection Time: 06/25/20  3:58 AM   Specimen: Sputum  Result Value Ref Range Status   Specimen Description SPUTUM  Final   Special Requests NONE  Final   Sputum evaluation   Final    Sputum  specimen not acceptable for testing.  Please recollect.   Performed at [facility], [address], [place], [place] [address]    Report Status 06/25/2020 FINAL  Final        Radiology Studies: No results found.      Scheduled Meds: . amLODipine  10 mg Oral Daily  . vitamin C  500 mg Oral Daily  . atorvastatin  80 mg Oral q1800  . carvedilol  25 mg Oral BID WC  . docusate sodium  100 mg Oral BID  . enoxaparin (LOVENOX) injection  40 mg Subcutaneous Q24H  . furosemide  20 mg Oral Daily  . insulin aspart  0-15 Units Subcutaneous Q4H  . insulin detemir  0.1 Units/kg Subcutaneous BID  . Ipratropium-Albuterol  1 puff Inhalation Q6H  . linagliptin  5 mg Oral Daily  . predniSONE  40 mg Oral Q breakfast  . ramipril  5 mg Oral BID  . sertraline  50 mg Oral Daily    . zinc sulfate  220 mg Oral Daily   Continuous Infusions: . sodium chloride Stopped (06/26/20 1338)     LOS: 5 days     Cordelia Poche, MD [facility] 06/29/2020, 1:02 PM  If 7PM-7AM, please contact night-coverage [email or web address]

## 2020-06-30 DIAGNOSIS — J9601 Acute respiratory failure with hypoxia: Secondary | ICD-10-CM

## 2020-06-30 LAB — GLUCOSE, CAPILLARY
Glucose-Capillary: 117 mg/dL — ABNORMAL HIGH (ref 70–99)
Glucose-Capillary: 135 mg/dL — ABNORMAL HIGH (ref 70–99)
Glucose-Capillary: 173 mg/dL — ABNORMAL HIGH (ref 70–99)
Glucose-Capillary: 229 mg/dL — ABNORMAL HIGH (ref 70–99)
Glucose-Capillary: 247 mg/dL — ABNORMAL HIGH (ref 70–99)
Glucose-Capillary: 302 mg/dL — ABNORMAL HIGH (ref 70–99)

## 2020-06-30 MED ORDER — FUROSEMIDE 10 MG/ML IJ SOLN
40.0000 mg | Freq: Once | INTRAMUSCULAR | Status: AC
  Administered 2020-06-30: 40 mg via INTRAVENOUS
  Filled 2020-06-30: qty 4

## 2020-06-30 NOTE — Progress Notes (Signed)
Pt has been weaned off continuous oxygen therapy, closely monitoring oxygen levels, and pt educated to press the call button incase he experiences SOB or any difficulty breathing.

## 2020-06-30 NOTE — TOC Progression Note (Signed)
Transition of Care [facility]) - Progression Note    Patient Details  Name: KORAY SOTER MRN: [id number] Date of Birth: 12/11/1945  [facility] of [facility] CM/SW Contact  Shelbie Hutching, RN Phone Number: 06/30/2020, 4:00 PM  Clinical Narrative:    This RNCM was able to follow up with patient today and discuss potential discharge for tomorrow.  Patient understands he will probably discharge with oxygen.  [employer] will provide oxygen, Andree Coss given referral for oxygen.  Patient agrees to home health nursing.  RNCM will talk with Abigail Butts, patient's daughter about home health choice and getting the patient a pulse oximeter for home, message left on her voicemail for return call.  Abigail Butts will be able to pick patient up at discharge.     Expected Discharge Plan: [place] Barriers to Discharge: Continued Medical Work up  Expected Discharge Plan and Services Expected Discharge Plan: [place]   Discharge Planning Services: CM Consult Post Acute Care Choice: [place] arrangements for the past 2 months: Single Family Home                 DME Arranged: Oxygen DME Agency: [facility] Date DME Agency Contacted: 06/30/20 Time DME Agency Contacted: [phone] Representative spoke with at DME Agency: [place]: RN           Social Determinants of Health ([place]) Interventions    Readmission Risk Interventions Readmission Risk Prevention Plan 06/28/2020  Transportation Screening Complete  PCP or Specialist Appt within 3-5 Days Complete  HRI or [place] Patient refused  Social Work Consult for [place] Planning/Counseling Complete  Palliative Care Screening Not Applicable  Medication Review Press photographer) Complete  Some recent data might be hidden

## 2020-06-30 NOTE — Progress Notes (Signed)
PROGRESS NOTE    Gilbert Obrien  NID:[id number] DOB: 07-Dec-1945 DOA: 06/24/2020 PCP: [name], MD  Brief Narrative:  Gilbert Obrien is a 74 y.o. malewith a known history of coronary artery disease, stroke, diabetes, depression, hypertension, hyperlipidemia, CKD stage III. Patient presented secondary to dyspnea in setting of COVID-19 pneumonia.  Has been steadily improving over interval.  Was able to wean from oxygen on room air but continues to require 4 L after desaturation in the 70s with ambulation.   Assessment & Plan:   Principal Problem:   Acute respiratory failure with hypoxia (HCC) Active Problems:   Type II diabetes mellitus with renal manifestations (HCC)   CKD (chronic kidney disease), stage IIIa   HLD (hyperlipidemia)   Essential hypertension   Chronic diastolic CHF (congestive heart failure) ([place])   COVID-19 virus infection  COVID-19 pneumonia Patient is unvaccinated.  Started on Remdesivir and Decadron.  Ferritin, D-dimer trended down.  CRP trended down.  Remdesivir completed. Plan: -Prednisone taper - IV Lasix challenge, 40 mg x 1 -Reattempt ambulation test later today -If patient is able to wean from supplemental oxygen requires no more than 2 L will be able to discharge.  Anticipated date of discharge 07/01/2020  Acute respiratory failure with hypoxia Documented hypoxia to 86 on room air Oxygen status has been slowly improving Weaned to room air at rest but requires 4 L with exertion due to desaturation to the 70s Plan: Continue oxygen, wean as tolerated Lasix challenge as above Incentive spirometry and prone as tolerated Reattempt ambulation later today and tomorrow.  If patient is able to maintain saturations requiring no more than 2 L supplemental oxygen will be able to discharge.  Anticipated discharge within 24 hours.   Essential hypertension -Continue Coreg, ramipril, amlodipine  Depression -Continue Zoloft,  Restoril  Diabetes mellitus, type 2 -Continue Levemir and SSI -Continue Tradjenta  Hyperlipidemia -Continue Lipitor  CKD stage IIIa Stable.  Chronic diastolic heart failure -Continue Lasix     DVT prophylaxis: Lovenox Code Status: Full Family Communication: None today.  Offered to call patient's family but patient declined Disposition Plan: Status is: Inpatient  Remains inpatient appropriate because:Inpatient level of care appropriate due to severity of illness   Dispo: The patient is from: Home              Anticipated d/c is to: Home              Anticipated d/c date is: 1 day              Patient currently is not medically stable to d/c. Weaned from supplemental oxygen at rest however continues to require 4 L with desaturation to the 70s on ambulation.  Will attempt Lasix IV challenge today and continue to stress prone incentive spirometry use.  Anticipated date of discharge 07/01/2020.   Consultants:   None  Procedures:   None  Antimicrobials:   Remdesivir, completed   Subjective: Seen and examined.  Improving shortness of breath.  Objective: Vitals:   06/30/20 0002 06/30/20 0426 06/30/20 0723 06/30/20 0800  BP: 126/66 129/69 (!) 144/77   Pulse: (!) 59 64 63   Resp: 16 16 18    Temp: 98 F (36.7 C) 97.7 F (36.5 C) 98.1 F (36.7 C)   TempSrc: Oral Oral Oral   SpO2: 95% 95% 95% (!) 88%  Weight:      Height:        Intake/Output Summary (Last 24 hours) at 06/30/2020 1023 Last data  filed at 06/30/2020 0900 Gross per 24 hour  Intake 360 ml  Output 200 ml  Net 160 ml   Filed Weights   06/24/20 1220  Weight: 81.6 kg    Examination:  General exam: Appears calm and comfortable  Respiratory system: Bilateral scattered crackles.  Normal work of breathing.  Room air at rest. Cardiovascular system: S1 & S2 heard, RRR. No JVD, murmurs, rubs, gallops or clicks. No pedal edema. Gastrointestinal system: Abdomen is nondistended, soft and nontender.  No organomegaly or masses felt. Normal bowel sounds heard. Central nervous system: Alert and oriented. No focal neurological deficits. Extremities: Symmetric 5 x 5 power. Skin: No rashes, lesions or ulcers Psychiatry: Judgement and insight appear normal. Mood & affect appropriate.     Data Reviewed: I have personally reviewed following labs and imaging studies  CBC: Recent Labs  Lab 06/25/20 0358 06/26/20 0529 06/27/20 0359 06/28/20 0427 06/29/20 0406  WBC 8.5 12.7* 11.7* 11.7* 10.6*  NEUTROABS 7.4 10.3* 9.0* 8.3* 7.5  HGB 10.7* 10.3* 10.9* 11.6* 11.1*  HCT 32.7* 30.3* 31.6* 33.5* 32.8*  MCV 85.4 84.6 82.3 82.3 83.9  PLT 291 290 314 332 883   Basic Metabolic Panel: Recent Labs  Lab 06/25/20 0358 06/26/20 0529 06/27/20 0359 06/28/20 0427 06/29/20 0406  NA 137 136 139 141 139  K 3.7 3.7 3.6 3.6 3.4*  CL 107 108 109 107 105  CO2 22 20* 23 29 25   GLUCOSE 234* 141* 105* 97 149*  BUN 18 20 24* 33* 33*  CREATININE 1.36* 1.23 1.24 1.27* 1.18  CALCIUM 8.1* 8.3* 8.4* 8.1* 7.9*  MG 2.3 2.4 2.4 2.4 2.5*  PHOS 2.3* 3.0 3.5 3.3 3.3   GFR: Estimated Creatinine Clearance: 55.3 mL/min (by C-G formula based on SCr of 1.18 mg/dL). Liver Function Tests: Recent Labs  Lab 06/25/20 0358 06/26/20 0529 06/27/20 0359 06/28/20 0427 06/29/20 0406  AST 19 14* 15 22 22   ALT 30 23 22  32 34  ALKPHOS 62 60 59 53 58  BILITOT 0.6 0.5 0.6 0.7 0.6  PROT 6.9 6.1* 6.2* 6.1* 5.9*  ALBUMIN 2.6* 2.5* 2.5* 2.5* 2.5*   No results for input(s): LIPASE, AMYLASE in the last 168 hours. No results for input(s): AMMONIA in the last 168 hours. Coagulation Profile: No results for input(s): INR, PROTIME in the last 168 hours. Cardiac Enzymes: No results for input(s): CKTOTAL, CKMB, CKMBINDEX, TROPONINI in the last 168 hours. BNP (last 3 results) No results for input(s): PROBNP in the last 8760 hours. HbA1C: No results for input(s): HGBA1C in the last 72 hours. CBG: Recent Labs  Lab 06/29/20 1553  06/29/20 1955 06/29/20 2359 06/30/20 0425 06/30/20 0725  GLUCAP 265* 270* 173* 135* 117*   Lipid Profile: No results for input(s): CHOL, HDL, LDLCALC, TRIG, CHOLHDL, LDLDIRECT in the last 72 hours. Thyroid Function Tests: No results for input(s): TSH, T4TOTAL, FREET4, T3FREE, THYROIDAB in the last 72 hours. Anemia Panel: Recent Labs    06/28/20 0427 06/29/20 0406  FERRITIN 285 274   Sepsis Labs: Recent Labs  Lab 06/24/20 1530  PROCALCITON <0.10    Recent Results (from the past 240 hour(s))  Respiratory Panel by RT PCR (Flu A&B, Covid) - Nasopharyngeal Swab     Status: Abnormal   Collection Time: 06/24/20 12:43 PM   Specimen: Nasopharyngeal Swab  Result Value Ref Range Status   SARS Coronavirus 2 by RT PCR POSITIVE (A) NEGATIVE Final    Comment: RESULT CALLED TO, READ BACK BY AND VERIFIED WITH: STEPHANIE RUDD  AT 1335 ON 06/24/2020 [place]. (NOTE) SARS-CoV-2 target nucleic acids are DETECTED.  SARS-CoV-2 RNA is generally detectable in upper respiratory specimens  during the acute phase of infection. Positive results are indicative of the presence of the identified virus, but do not rule out bacterial infection or co-infection with other pathogens not detected by the test. Clinical correlation with patient history and other diagnostic information is necessary to determine patient infection status. The expected result is Negative.  Fact Sheet for Patients:  PinkCheek.be  Fact Sheet for Healthcare Providers: GravelBags.it  This test is not yet approved or cleared by the [place] FDA and  has been authorized for detection and/or diagnosis of SARS-CoV-2 by FDA under an Emergency Use Authorization (EUA).  This EUA will remain in effect (meaning this test c an be used) for the duration of  the COVID-19 declaration under Section 564(b)(1) of the Act, 21 U.S.C. section 360bbb-3(b)(1), unless the authorization  is terminated or revoked sooner.      Influenza A by PCR NEGATIVE NEGATIVE Final   Influenza B by PCR NEGATIVE NEGATIVE Final    Comment: (NOTE) The Xpert Xpress SARS-CoV-2/FLU/RSV assay is intended as an aid in  the diagnosis of influenza from Nasopharyngeal swab specimens and  should not be used as a sole basis for treatment. Nasal washings and  aspirates are unacceptable for Xpert Xpress SARS-CoV-2/FLU/RSV  testing.  Fact Sheet for Patients: PinkCheek.be  Fact Sheet for Healthcare Providers: GravelBags.it  This test is not yet approved or cleared by the [place] FDA and  has been authorized for detection and/or diagnosis of SARS-CoV-2 by  FDA under an Emergency Use Authorization (EUA). This EUA will remain  in effect (meaning this test can be used) for the duration of the  Covid-19 declaration under Section 564(b)(1) of the Act, 21  U.S.C. section 360bbb-3(b)(1), unless the authorization is  terminated or revoked. Performed at [facility], [address] [place], [place] [address]   Culture, sputum-assessment     Status: None   Collection Time: 06/25/20  3:58 AM   Specimen: Sputum  Result Value Ref Range Status   Specimen Description SPUTUM  Final   Special Requests NONE  Final   Sputum evaluation   Final    Sputum specimen not acceptable for testing.  Please recollect.   Performed at [facility], [address], [place], [place] [address]    Report Status 06/25/2020 FINAL  Final         Radiology Studies: No results found.      Scheduled Meds: . amLODipine  10 mg Oral Daily  . vitamin C  500 mg Oral Daily  . atorvastatin  80 mg Oral q1800  . carvedilol  25 mg Oral BID WC  . docusate sodium  100 mg Oral BID  . enoxaparin (LOVENOX) injection  40 mg Subcutaneous Q24H  . furosemide  40 mg Intravenous Once  . insulin aspart  0-15 Units Subcutaneous Q4H  . insulin detemir   0.1 Units/kg Subcutaneous BID  . Ipratropium-Albuterol  1 puff Inhalation Q6H  . linagliptin  5 mg Oral Daily  . predniSONE  40 mg Oral Q breakfast  . ramipril  5 mg Oral BID  . sertraline  50 mg Oral Daily  . zinc sulfate  220 mg Oral Daily   Continuous Infusions:   LOS: 6 days    Time spent: 25 minutes    Sidney Ace, MD [facility] Pager [phone]  If 7PM-7AM,  please contact night-coverage 06/30/2020, 10:23 AM

## 2020-06-30 NOTE — Progress Notes (Signed)
Patient's daughter updated on plan of care and possible d/c home tomorrow. Daughter agreeable to plan.

## 2020-06-30 NOTE — Plan of Care (Signed)

## 2020-06-30 NOTE — Progress Notes (Signed)
Patient ambulated on RA with oxygen saturations dropping into low 70s. O2 applied at 2L with saturations dropping into low 80s. Saturations maintained at 88-92% with 4L O2 applied while ambulating. MD notified.

## 2020-07-01 ENCOUNTER — Encounter: Payer: Medicare HMO | Admitting: Physician Assistant

## 2020-07-01 LAB — GLUCOSE, CAPILLARY
Glucose-Capillary: 154 mg/dL — ABNORMAL HIGH (ref 70–99)
Glucose-Capillary: 229 mg/dL — ABNORMAL HIGH (ref 70–99)
Glucose-Capillary: 251 mg/dL — ABNORMAL HIGH (ref 70–99)
Glucose-Capillary: 263 mg/dL — ABNORMAL HIGH (ref 70–99)
Glucose-Capillary: 284 mg/dL — ABNORMAL HIGH (ref 70–99)
Glucose-Capillary: 55 mg/dL — ABNORMAL LOW (ref 70–99)
Glucose-Capillary: 95 mg/dL (ref 70–99)

## 2020-07-01 LAB — CREATININE, SERUM
Creatinine, Ser: 1.42 mg/dL — ABNORMAL HIGH (ref 0.61–1.24)
GFR calc non Af Amer: 48 mL/min — ABNORMAL LOW (ref >60)

## 2020-07-01 LAB — HEMOGLOBIN A1C
Hgb A1c MFr Bld: 7.8 % — ABNORMAL HIGH (ref 4.8–5.6)
Mean Plasma Glucose: 177.16 mg/dL

## 2020-07-01 MED ORDER — INSULIN ASPART 100 UNIT/ML ~~LOC~~ SOLN
6.0000 [IU] | Freq: Three times a day (TID) | SUBCUTANEOUS | Status: DC
  Administered 2020-07-01 – 2020-07-02 (×3): 6 [IU] via SUBCUTANEOUS
  Filled 2020-07-01 (×3): qty 1

## 2020-07-01 MED ORDER — METHYLPREDNISOLONE SODIUM SUCC 125 MG IJ SOLR
60.0000 mg | Freq: Once | INTRAMUSCULAR | Status: AC
  Administered 2020-07-01: 60 mg via INTRAVENOUS
  Filled 2020-07-01: qty 2

## 2020-07-01 MED ORDER — INSULIN ASPART 100 UNIT/ML ~~LOC~~ SOLN: 0.0000 [IU] | Freq: Three times a day (TID) | SUBCUTANEOUS | Status: DC

## 2020-07-01 MED ORDER — INSULIN ASPART 100 UNIT/ML ~~LOC~~ SOLN
0.0000 [IU] | Freq: Every day | SUBCUTANEOUS | Status: DC
  Administered 2020-07-01: 22:00:00 3 [IU] via SUBCUTANEOUS
  Filled 2020-07-01: qty 1

## 2020-07-01 MED ORDER — INSULIN ASPART 100 UNIT/ML ~~LOC~~ SOLN
0.0000 [IU] | Freq: Three times a day (TID) | SUBCUTANEOUS | Status: DC
  Administered 2020-07-01: 5 [IU] via SUBCUTANEOUS
  Administered 2020-07-01: 17:00:00 8 [IU] via SUBCUTANEOUS
  Administered 2020-07-02: 3 [IU] via SUBCUTANEOUS
  Filled 2020-07-01 (×3): qty 1

## 2020-07-01 MED ORDER — INSULIN DETEMIR 100 UNIT/ML ~~LOC~~ SOLN
10.0000 [IU] | Freq: Two times a day (BID) | SUBCUTANEOUS | Status: DC
  Administered 2020-07-01 – 2020-07-02 (×2): 10 [IU] via SUBCUTANEOUS
  Filled 2020-07-01 (×4): qty 0.1

## 2020-07-01 NOTE — Progress Notes (Signed)
Patient continues to need supplemental oxygen while ambulating. O2 sats 92-94% RA at rest. With ambulation O2 sats are as follows:  Low 70s with ambulation on RA Low 80s with ambulation on 2L [place] Application of 4L via [place] maintains sats 86 and above.

## 2020-07-01 NOTE — Progress Notes (Signed)
PROGRESS NOTE    Gilbert Obrien  FUX:[id number] DOB: 01/13/1946 DOA: 06/24/2020 PCP: [name], MD  Brief Narrative:  Gilbert Obrien is a 74 y.o. malewith a known history of coronary artery disease, stroke, diabetes, depression, hypertension, hyperlipidemia, CKD stage III. Patient presented secondary to dyspnea in setting of COVID-19 pneumonia.  Has been steadily improving over interval.  Was able to wean from oxygen on room air but continues to require 4 L after desaturation in the 70s with ambulation.    Assessment & Plan:   Principal Problem:   Acute respiratory failure with hypoxia (HCC) Active Problems:   Type II diabetes mellitus with renal manifestations (HCC)   CKD (chronic kidney disease), stage IIIa   HLD (hyperlipidemia)   Essential hypertension   Chronic diastolic CHF (congestive heart failure) ([place])   COVID-19 virus infection  COVID-19 pneumonia Patient is unvaccinated.  Started on Remdesivir and Decadron.  Ferritin, D-dimer trended down.  CRP trended down.  Remdesivir completed. Plan: -Prednisone taper -Hold Lasix today given rising creatinine -Reattempt ambulation test later today -If patient is able to wean from supplemental oxygen requires no more than 2 L will be able to discharge.  Anticipated date of discharge 07/02/2020  Acute respiratory failure with hypoxia Documented hypoxia to 86 on room air Oxygen status has been slowly improving Weaned to room air at rest but requires 4 L with minimal exertion due to desaturation to the 70s Plan: Continue oxygen, wean as tolerated Incentive spirometry and prone as tolerated Reattempt ambulation later today and tomorrow.  If patient is able to maintain saturations requiring no more than 2 L supplemental oxygen will be able to discharge.  Anticipated discharge within 24 hours.   Essential hypertension -Continue Coreg, ramipril, amlodipine  Depression -Continue Zoloft, Restoril  Diabetes  mellitus, type 2 -Continue Levemir and SSI -Continue Tradjenta  Hyperlipidemia -Continue Lipitor  AKI on CKD stage IIIa Holding diuresis today.  Reassess kidney function tomorrow  Chronic diastolic heart failure -Continue Lasix     DVT prophylaxis: Lovenox Code Status: Full Family Communication: None today.  Offered to call patient's family but patient declined Disposition Plan: Status is: Inpatient  Remains inpatient appropriate because:Inpatient level of care appropriate due to severity of illness   Dispo: The patient is from: Home              Anticipated d/c is to: Home              Anticipated d/c date is: 1 day              Patient currently is not medically stable to d/c. Weaned from supplemental oxygen at rest however continues to require 4 L with desaturation to the 70s on ambulation.  Holding diuresis today.  Patient net negative since admission.  Will attempt to ambulate tomorrow and assess level of desaturation.  Anticipated date of discharge 07/02/2020   Consultants:   None  Procedures:   None  Antimicrobials:   Remdesivir, completed   Subjective: Seen and examined.  Endorses cough but no shortness of breath overall improving  Objective: Vitals:   06/30/20 2105 07/01/20 0414 07/01/20 0737 07/01/20 0747  BP:  132/72 (!) 168/98   Pulse:  (!) 59 72   Resp:  16 20   Temp:  97.8 F (36.6 C) 97.9 F (36.6 C)   TempSrc:  Oral Oral   SpO2: 91% 94% 92% (!) 88%  Weight:      Height:  Intake/Output Summary (Last 24 hours) at 07/01/2020 1010 Last data filed at 06/30/2020 1700 Gross per 24 hour  Intake 480 ml  Output --  Net 480 ml   Filed Weights   06/24/20 1220  Weight: 81.6 kg    Examination:  General exam: Appears calm and comfortable  Respiratory system: Bibasilar crackles.  Normal work of breathing.  Room air at rest.  4 L with exertion  cardiovascular system: S1 & S2 heard, RRR. No JVD, murmurs, rubs, gallops or clicks. No  pedal edema. Gastrointestinal system: Abdomen is nondistended, soft and nontender. No organomegaly or masses felt. Normal bowel sounds heard. Central nervous system: Alert and oriented. No focal neurological deficits. Extremities: Symmetric 5 x 5 power. Skin: No rashes, lesions or ulcers Psychiatry: Judgement and insight appear normal. Mood & affect appropriate.     Data Reviewed: I have personally reviewed following labs and imaging studies  CBC: Recent Labs  Lab 06/25/20 0358 06/26/20 0529 06/27/20 0359 06/28/20 0427 06/29/20 0406  WBC 8.5 12.7* 11.7* 11.7* 10.6*  NEUTROABS 7.4 10.3* 9.0* 8.3* 7.5  HGB 10.7* 10.3* 10.9* 11.6* 11.1*  HCT 32.7* 30.3* 31.6* 33.5* 32.8*  MCV 85.4 84.6 82.3 82.3 83.9  PLT 291 290 314 332 762   Basic Metabolic Panel: Recent Labs  Lab 06/25/20 0358 06/25/20 0358 06/26/20 0529 06/27/20 0359 06/28/20 0427 06/29/20 0406 07/01/20 0508  NA 137  --  136 139 141 139  --   K 3.7  --  3.7 3.6 3.6 3.4*  --   CL 107  --  108 109 107 105  --   CO2 22  --  20* 23 29 25   --   GLUCOSE 234*  --  141* 105* 97 149*  --   BUN 18  --  20 24* 33* 33*  --   CREATININE 1.36*   < > 1.23 1.24 1.27* 1.18 1.42*  CALCIUM 8.1*  --  8.3* 8.4* 8.1* 7.9*  --   MG 2.3  --  2.4 2.4 2.4 2.5*  --   PHOS 2.3*  --  3.0 3.5 3.3 3.3  --    < > = values in this interval not displayed.   GFR: Estimated Creatinine Clearance: 46 mL/min (A) (by C-G formula based on SCr of 1.42 mg/dL (H)). Liver Function Tests: Recent Labs  Lab 06/25/20 0358 06/26/20 0529 06/27/20 0359 06/28/20 0427 06/29/20 0406  AST 19 14* 15 22 22   ALT 30 23 22  32 34  ALKPHOS 62 60 59 53 58  BILITOT 0.6 0.5 0.6 0.7 0.6  PROT 6.9 6.1* 6.2* 6.1* 5.9*  ALBUMIN 2.6* 2.5* 2.5* 2.5* 2.5*   No results for input(s): LIPASE, AMYLASE in the last 168 hours. No results for input(s): AMMONIA in the last 168 hours. Coagulation Profile: No results for input(s): INR, PROTIME in the last 168 hours. Cardiac  Enzymes: No results for input(s): CKTOTAL, CKMB, CKMBINDEX, TROPONINI in the last 168 hours. BNP (last 3 results) No results for input(s): PROBNP in the last 8760 hours. HbA1C: No results for input(s): HGBA1C in the last 72 hours. CBG: Recent Labs  Lab 06/30/20 2040 07/01/20 0003 07/01/20 0417 07/01/20 0747 07/01/20 0827  GLUCAP 247* 263* 154* 55* 95   Lipid Profile: No results for input(s): CHOL, HDL, LDLCALC, TRIG, CHOLHDL, LDLDIRECT in the last 72 hours. Thyroid Function Tests: No results for input(s): TSH, T4TOTAL, FREET4, T3FREE, THYROIDAB in the last 72 hours. Anemia Panel: Recent Labs    06/29/20 0406  FERRITIN  274   Sepsis Labs: Recent Labs  Lab 06/24/20 1530  PROCALCITON <0.10    Recent Results (from the past 240 hour(s))  Respiratory Panel by RT PCR (Flu A&B, Covid) - Nasopharyngeal Swab     Status: Abnormal   Collection Time: 06/24/20 12:43 PM   Specimen: Nasopharyngeal Swab  Result Value Ref Range Status   SARS Coronavirus 2 by RT PCR POSITIVE (A) NEGATIVE Final    Comment: RESULT CALLED TO, READ BACK BY AND VERIFIED WITH: STEPHANIE RUDD AT 1335 ON 06/24/2020 [place]. (NOTE) SARS-CoV-2 target nucleic acids are DETECTED.  SARS-CoV-2 RNA is generally detectable in upper respiratory specimens  during the acute phase of infection. Positive results are indicative of the presence of the identified virus, but do not rule out bacterial infection or co-infection with other pathogens not detected by the test. Clinical correlation with patient history and other diagnostic information is necessary to determine patient infection status. The expected result is Negative.  Fact Sheet for Patients:  PinkCheek.be  Fact Sheet for Healthcare Providers: GravelBags.it  This test is not yet approved or cleared by the [place] FDA and  has been authorized for detection and/or diagnosis of SARS-CoV-2 by FDA under  an Emergency Use Authorization (EUA).  This EUA will remain in effect (meaning this test c an be used) for the duration of  the COVID-19 declaration under Section 564(b)(1) of the Act, 21 U.S.C. section 360bbb-3(b)(1), unless the authorization is terminated or revoked sooner.      Influenza A by PCR NEGATIVE NEGATIVE Final   Influenza B by PCR NEGATIVE NEGATIVE Final    Comment: (NOTE) The Xpert Xpress SARS-CoV-2/FLU/RSV assay is intended as an aid in  the diagnosis of influenza from Nasopharyngeal swab specimens and  should not be used as a sole basis for treatment. Nasal washings and  aspirates are unacceptable for Xpert Xpress SARS-CoV-2/FLU/RSV  testing.  Fact Sheet for Patients: PinkCheek.be  Fact Sheet for Healthcare Providers: GravelBags.it  This test is not yet approved or cleared by the [place] FDA and  has been authorized for detection and/or diagnosis of SARS-CoV-2 by  FDA under an Emergency Use Authorization (EUA). This EUA will remain  in effect (meaning this test can be used) for the duration of the  Covid-19 declaration under Section 564(b)(1) of the Act, 21  U.S.C. section 360bbb-3(b)(1), unless the authorization is  terminated or revoked. Performed at [facility], [address] [place], [place] [address]   Culture, sputum-assessment     Status: None   Collection Time: 06/25/20  3:58 AM   Specimen: Sputum  Result Value Ref Range Status   Specimen Description SPUTUM  Final   Special Requests NONE  Final   Sputum evaluation   Final    Sputum specimen not acceptable for testing.  Please recollect.   Performed at [facility], [address] [place], [place] [address]    Report Status 06/25/2020 FINAL  Final         Radiology Studies: No results found.      Scheduled Meds:  amLODipine  10 mg Oral Daily   vitamin C  500 mg Oral Daily   atorvastatin  80 mg  Oral q1800   carvedilol  25 mg Oral BID WC   docusate sodium  100 mg Oral BID   enoxaparin (LOVENOX) injection  40 mg Subcutaneous Q24H   insulin aspart  0-15 Units Subcutaneous TID WC   insulin aspart  0-5 Units Subcutaneous QHS  insulin aspart  6 Units Subcutaneous TID WC   insulin detemir  10 Units Subcutaneous BID   Ipratropium-Albuterol  1 puff Inhalation Q6H   linagliptin  5 mg Oral Daily   methylPREDNISolone (SOLU-MEDROL) injection  60 mg Intravenous Once   ramipril  5 mg Oral BID   sertraline  50 mg Oral Daily   zinc sulfate  220 mg Oral Daily   Continuous Infusions:   LOS: 7 days    Time spent: 15 minutes    Sidney Ace, MD [facility] Pager [phone]  If 7PM-7AM, please contact night-coverage 07/01/2020, 10:10 AM

## 2020-07-01 NOTE — Progress Notes (Signed)
Inpatient Diabetes Program Recommendations  AACE/ADA: New Consensus Statement on Inpatient Glycemic Control   Target Ranges:  Prepandial:   less than 140 mg/dL      Peak postprandial:   less than 180 mg/dL (1-2 hours)      Critically ill patients:  140 - 180 mg/dL  Results for Gilbert Obrien, Gilbert Obrien (MRN [id number]) as of 07/01/2020 08:04  Ref. Range 06/30/2020 07:25 06/30/2020 11:11 06/30/2020 16:36 06/30/2020 20:40 07/01/2020 00:03 07/01/2020 04:17 07/01/2020 07:47  Glucose-Capillary Latest Ref Range: 70 - 99 mg/dL 117 (H)     Levemir 8 units 302 (H)  Novolog 11 units 229 (H)  Novolog 5 units 247 (H)  Novolog 5 units  Levemir 8 units 263 (H)  Novolog 8 units 154 (H)  Novolog 3 units 55 (L)    Review of Glycemic Control  Diabetes history: DM2 Outpatient Diabetes medications: Amaryl 4 mg daily Current orders for Inpatient glycemic control: Levemir 8 units BID, Novolog 0-15 units Q4H, Tradjenta 5 mg daily; Prednisone 40 mg QAM  Inpatient Diabetes Program Recommendations:    Insulin: Noted fasting glucose 55 mg/dl this morning which is likely due to Novolog correction Q4H during the night. Please change CBGs to AC&HS and Novolog to 0-15 units TID with meals and Novolog 0-5 units QHS.  If steroids are continued, please increase Levemir to 10 units BID and order Novolog 6 units TID with meals for meal coverage if patient eats at least 50% of meals.  Thanks, Barnie Alderman, RN, MSN, CDE Diabetes Coordinator Inpatient Diabetes Program [phone] (Team Pager from 8am to 5pm)

## 2020-07-01 NOTE — Progress Notes (Signed)
Physical Therapy Treatment Patient Details Name: Gilbert Obrien MRN: [id number] DOB: 02-Nov-1945 Today's Date: 07/01/2020    History of Present Illness Gilbert Obrien is a 73 y.o. male with a known history of coronary artery disease, stroke, diabetes, depression, hypertension, hyperlipidemia, CKD stage III. Patient presented secondary to dyspnea in setting of COVID-19 pneumonia.    PT Comments    Pt was long sitting in bed without O2 on upon arriving with sao2 94%. Was able to ambulate to doorway of room without O2 however quickly desaturates to ow 80s. Required ambulating back to EOB and applying 2L o2. While ambulating in hallway on 2 L, pt desaturates to low 80s again. He required increase to 4 L o2. Was able to ambulate 2 laps in hallway but needs several standing rest with breathing techniques to maintain > 88% sao2. After returning to bed in room, pt was able to wean back to rm air after ~ 5-8 minutes. RN aware. Pt does not require any physical assistance throughout session. Will be safe to DC home without follow up PT when medically stable.     Follow Up Recommendations  No PT follow up     Equipment Recommendations  None recommended by PT    Recommendations for Other Services       Precautions / Restrictions Precautions Precautions: None Restrictions Weight Bearing Restrictions: No    Mobility  Bed Mobility Overal bed mobility: Modified Independent                Transfers Overall transfer level: Modified independent                  Ambulation/Gait Ambulation/Gait assistance: Modified independent (Device/Increase time) Gait Distance (Feet): 300 Feet Assistive device: None   Gait velocity: decreased.   General Gait Details: several standing rest to allow sao2 to recover to >88%        Cognition Arousal/Alertness: Awake/alert Behavior During Therapy: WFL for tasks assessed/performed Overall Cognitive Status: Within Functional Limits for  tasks assessed      General Comments: Pt is A and O x 4             Pertinent Vitals/Pain Pain Assessment: No/denies pain           PT Goals (current goals can now be found in the care plan section) Acute Rehab PT Goals Patient Stated Goal: go home Progress towards PT goals: Progressing toward goals    Frequency    Min 2X/week      PT Plan Current plan remains appropriate       AM-PAC PT "6 Clicks" Mobility   Outcome Measure  Help needed turning from your back to your side while in a flat bed without using bedrails?: None Help needed moving from lying on your back to sitting on the side of a flat bed without using bedrails?: None Help needed moving to and from a bed to a chair (including a wheelchair)?: None Help needed standing up from a chair using your arms (e.g., wheelchair or bedside chair)?: None Help needed to walk in hospital room?: None Help needed climbing 3-5 steps with a railing? : None 6 Click Score: 24    End of Session Equipment Utilized During Treatment: Oxygen Activity Tolerance: Patient tolerated treatment well;Treatment limited secondary to medical complications (Comment) (limited by O2 ) Patient left: in bed;with call bell/phone within reach;with bed alarm set Nurse Communication: Mobility status PT Visit Diagnosis: Muscle weakness (generalized) (M62.81)     Time:  7711-6579 PT Time Calculation (min) (ACUTE ONLY): 24 min  Charges:  $Gait Training: 23-37 mins                     Julaine Fusi PTA 07/01/20, 2:38 PM

## 2020-07-02 LAB — BASIC METABOLIC PANEL
Anion gap: 9 (ref 5–15)
BUN: 32 mg/dL — ABNORMAL HIGH (ref 8–23)
CO2: 21 mmol/L — ABNORMAL LOW (ref 22–32)
Calcium: 8 mg/dL — ABNORMAL LOW (ref 8.9–10.3)
Chloride: 106 mmol/L (ref 98–111)
Creatinine, Ser: 1.45 mg/dL — ABNORMAL HIGH (ref 0.61–1.24)
GFR calc non Af Amer: 47 mL/min — ABNORMAL LOW (ref >60)
Glucose, Bld: 257 mg/dL — ABNORMAL HIGH (ref 70–99)
Potassium: 4 mmol/L (ref 3.5–5.1)
Sodium: 136 mmol/L (ref 135–145)

## 2020-07-02 LAB — GLUCOSE, CAPILLARY: Glucose-Capillary: 164 mg/dL — ABNORMAL HIGH (ref 70–99)

## 2020-07-02 MED ORDER — ALBUTEROL SULFATE HFA 108 (90 BASE) MCG/ACT IN AERS: 2.0000 | INHALATION_SPRAY | Freq: Four times a day (QID) | RESPIRATORY_TRACT | 1 refills | Status: DC | PRN

## 2020-07-02 MED ORDER — ASCORBIC ACID 500 MG PO TABS: 500.0000 mg | ORAL_TABLET | Freq: Every day | ORAL | 0 refills | Status: AC

## 2020-07-02 MED ORDER — PREDNISONE 50 MG PO TABS
50.0000 mg | ORAL_TABLET | Freq: Every day | ORAL | Status: DC
  Administered 2020-07-02: 09:00:00 50 mg via ORAL
  Filled 2020-07-02: qty 1

## 2020-07-02 MED ORDER — ZINC SULFATE 220 (50 ZN) MG PO CAPS: 220.0000 mg | ORAL_CAPSULE | Freq: Every day | ORAL | 0 refills | Status: AC

## 2020-07-02 MED ORDER — PREDNISONE 50 MG PO TABS: 50.0000 mg | ORAL_TABLET | Freq: Every day | ORAL | 0 refills | Status: AC

## 2020-07-02 NOTE — Care Management Important Message (Signed)
Important Message  Patient Details  Name: Gilbert Obrien MRN: [id number] Date of Birth: Sep 25, 1946   Medicare Important Message Given:  Yes  Reviewed with patient via room phone due to isolation status. Declined copy of Medicare IM.     Dannette Barbara 07/02/2020, 10:21 AM

## 2020-07-02 NOTE — TOC Transition Note (Signed)
Transition of Care [facility]) - CM/SW Discharge Note   Patient Details  Name: Gilbert Obrien MRN: [id number] Date of Birth: 14-Apr-1946  Transition of Care [facility]) CM/SW Contact:  Shelbie Hutching, RN Phone Number: 07/02/2020, 9:24 AM   Clinical Narrative:    Patient is medically cleared for discharge home with home health services.  Patient does not have a preference in home health agency, Jana Half with [facility] has accepted the referral for [facility] RN.  Patient will be discharging on home O2.  [facility] given referral and oxygen will be delivered to the room before discharge.  Patient instructed to purchase a pulse oximeter so he can monitor his oxygen saturations at home.   Patient reports that his daughter can pick him up at discharge.  RNCM left a message with daughter for return call.    Final next level of care: [place] Barriers to Discharge: Barriers Resolved   Patient Goals and CMS Choice Patient states their goals for this hospitalization and ongoing recovery are:: Glad to be going home today CMS Medicare.gov Compare Post Acute Care list provided to:: Patient Choice offered to / list presented to : Patient  Discharge Placement                       Discharge Plan and Services   Discharge Planning Services: CM Consult Post Acute Care Choice: Home Health          DME Arranged: Oxygen DME Agency: [facility] Date DME Agency Contacted: 07/02/20 Time DME Agency Contacted: [phone] Representative spoke with at DME Agency: [place]: RN [place] Agency: [facility] Date [place]: 07/02/20 Time [place]: [phone] Representative spoke with at [place]: [place] ([place]) Interventions     Readmission Risk Interventions Readmission Risk Prevention Plan 06/28/2020  Transportation Screening Complete  PCP or Specialist Appt within 3-5 Days Complete  HRI or [place] Patient refused   Social Work Consult for [place] Planning/Counseling Complete  Palliative Care Screening Not Applicable  Medication Review Press photographer) Complete  Some recent data might be hidden

## 2020-07-02 NOTE — Discharge Summary (Signed)
Physician Discharge Summary  Gilbert Obrien URK:[id number] DOB: 10/02/1945 DOA: 06/24/2020  PCP: [name], MD  Admit date: 06/24/2020 Discharge date: 07/02/2020  Admitted From: Home Disposition: Home with home health services  Recommendations for Outpatient Follow-up:  1. Follow up with PCP in 1-2 weeks   Home Health: Yes Equipment/Devices: Yes oxygen 3 L  Discharge Condition: Stable CODE STATUS: Full Diet recommendation: Heart Healthy / Carb Modified  Brief/Interim Summary:  Gilbert Obrien a 74 y.o.malewith a known history of coronary artery disease, stroke, diabetes, depression, hypertension, hyperlipidemia, CKD stage III. Patient presented secondary to dyspnea in setting of COVID-19 pneumonia.  Has been steadily improving over interval.  Was able to wean from oxygen on room air but continues to require 4 L after desaturation in the 70s with ambulation.    Oxygen status continue to improve over the course the next 24 hours.  Patient was ambulated on the day of discharge and found to desaturate requiring 2 to 3 L supplemental oxygen.  Cleared for discharge at this time.  Will require 3 L of oxygen continuously on discharge.  Home oxygen and home health nursing ordered.  Covid safety instructions provided in discharge packet.  All questions answered.  Patient stable for discharge home.  Discharge Diagnoses:  Principal Problem:   Acute respiratory failure with hypoxia (HCC) Active Problems:   Type II diabetes mellitus with renal manifestations (HCC)   CKD (chronic kidney disease), stage IIIa   HLD (hyperlipidemia)   Essential hypertension   Chronic diastolic CHF (congestive heart failure) ([place])   COVID-19 virus infection   COVID-19 pneumonia Patient is unvaccinated.  Started on Remdesivir and Decadron.  Ferritin, D-dimer trendeddown.  CRP trended down.  Remdesivir completed. Weaned to room air at rest.  Requires 3 L on exertion Home oxygen ordered Home  prednisone ordered to complete 10-day course  Acute respiratory failure with hypoxia Documented hypoxia to 86 on room air Oxygen status has been slowly improving Weaned to room air at rest but requires 4 L with minimal exertion due to desaturation to the 70s.  This gradually improved and patient was ambulated on the day of discharge.  Found to require 3L.  Home oxygen ordered.  Stable for discharge.   Essential hypertension Continue Coreg, ramipril, amlodipine as per home doses on discharge  Depression -Continue Zoloft, Restoril  Diabetes mellitus, type 2 Can resume home regimen on discharge  Hyperlipidemia -Continue Lipitor  AKI on CKD stage IIIa Resume home diuretic schedule  Chronic diastolic heart failure -Continue Lasix per home dose  Discharge Instructions  Discharge Instructions    Diet - low sodium heart healthy   Complete by: As directed    Increase activity slowly   Complete by: As directed      Allergies as of 07/02/2020      Reactions   Celecoxib Anaphylaxis   Swelling in throat.   Sulfa Antibiotics Nausea And Vomiting, Swelling      Medication List    TAKE these medications   acetaminophen 500 MG tablet Commonly known as: TYLENOL Take 2 tablets (1,000 mg total) by mouth every 6 (six) hours as needed for mild pain.   albuterol 108 (90 Base) MCG/ACT inhaler Commonly known as: VENTOLIN HFA Inhale 2 puffs into the lungs every 6 (six) hours as needed for wheezing or shortness of breath.   amLODipine 10 MG tablet Commonly known as: NORVASC TAKE 1 TABLET EVERY DAY   ascorbic acid 500 MG tablet Commonly known as: VITAMIN C  Take 1 tablet (500 mg total) by mouth daily.   aspirin EC 81 MG tablet Take 1 tablet (81 mg total) by mouth daily.   atorvastatin 80 MG tablet Commonly known as: LIPITOR Take 1 tablet (80 mg total) by mouth daily at 6 PM.   carvedilol 25 MG tablet Commonly known as: COREG Take 1 tablet (25 mg total) by mouth in the  morning and at bedtime.   furosemide 20 MG tablet Commonly known as: LASIX TAKE 1 TABLET EVERY DAY What changed: when to take this   glimepiride 2 MG tablet Commonly known as: AMARYL Take 4 mg by mouth daily with breakfast.   predniSONE 50 MG tablet Commonly known as: DELTASONE Take 1 tablet (50 mg total) by mouth daily with breakfast for 3 days.   Procto-Med HC 2.5 % rectal cream Generic drug: hydrocortisone Place 1 application rectally 3 (three) times daily as needed (discomfort/hemorrhoids.).   ramipril 5 MG capsule Commonly known as: ALTACE TAKE 1 CAPSULE TWICE DAILY What changed: when to take this   sertraline 50 MG tablet Commonly known as: ZOLOFT Take 50 mg by mouth daily.   temazepam 15 MG capsule Commonly known as: RESTORIL Take 15 mg by mouth at bedtime as needed for sleep.   zinc sulfate 220 (50 Zn) MG capsule Take 1 capsule (220 mg total) by mouth daily.            Durable Medical Equipment  (From admission, onward)         Start     Ordered   07/02/20 [phone]  For home use only DME oxygen  Once       Question Answer Comment  Length of Need 6 Months   Mode or (Route) Nasal cannula   Liters per Minute 3   Frequency Continuous (stationary and portable oxygen unit needed)   Oxygen conserving device Yes   Oxygen delivery system Gas      07/02/20 0832          Follow-up Information    [name], MD. Go on 07/08/2020.   Specialty: Internal Medicine Why: @10 :15 AM Contact information: [address] [place] [address], [place], [place]. Go on 07/06/2020.   Specialty: Cardiology Why: @2 :15 PM  Contact information: [address] [place] [address] [phone]              Allergies  Allergen Reactions  . Celecoxib Anaphylaxis    Swelling in throat.  . Sulfa Antibiotics Nausea And Vomiting and Swelling     Consultations:  None   Procedures/Studies: DG Chest 2 View  Result Date: 06/24/2020 CLINICAL DATA:  Shortness of breath EXAM: CHEST - 2 VIEW COMPARISON:  March 24, 2020 FINDINGS: There is airspace opacity throughout both mid and lower lung regions and in the periphery of each upper lobe region. Heart size and pulmonary vascularity are normal. No adenopathy. Metallic foreign bodies noted on the right anteriorly are stable. No bone lesions. IMPRESSION: Airspace opacity throughout both lungs, primarily in the mid and lower lung regions. Suspect multifocal pneumonia, likely of atypical organism etiology. Correlation with COVID-19 status advised. Heart size normal.  No adenopathy evident. Electronically Signed   By: Lowella Grip III M.D.   On: 06/24/2020 13:08    (Echo, Carotid, EGD, Colonoscopy, ERCP)    Subjective: Seen and examined on day of discharge.  Stable, no distress.  Room air at  rest with desaturation and oxygen need on ambulation and exertion.  Home oxygen ordered.  Stable for discharge home.  Discharge Exam: Vitals:   07/02/20 0753 07/02/20 1011  BP: (!) 158/79 (!) 149/74  Pulse: 67 63  Resp: 18 17  Temp: 97.8 F (36.6 C)   SpO2: 100% 94%   Vitals:   07/02/20 0005 07/02/20 0516 07/02/20 0753 07/02/20 1011  BP: 123/79 (!) 151/73 (!) 158/79 (!) 149/74  Pulse: 69 60 67 63  Resp: 16 16 18 17   Temp: 98.6 F (37 C) 98 F (36.7 C) 97.8 F (36.6 C)   TempSrc: Oral Oral Oral   SpO2: 92% 93% 100% 94%  Weight:      Height:        General: Pt is alert, awake, not in acute distress Cardiovascular: RRR, S1/S2 +, no rubs, no gallops Respiratory: Bibasilar crackles.  Normal work of breathing.  Room air at rest. Abdominal: Soft, NT, ND, bowel sounds + Extremities: no edema, no cyanosis    The results of significant diagnostics from this hospitalization (including imaging, microbiology, ancillary and laboratory) are listed below for reference.      Microbiology: Recent Results (from the past 240 hour(s))  Respiratory Panel by RT PCR (Flu A&B, Covid) - Nasopharyngeal Swab     Status: Abnormal   Collection Time: 06/24/20 12:43 PM   Specimen: Nasopharyngeal Swab  Result Value Ref Range Status   SARS Coronavirus 2 by RT PCR POSITIVE (A) NEGATIVE Final    Comment: RESULT CALLED TO, READ BACK BY AND VERIFIED WITH: STEPHANIE RUDD AT 1335 ON 06/24/2020 [place]. (NOTE) SARS-CoV-2 target nucleic acids are DETECTED.  SARS-CoV-2 RNA is generally detectable in upper respiratory specimens  during the acute phase of infection. Positive results are indicative of the presence of the identified virus, but do not rule out bacterial infection or co-infection with other pathogens not detected by the test. Clinical correlation with patient history and other diagnostic information is necessary to determine patient infection status. The expected result is Negative.  Fact Sheet for Patients:  PinkCheek.be  Fact Sheet for Healthcare Providers: GravelBags.it  This test is not yet approved or cleared by the [place] FDA and  has been authorized for detection and/or diagnosis of SARS-CoV-2 by FDA under an Emergency Use Authorization (EUA).  This EUA will remain in effect (meaning this test c an be used) for the duration of  the COVID-19 declaration under Section 564(b)(1) of the Act, 21 U.S.C. section 360bbb-3(b)(1), unless the authorization is terminated or revoked sooner.      Influenza A by PCR NEGATIVE NEGATIVE Final   Influenza B by PCR NEGATIVE NEGATIVE Final    Comment: (NOTE) The Xpert Xpress SARS-CoV-2/FLU/RSV assay is intended as an aid in  the diagnosis of influenza from Nasopharyngeal swab specimens and  should not be used as a sole basis for treatment. Nasal washings and  aspirates are unacceptable for Xpert Xpress SARS-CoV-2/FLU/RSV  testing.  Fact Sheet for  Patients: PinkCheek.be  Fact Sheet for Healthcare Providers: GravelBags.it  This test is not yet approved or cleared by the [place] FDA and  has been authorized for detection and/or diagnosis of SARS-CoV-2 by  FDA under an Emergency Use Authorization (EUA). This EUA will remain  in effect (meaning this test can be used) for the duration of the  Covid-19 declaration under Section 564(b)(1) of the Act, 21  U.S.C. section 360bbb-3(b)(1), unless the authorization is  terminated or revoked. Performed at [facility], [phone] [place], [place] [address]   Culture, sputum-assessment     Status: None   Collection Time: 06/25/20  3:58 AM   Specimen: Sputum  Result Value Ref Range Status   Specimen Description SPUTUM  Final   Special Requests NONE  Final   Sputum evaluation   Final    Sputum specimen not acceptable for testing.  Please recollect.   Performed at [facility], [address] [place], [place] [address]    Report Status 06/25/2020 FINAL  Final     Labs: BNP (last 3 results) Recent Labs    03/26/20 0520  BNP 30.1   Basic Metabolic Panel: Recent Labs  Lab 06/26/20 0529 06/26/20 0529 06/27/20 0359 06/28/20 0427 06/29/20 0406 07/01/20 0508 07/02/20 0426  NA 136  --  139 141 139  --  136  K 3.7  --  3.6 3.6 3.4*  --  4.0  CL 108  --  109 107 105  --  106  CO2 20*  --  23 29 25   --  21*  GLUCOSE 141*  --  105* 97 149*  --  257*  BUN 20  --  24* 33* 33*  --  32*  CREATININE 1.23   < > 1.24 1.27* 1.18 1.42* 1.45*  CALCIUM 8.3*  --  8.4* 8.1* 7.9*  --  8.0*  MG 2.4  --  2.4 2.4 2.5*  --   --   PHOS 3.0  --  3.5 3.3 3.3  --   --    < > = values in this interval not displayed.   Liver Function Tests: Recent Labs  Lab 06/26/20 0529 06/27/20 0359 06/28/20 0427 06/29/20 0406  AST 14* 15 22 22   ALT 23 22 32 34  ALKPHOS 60 59 53 58  BILITOT 0.5 0.6 0.7 0.6  PROT 6.1* 6.2*  6.1* 5.9*  ALBUMIN 2.5* 2.5* 2.5* 2.5*   No results for input(s): LIPASE, AMYLASE in the last 168 hours. No results for input(s): AMMONIA in the last 168 hours. CBC: Recent Labs  Lab 06/26/20 0529 06/27/20 0359 06/28/20 0427 06/29/20 0406  WBC 12.7* 11.7* 11.7* 10.6*  NEUTROABS 10.3* 9.0* 8.3* 7.5  HGB 10.3* 10.9* 11.6* 11.1*  HCT 30.3* 31.6* 33.5* 32.8*  MCV 84.6 82.3 82.3 83.9  PLT 290 314 332 356   Cardiac Enzymes: No results for input(s): CKTOTAL, CKMB, CKMBINDEX, TROPONINI in the last 168 hours. BNP: Invalid input(s): POCBNP CBG: Recent Labs  Lab 07/01/20 0827 07/01/20 1138 07/01/20 1650 07/01/20 2147 07/02/20 0752  GLUCAP 95 229* 251* 284* 164*   D-Dimer No results for input(s): DDIMER in the last 72 hours. Hgb A1c Recent Labs    07/01/20 0508  HGBA1C 7.8*   Lipid Profile No results for input(s): CHOL, HDL, LDLCALC, TRIG, CHOLHDL, LDLDIRECT in the last 72 hours. Thyroid function studies No results for input(s): TSH, T4TOTAL, T3FREE, THYROIDAB in the last 72 hours.  Invalid input(s): FREET3 Anemia work up No results for input(s): VITAMINB12, FOLATE, FERRITIN, TIBC, IRON, RETICCTPCT in the last 72 hours. Urinalysis    Component Value Date/Time   COLORURINE YELLOW 10/03/2016 [place] 10/03/2016 1635   LABSPEC 1.006 10/03/2016 1635   PHURINE 6.0 10/03/2016 1635   GLUCOSEU NEGATIVE 10/03/2016 1635   HGBUR NEGATIVE 10/03/2016 1635   BILIRUBINUR NEGATIVE 10/03/2016 [place] 10/03/2016 1635   PROTEINUR NEGATIVE 10/03/2016 1635   NITRITE NEGATIVE 10/03/2016 1635   LEUKOCYTESUR NEGATIVE 10/03/2016 1635  Sepsis Labs Invalid input(s): PROCALCITONIN,  WBC,  LACTICIDVEN Microbiology Recent Results (from the past 240 hour(s))  Respiratory Panel by RT PCR (Flu A&B, Covid) - Nasopharyngeal Swab     Status: Abnormal   Collection Time: 06/24/20 12:43 PM   Specimen: Nasopharyngeal Swab  Result Value Ref Range Status   SARS  Coronavirus 2 by RT PCR POSITIVE (A) NEGATIVE Final    Comment: RESULT CALLED TO, READ BACK BY AND VERIFIED WITH: STEPHANIE RUDD AT 9150 ON 06/24/2020 [place]. (NOTE) SARS-CoV-2 target nucleic acids are DETECTED.  SARS-CoV-2 RNA is generally detectable in upper respiratory specimens  during the acute phase of infection. Positive results are indicative of the presence of the identified virus, but do not rule out bacterial infection or co-infection with other pathogens not detected by the test. Clinical correlation with patient history and other diagnostic information is necessary to determine patient infection status. The expected result is Negative.  Fact Sheet for Patients:  PinkCheek.be  Fact Sheet for Healthcare Providers: GravelBags.it  This test is not yet approved or cleared by the [place] FDA and  has been authorized for detection and/or diagnosis of SARS-CoV-2 by FDA under an Emergency Use Authorization (EUA).  This EUA will remain in effect (meaning this test c an be used) for the duration of  the COVID-19 declaration under Section 564(b)(1) of the Act, 21 U.S.C. section 360bbb-3(b)(1), unless the authorization is terminated or revoked sooner.      Influenza A by PCR NEGATIVE NEGATIVE Final   Influenza B by PCR NEGATIVE NEGATIVE Final    Comment: (NOTE) The Xpert Xpress SARS-CoV-2/FLU/RSV assay is intended as an aid in  the diagnosis of influenza from Nasopharyngeal swab specimens and  should not be used as a sole basis for treatment. Nasal washings and  aspirates are unacceptable for Xpert Xpress SARS-CoV-2/FLU/RSV  testing.  Fact Sheet for Patients: PinkCheek.be  Fact Sheet for Healthcare Providers: GravelBags.it  This test is not yet approved or cleared by the [place] FDA and  has been authorized for detection and/or diagnosis of SARS-CoV-2  by  FDA under an Emergency Use Authorization (EUA). This EUA will remain  in effect (meaning this test can be used) for the duration of the  Covid-19 declaration under Section 564(b)(1) of the Act, 21  U.S.C. section 360bbb-3(b)(1), unless the authorization is  terminated or revoked. Performed at [facility], [address] [place], [place] [address]   Culture, sputum-assessment     Status: None   Collection Time: 06/25/20  3:58 AM   Specimen: Sputum  Result Value Ref Range Status   Specimen Description SPUTUM  Final   Special Requests NONE  Final   Sputum evaluation   Final    Sputum specimen not acceptable for testing.  Please recollect.   Performed at [facility], [address] [place], [place] [address]    Report Status 06/25/2020 FINAL  Final     Time coordinating discharge: Over 30 minutes  SIGNED:   Sidney Ace, MD  [facility] 07/02/2020, 10:23 AM Pager   If 7PM-7AM, please contact night-coverage

## 2020-07-02 NOTE — Discharge Instructions (Signed)
Home Health Nursing has been arranged with [facility].  They will call you to set up a time to come out once you get home.  You are being discharged on oxygen, please go to the pharmacy or order online a pulse oximeter so you can monitor your oxygen saturations at home.   [employer] will provide your oxygen.    10 Things You Can Do to Manage Your COVID-19 Symptoms at Home If you have possible or confirmed COVID-19: 1. Stay home from work and school. And stay away from other public places. If you must go out, avoid using any kind of public transportation, ridesharing, or taxis. 2. Monitor your symptoms carefully. If your symptoms get worse, call your healthcare provider immediately. 3. Get rest and stay hydrated. 4. If you have a medical appointment, call the healthcare provider ahead of time and tell them that you have or may have COVID-19. 5. For medical emergencies, call 911 and notify the dispatch personnel that you have or may have COVID-19. 6. Cover your cough and sneezes with a tissue or use the inside of your elbow. 7. Wash your hands often with soap and water for at least 20 seconds or clean your hands with an alcohol-based hand sanitizer that contains at least 60% alcohol. 8. As much as possible, stay in a specific room and away from other people in your home. Also, you should use a separate bathroom, if available. If you need to be around other people in or outside of the home, wear a mask. 9. Avoid sharing personal items with other people in your household, like dishes, towels, and bedding. 10. Clean all surfaces that are touched often, like counters, tabletops, and doorknobs. Use household cleaning sprays or wipes according to the label instructions. [email or web address] 03/26/2019 This information is not intended to replace advice given to you by your health care provider. Make sure you discuss any questions you have with your health care provider. Document Revised: 08/28/2019 Document  Reviewed: 08/28/2019 Elsevier Patient Education  2020 [place].   COVID-19 COVID-19 is a respiratory infection that is caused by a virus called severe acute respiratory syndrome coronavirus 2 (SARS-CoV-2). The disease is also known as coronavirus disease or novel coronavirus. In some people, the virus may not cause any symptoms. In others, it may cause a serious infection. The infection can get worse quickly and can lead to complications, such as:  Pneumonia, or infection of the lungs.  Acute respiratory distress syndrome or ARDS. This is a condition in which fluid build-up in the lungs prevents the lungs from filling with air and passing oxygen into the blood.  Acute respiratory failure. This is a condition in which there is not enough oxygen passing from the lungs to the body or when carbon dioxide is not passing from the lungs out of the body.  Sepsis or septic shock. This is a serious bodily reaction to an infection.  Blood clotting problems.  Secondary infections due to bacteria or fungus.  Organ failure. This is when your body's organs stop working. The virus that causes COVID-19 is contagious. This means that it can spread from person to person through droplets from coughs and sneezes (respiratory secretions). What are the causes? This illness is caused by a virus. You may catch the virus by:  Breathing in droplets from an infected person. Droplets can be spread by a person breathing, speaking, singing, coughing, or sneezing.  Touching something, like a table or a doorknob, that was exposed to the virus (  contaminated) and then touching your mouth, nose, or eyes. What increases the risk? Risk for infection You are more likely to be infected with this virus if you:  Are within 6 feet (2 meters) of a person with COVID-19.  Provide care for or live with a person who is infected with COVID-19.  Spend time in crowded indoor spaces or live in shared housing. Risk for serious  illness You are more likely to become seriously ill from the virus if you:  Are 32 years of age or older. The higher your age, the more you are at risk for serious illness.  Live in a nursing home or long-term care facility.  Have cancer.  Have a long-term (chronic) disease such as: ? Chronic lung disease, including chronic obstructive pulmonary disease or asthma. ? A long-term disease that lowers your body's ability to fight infection (immunocompromised). ? Heart disease, including heart failure, a condition in which the arteries that lead to the heart become narrow or blocked (coronary artery disease), a disease which makes the heart muscle thick, weak, or stiff (cardiomyopathy). ? Diabetes. ? Chronic kidney disease. ? Sickle cell disease, a condition in which red blood cells have an abnormal "sickle" shape. ? Liver disease.  Are obese. What are the signs or symptoms? Symptoms of this condition can range from mild to severe. Symptoms may appear any time from 2 to 14 days after being exposed to the virus. They include:  A fever or chills.  A cough.  Difficulty breathing.  Headaches, body aches, or muscle aches.  Runny or stuffy (congested) nose.  A sore throat.  New loss of taste or smell. Some people may also have stomach problems, such as nausea, vomiting, or diarrhea. Other people may not have any symptoms of COVID-19. How is this diagnosed? This condition may be diagnosed based on:  Your signs and symptoms, especially if: ? You live in an area with a COVID-19 outbreak. ? You recently traveled to or from an area where the virus is common. ? You provide care for or live with a person who was diagnosed with COVID-19. ? You were exposed to a person who was diagnosed with COVID-19.  A physical exam.  Lab tests, which may include: ? Taking a sample of fluid from the back of your nose and throat (nasopharyngeal fluid), your nose, or your throat using a swab. ? A sample  of mucus from your lungs (sputum). ? Blood tests.  Imaging tests, which may include, X-rays, CT scan, or ultrasound. How is this treated? At present, there is no medicine to treat COVID-19. Medicines that treat other diseases are being used on a trial basis to see if they are effective against COVID-19. Your health care provider will talk with you about ways to treat your symptoms. For most people, the infection is mild and can be managed at home with rest, fluids, and over-the-counter medicines. Treatment for a serious infection usually takes places in a hospital intensive care unit (ICU). It may include one or more of the following treatments. These treatments are given until your symptoms improve.  Receiving fluids and medicines through an IV.  Supplemental oxygen. Extra oxygen is given through a tube in the nose, a face mask, or a hood.  Positioning you to lie on your stomach (prone position). This makes it easier for oxygen to get into the lungs.  Continuous positive airway pressure (CPAP) or bi-level positive airway pressure (BPAP) machine. This treatment uses mild air pressure to keep  the airways open. A tube that is connected to a motor delivers oxygen to the body.  Ventilator. This treatment moves air into and out of the lungs by using a tube that is placed in your windpipe.  Tracheostomy. This is a procedure to create a hole in the neck so that a breathing tube can be inserted.  Extracorporeal membrane oxygenation (ECMO). This procedure gives the lungs a chance to recover by taking over the functions of the heart and lungs. It supplies oxygen to the body and removes carbon dioxide. Follow these instructions at home: Lifestyle  If you are sick, stay home except to get medical care. Your health care provider will tell you how long to stay home. Call your health care provider before you go for medical care.  Rest at home as told by your health care provider.  Do not use any  products that contain nicotine or tobacco, such as cigarettes, e-cigarettes, and chewing tobacco. If you need help quitting, ask your health care provider.  Return to your normal activities as told by your health care provider. Ask your health care provider what activities are safe for you. General instructions  Take over-the-counter and prescription medicines only as told by your health care provider.  Drink enough fluid to keep your urine pale yellow.  Keep all follow-up visits as told by your health care provider. This is important. How is this prevented?  There is no vaccine to help prevent COVID-19 infection. However, there are steps you can take to protect yourself and others from this virus. To protect yourself:   Do not travel to areas where COVID-19 is a risk. The areas where COVID-19 is reported change often. To identify high-risk areas and travel restrictions, check the CDC travel website: FatFares.com.br  If you live in, or must travel to, an area where COVID-19 is a risk, take precautions to avoid infection. ? Stay away from people who are sick. ? Wash your hands often with soap and water for 20 seconds. If soap and water are not available, use an alcohol-based hand sanitizer. ? Avoid touching your mouth, face, eyes, or nose. ? Avoid going out in public, follow guidance from your state and local health authorities. ? If you must go out in public, wear a cloth face covering or face mask. Make sure your mask covers your nose and mouth. ? Avoid crowded indoor spaces. Stay at least 6 feet (2 meters) away from others. ? Disinfect objects and surfaces that are frequently touched every day. This may include:  Counters and tables.  Doorknobs and light switches.  Sinks and faucets.  Electronics, such as phones, remote controls, keyboards, computers, and tablets. To protect others: If you have symptoms of COVID-19, take steps to prevent the virus from spreading to  others.  If you think you have a COVID-19 infection, contact your health care provider right away. Tell your health care team that you think you may have a COVID-19 infection.  Stay home. Leave your house only to seek medical care. Do not use public transport.  Do not travel while you are sick.  Wash your hands often with soap and water for 20 seconds. If soap and water are not available, use alcohol-based hand sanitizer.  Stay away from other members of your household. Let healthy household members care for children and pets, if possible. If you have to care for children or pets, wash your hands often and wear a mask. If possible, stay in your own room,  separate from others. Use a different bathroom.  Make sure that all people in your household wash their hands well and often.  Cough or sneeze into a tissue or your sleeve or elbow. Do not cough or sneeze into your hand or into the air.  Wear a cloth face covering or face mask. Make sure your mask covers your nose and mouth. Where to find more information  Centers for Disease Control and Prevention: PurpleGadgets.be  World Health Organization: https://www.castaneda.info/ Contact a health care provider if:  You live in or have traveled to an area where COVID-19 is a risk and you have symptoms of the infection.  You have had contact with someone who has COVID-19 and you have symptoms of the infection. Get help right away if:  You have trouble breathing.  You have pain or pressure in your chest.  You have confusion.  You have bluish lips and fingernails.  You have difficulty waking from sleep.  You have symptoms that get worse. These symptoms may represent a serious problem that is an emergency. Do not wait to see if the symptoms will go away. Get medical help right away. Call your local emergency services (911 in the [place] Do not drive yourself to the hospital. Let the emergency medical  personnel know if you think you have COVID-19. Summary  COVID-19 is a respiratory infection that is caused by a virus. It is also known as coronavirus disease or novel coronavirus. It can cause serious infections, such as pneumonia, acute respiratory distress syndrome, acute respiratory failure, or sepsis.  The virus that causes COVID-19 is contagious. This means that it can spread from person to person through droplets from breathing, speaking, singing, coughing, or sneezing.  You are more likely to develop a serious illness if you are 20 years of age or older, have a weak immune system, live in a nursing home, or have chronic disease.  There is no medicine to treat COVID-19. Your health care provider will talk with you about ways to treat your symptoms.  Take steps to protect yourself and others from infection. Wash your hands often and disinfect objects and surfaces that are frequently touched every day. Stay away from people who are sick and wear a mask if you are sick. This information is not intended to replace advice given to you by your health care provider. Make sure you discuss any questions you have with your health care provider. Document Revised: 07/11/2019 Document Reviewed: 10/17/2018 Elsevier Patient Education  2020 [place].  COVID-19: How to Protect Yourself and Others Know how it spreads  There is currently no vaccine to prevent coronavirus disease 2019 (COVID-19).  The best way to prevent illness is to avoid being exposed to this virus.  The virus is thought to spread mainly from person-to-person. ? Between people who are in close contact with one another (within about 6 feet). ? Through respiratory droplets produced when an infected person coughs, sneezes or talks. ? These droplets can land in the mouths or noses of people who are nearby or possibly be inhaled into the lungs. ? COVID-19 may be spread by people who are not showing symptoms. Everyone should Clean  your hands often  Wash your hands often with soap and water for at least 20 seconds especially after you have been in a public place, or after blowing your nose, coughing, or sneezing.  If soap and water are not readily available, use a hand sanitizer that contains at least 60% alcohol. Cover  all surfaces of your hands and rub them together until they feel dry.  Avoid touching your eyes, nose, and mouth with unwashed hands. Avoid close contact  Limit contact with others as much as possible.  Avoid close contact with people who are sick.  Put distance between yourself and other people. ? Remember that some people without symptoms may be able to spread virus. ? This is especially important for people who are at higher risk of getting very GainPain.com.cy Cover your mouth and nose with a mask when around others  You could spread COVID-19 to others even if you do not feel sick.  Everyone should wear a mask in public settings and when around people not living in their household, especially when social distancing is difficult to maintain. ? Masks should not be placed on young children under age 63, anyone who has trouble breathing, or is unconscious, incapacitated or otherwise unable to remove the mask without assistance.  The mask is meant to protect other people in case you are infected.  Do NOT use a facemask meant for a Dietitian.  Continue to keep about 6 feet between yourself and others. The mask is not a substitute for social distancing. Cover coughs and sneezes  Always cover your mouth and nose with a tissue when you cough or sneeze or use the inside of your elbow.  Throw used tissues in the trash.  Immediately wash your hands with soap and water for at least 20 seconds. If soap and water are not readily available, clean your hands with a hand sanitizer that contains at least 60% alcohol. Clean and  disinfect  Clean AND disinfect frequently touched surfaces daily. This includes tables, doorknobs, light switches, countertops, handles, desks, phones, keyboards, toilets, faucets, and sinks. [email or web address]  If surfaces are dirty, clean them: Use detergent or soap and water prior to disinfection.  Then, use a household disinfectant. You can see a list of EPA-registered household disinfectants here. [email or web address] 05/28/2019 This information is not intended to replace advice given to you by your health care provider. Make sure you discuss any questions you have with your health care provider. Document Revised: 06/05/2019 Document Reviewed: 04/03/2019 Elsevier Patient Education  [place]: Quarantine vs. Isolation QUARANTINE keeps someone who was in close contact with someone who has COVID-19 away from others. If you had close contact with a person who has COVID-19  Stay home until 14 days after your last contact.  Check your temperature twice a day and watch for symptoms of COVID-19.  If possible, stay away from people who are at higher-risk for getting very sick from COVID-19. ISOLATION keeps someone who is sick or tested positive for COVID-19 without symptoms away from others, even in their own home. If you are sick and think or know you have COVID-19  Stay home until after ? At least 10 days since symptoms first appeared and ? At least 24 hours with no fever without fever-reducing medication and ? Symptoms have improved If you tested positive for COVID-19 but do not have symptoms  Stay home until after ? 10 days have passed since your positive test If you live with others, stay in a specific "sick room" or area and away from other people or animals, including pets. Use a separate bathroom, if available. [email or web address] 04/14/2019 This information is not intended to replace advice  given to you by your health care provider. Make sure you discuss any questions you have with your health care provider.  Document Revised: 08/28/2019 Document Reviewed: 08/28/2019 Elsevier Patient Education  [place].

## 2020-07-02 NOTE — Progress Notes (Signed)
Received MD order to discharge patient to home, with home o2, reviewed discharge instructions, homes meds, prescriptions and follow up appointments with patient and patient verbalized understanding

## 2020-07-06 ENCOUNTER — Ambulatory Visit: Payer: Medicare HMO | Admitting: Physician Assistant

## 2020-07-08 DIAGNOSIS — I5032 Chronic diastolic (congestive) heart failure: Secondary | ICD-10-CM | POA: Diagnosis not present

## 2020-07-08 DIAGNOSIS — E1122 Type 2 diabetes mellitus with diabetic chronic kidney disease: Secondary | ICD-10-CM | POA: Diagnosis not present

## 2020-07-08 DIAGNOSIS — U071 COVID-19: Secondary | ICD-10-CM | POA: Diagnosis not present

## 2020-07-08 DIAGNOSIS — J9601 Acute respiratory failure with hypoxia: Secondary | ICD-10-CM | POA: Diagnosis not present

## 2020-07-08 DIAGNOSIS — F329 Major depressive disorder, single episode, unspecified: Secondary | ICD-10-CM | POA: Diagnosis not present

## 2020-07-08 DIAGNOSIS — I255 Ischemic cardiomyopathy: Secondary | ICD-10-CM | POA: Diagnosis not present

## 2020-07-08 DIAGNOSIS — N1831 Chronic kidney disease, stage 3a: Secondary | ICD-10-CM | POA: Diagnosis not present

## 2020-07-08 DIAGNOSIS — I2510 Atherosclerotic heart disease of native coronary artery without angina pectoris: Secondary | ICD-10-CM | POA: Diagnosis not present

## 2020-07-08 DIAGNOSIS — I130 Hypertensive heart and chronic kidney disease with heart failure and stage 1 through stage 4 chronic kidney disease, or unspecified chronic kidney disease: Secondary | ICD-10-CM | POA: Diagnosis not present

## 2020-07-15 DIAGNOSIS — N1831 Chronic kidney disease, stage 3a: Secondary | ICD-10-CM | POA: Diagnosis not present

## 2020-07-15 DIAGNOSIS — J9601 Acute respiratory failure with hypoxia: Secondary | ICD-10-CM | POA: Diagnosis not present

## 2020-07-15 DIAGNOSIS — I130 Hypertensive heart and chronic kidney disease with heart failure and stage 1 through stage 4 chronic kidney disease, or unspecified chronic kidney disease: Secondary | ICD-10-CM | POA: Diagnosis not present

## 2020-07-15 DIAGNOSIS — I2510 Atherosclerotic heart disease of native coronary artery without angina pectoris: Secondary | ICD-10-CM | POA: Diagnosis not present

## 2020-07-15 DIAGNOSIS — U071 COVID-19: Secondary | ICD-10-CM | POA: Diagnosis not present

## 2020-07-15 DIAGNOSIS — F329 Major depressive disorder, single episode, unspecified: Secondary | ICD-10-CM | POA: Diagnosis not present

## 2020-07-15 DIAGNOSIS — I5032 Chronic diastolic (congestive) heart failure: Secondary | ICD-10-CM | POA: Diagnosis not present

## 2020-07-15 DIAGNOSIS — E1122 Type 2 diabetes mellitus with diabetic chronic kidney disease: Secondary | ICD-10-CM | POA: Diagnosis not present

## 2020-07-15 DIAGNOSIS — I255 Ischemic cardiomyopathy: Secondary | ICD-10-CM | POA: Diagnosis not present

## 2020-07-16 DIAGNOSIS — I5032 Chronic diastolic (congestive) heart failure: Secondary | ICD-10-CM | POA: Diagnosis not present

## 2020-07-16 DIAGNOSIS — I2510 Atherosclerotic heart disease of native coronary artery without angina pectoris: Secondary | ICD-10-CM | POA: Diagnosis not present

## 2020-07-16 DIAGNOSIS — F329 Major depressive disorder, single episode, unspecified: Secondary | ICD-10-CM | POA: Diagnosis not present

## 2020-07-16 DIAGNOSIS — E1122 Type 2 diabetes mellitus with diabetic chronic kidney disease: Secondary | ICD-10-CM | POA: Diagnosis not present

## 2020-07-16 DIAGNOSIS — I130 Hypertensive heart and chronic kidney disease with heart failure and stage 1 through stage 4 chronic kidney disease, or unspecified chronic kidney disease: Secondary | ICD-10-CM | POA: Diagnosis not present

## 2020-07-16 DIAGNOSIS — U071 COVID-19: Secondary | ICD-10-CM | POA: Diagnosis not present

## 2020-07-16 DIAGNOSIS — N1831 Chronic kidney disease, stage 3a: Secondary | ICD-10-CM | POA: Diagnosis not present

## 2020-07-16 DIAGNOSIS — J9601 Acute respiratory failure with hypoxia: Secondary | ICD-10-CM | POA: Diagnosis not present

## 2020-07-16 DIAGNOSIS — I255 Ischemic cardiomyopathy: Secondary | ICD-10-CM | POA: Diagnosis not present

## 2020-07-20 DIAGNOSIS — J9601 Acute respiratory failure with hypoxia: Secondary | ICD-10-CM | POA: Diagnosis not present

## 2020-07-20 DIAGNOSIS — I2510 Atherosclerotic heart disease of native coronary artery without angina pectoris: Secondary | ICD-10-CM | POA: Diagnosis not present

## 2020-07-20 DIAGNOSIS — N1831 Chronic kidney disease, stage 3a: Secondary | ICD-10-CM | POA: Diagnosis not present

## 2020-07-20 DIAGNOSIS — U071 COVID-19: Secondary | ICD-10-CM | POA: Diagnosis not present

## 2020-07-20 DIAGNOSIS — E1122 Type 2 diabetes mellitus with diabetic chronic kidney disease: Secondary | ICD-10-CM | POA: Diagnosis not present

## 2020-07-20 DIAGNOSIS — I130 Hypertensive heart and chronic kidney disease with heart failure and stage 1 through stage 4 chronic kidney disease, or unspecified chronic kidney disease: Secondary | ICD-10-CM | POA: Diagnosis not present

## 2020-07-20 DIAGNOSIS — I5032 Chronic diastolic (congestive) heart failure: Secondary | ICD-10-CM | POA: Diagnosis not present

## 2020-07-23 DIAGNOSIS — I5032 Chronic diastolic (congestive) heart failure: Secondary | ICD-10-CM | POA: Diagnosis not present

## 2020-07-23 DIAGNOSIS — E1122 Type 2 diabetes mellitus with diabetic chronic kidney disease: Secondary | ICD-10-CM | POA: Diagnosis not present

## 2020-07-23 DIAGNOSIS — F329 Major depressive disorder, single episode, unspecified: Secondary | ICD-10-CM | POA: Diagnosis not present

## 2020-07-23 DIAGNOSIS — J9601 Acute respiratory failure with hypoxia: Secondary | ICD-10-CM | POA: Diagnosis not present

## 2020-07-23 DIAGNOSIS — U071 COVID-19: Secondary | ICD-10-CM | POA: Diagnosis not present

## 2020-07-23 DIAGNOSIS — N1831 Chronic kidney disease, stage 3a: Secondary | ICD-10-CM | POA: Diagnosis not present

## 2020-07-23 DIAGNOSIS — I255 Ischemic cardiomyopathy: Secondary | ICD-10-CM | POA: Diagnosis not present

## 2020-07-23 DIAGNOSIS — I130 Hypertensive heart and chronic kidney disease with heart failure and stage 1 through stage 4 chronic kidney disease, or unspecified chronic kidney disease: Secondary | ICD-10-CM | POA: Diagnosis not present

## 2020-07-23 DIAGNOSIS — I2510 Atherosclerotic heart disease of native coronary artery without angina pectoris: Secondary | ICD-10-CM | POA: Diagnosis not present

## 2020-07-29 ENCOUNTER — Other Ambulatory Visit: Payer: Self-pay

## 2020-07-29 MED ORDER — RAMIPRIL 5 MG PO CAPS
5.0000 mg | ORAL_CAPSULE | Freq: Two times a day (BID) | ORAL | 1 refills | Status: DC
Start: 2020-07-29 — End: 2020-12-13

## 2020-08-02 DIAGNOSIS — U071 COVID-19: Secondary | ICD-10-CM | POA: Diagnosis not present

## 2020-08-02 DIAGNOSIS — J9601 Acute respiratory failure with hypoxia: Secondary | ICD-10-CM | POA: Diagnosis not present

## 2020-08-04 ENCOUNTER — Ambulatory Visit: Payer: Medicare HMO | Admitting: Physician Assistant

## 2020-08-04 ENCOUNTER — Encounter: Payer: Self-pay | Admitting: Physician Assistant

## 2020-08-04 ENCOUNTER — Other Ambulatory Visit: Payer: Self-pay

## 2020-08-04 VITALS — BP 140/76 | HR 74 | Ht 67.0 in | Wt 174.2 lb

## 2020-08-04 DIAGNOSIS — E782 Mixed hyperlipidemia: Secondary | ICD-10-CM | POA: Diagnosis not present

## 2020-08-04 DIAGNOSIS — N183 Chronic kidney disease, stage 3 unspecified: Secondary | ICD-10-CM

## 2020-08-04 DIAGNOSIS — I5032 Chronic diastolic (congestive) heart failure: Secondary | ICD-10-CM | POA: Diagnosis not present

## 2020-08-04 DIAGNOSIS — I10 Essential (primary) hypertension: Secondary | ICD-10-CM | POA: Diagnosis not present

## 2020-08-04 DIAGNOSIS — I2510 Atherosclerotic heart disease of native coronary artery without angina pectoris: Secondary | ICD-10-CM

## 2020-08-04 NOTE — Progress Notes (Signed)
Cardiology Office Note:    Date:  08/04/2020   ID:  Gilbert Obrien, DOB 1946/07/23, MRN [id number]  PCP:  Gilbert Aus, MD  [facility] Cardiologist:  Gilbert Mocha, MD  [facility] Electrophysiologist:  None   Chief Complaint: Hospital follow up   History of Present Illness:    Gilbert Obrien is a 74 y.o. male with a hx of CAD s/p multivessel PCI, ischemic cardiomyopathy with improved LV function, chronic diastolic heart failure, hypertension, hyperlipidemia, diabetes mellitus, chronic kidney disease and CVA seen for hospital follow-up.   Coronary artery disease S/p inferolateral/posterior STEMI 7/17>>multivessel PCI  S/pDES to the LCx,staged DES to the LAD and orbital atherectomy+DES to [place]  Admitted 9/30-10/8/21 acute hypoxic respiratory failure 2nd to COVID pneumonia. Treated with Remdesivir and Decadron.   Here today for follow-up.  Reports recovering well from COVID-19.  Now not requiring oxygen.  Ambulating well without chest pain or shortness of breath.  Denies orthopnea, PND, syncope, lower extremity edema or melena.  Compliant with his medication.  Past Medical History:  Diagnosis Date  . Arthritis   . Chronic kidney disease    per patient has stage 3 kidney failure   . Coronary artery disease    a. STEMI 03/2016 DESx1 to LCx, DES x 1 Mid LAD, DES x1 prox RCA  . Depression   . Diabetes mellitus without complication ([place])    type 2  . Dyspnea   . History of acute inferior wall myocardial infarction 04/15/2016   inf-lat/post >> PCI with DES of LCx; staged PCI of LAD and RCA  . Hypercholesteremia   . Hypertension   . Ischemic cardiomyopathy 04/19/2016   A. Inf-lat/post STEMI 7/17 >> b. Echo 04/17/16: Septal and post lateral HK, poor image quality, EF 35-40% // b. Echo 11/17: mild LVH, EF 50-55, inf-lat and ant-lat HK, Gr 1 DD, borderline dilated aortic root (37 mm), MAC  . Leg pain    ABIs 3/19:  normal    Past Surgical History:  Procedure  Laterality Date  . CARDIAC CATHETERIZATION N/A 04/15/2016   Procedure: Left Heart Cath and Coronary Angiography;  Surgeon: Gilbert Mocha, MD;  Location: [place] [facility];  Service: Cardiovascular;  Laterality: N/A;  . CARDIAC CATHETERIZATION N/A 04/15/2016   Procedure: Coronary Stent Intervention;  Surgeon: Gilbert Mocha, MD;  Location: [place] [facility];  Service: Cardiovascular;  Laterality: N/A;  . CARDIAC CATHETERIZATION N/A 04/17/2016   Procedure: Coronary Stent Intervention;  Surgeon: Burnell Blanks, MD;  Location: [place] [facility];  Service: Cardiovascular;  Laterality: N/A;  . CARDIAC CATHETERIZATION N/A 04/18/2016   Procedure: Coronary Stent Intervention;  Surgeon: Burnell Blanks, MD;  Location: [place] [facility];  Service: Cardiovascular;  Laterality: N/A;  . CARDIAC CATHETERIZATION N/A 04/18/2016   Procedure: Temporary Pacemaker;  Surgeon: Burnell Blanks, MD;  Location: [place] [facility];  Service: Cardiovascular;  Laterality: N/A;  . CORONARY STENT PLACEMENT  04/17/2016    Severe stenosis proximal LAD, now s/p successful PTCA/DES x 1 proximal and mid LAD  . COSMETIC SURGERY  1974   right side facial       . SHOULDER SURGERY      Current Medications: Current Meds  Medication Sig  . glimepiride (AMARYL) 2 MG tablet Take 2 mg by mouth in the morning and at bedtime.      Allergies:   Celecoxib and Sulfa antibiotics   Social History   Socioeconomic History  . Marital status: Single  Spouse name: Not on file  . Number of children: Not on file  . Years of education: Not on file  . Highest education level: Not on file  Occupational History  . Not on file  Tobacco Use  . Smoking status: Former Smoker    Quit date: 09/25/2000    Years since quitting: 19.8  . Smokeless tobacco: Never Used  . Tobacco comment: every few days last one 2 days ago  Vaping Use  . Vaping Use: Never used  Substance and Sexual Activity  . Alcohol use: No  . Drug use:  Yes    Types: Marijuana  . Sexual activity: Not on file  Other Topics Concern  . Not on file  Social History Narrative  . Not on file   Social Determinants of Health   Financial Resource Strain:   . Difficulty of Paying Living Expenses: Not on file  Food Insecurity:   . Worried About Charity fundraiser in the Last Year: Not on file  . Ran Out of Food in the Last Year: Not on file  Transportation Needs:   . Lack of Transportation (Medical): Not on file  . Lack of Transportation (Non-Medical): Not on file  Physical Activity:   . Days of Exercise per Week: Not on file  . Minutes of Exercise per Session: Not on file  Stress:   . Feeling of Stress : Not on file  Social Connections:   . Frequency of Communication with Friends and Family: Not on file  . Frequency of Social Gatherings with Friends and Family: Not on file  . Attends Religious Services: Not on file  . Active Member of Clubs or Organizations: Not on file  . Attends Archivist Meetings: Not on file  . Marital Status: Not on file     Family History: The patient's family history includes Heart attack in his brother; Heart disease in his brother.   ROS:   Please see the history of present illness.    All other systems reviewed and are negative.   EKGs/Labs/Other Studies Reviewed:    The following studies were reviewed today: Echo 09/2016 Study Conclusions   - Left ventricle: The cavity size was normal. Systolic function was  normal. The estimated ejection fraction was in the range of 50%  to 55%. Diffuse hypokinesis. Doppler parameters are consistent  with abnormal left ventricular relaxation (grade 1 diastolic  dysfunction).  - Mitral valve: Calcified annulus.   Impressions:   - No cardiac source of emboli was indentified. Compared to the  prior study, there has been no significant interval change.   EKG:  EKG is not  ordered today.   Recent Labs: 03/26/2020: B Natriuretic Peptide  83.7 06/29/2020: ALT 34; Hemoglobin 11.1; Magnesium 2.5; Platelets 356 07/02/2020: BUN 32; Creatinine, Ser 1.45; Potassium 4.0; Sodium 136  Recent Lipid Panel    Component Value Date/Time   CHOL 107 10/31/2017 1053   TRIG 85 10/31/2017 1053   HDL 33 (L) 10/31/2017 1053   CHOLHDL 3.2 10/31/2017 1053   CHOLHDL 3.7 10/04/2016 0634   VLDL 21 10/04/2016 0634   LDLCALC 57 10/31/2017 1053     Risk Assessment/Calculations:       Physical Exam:    VS:  BP 140/76   Pulse 74   Ht _0  (1.702 m)   Wt 174 lb 3.2 oz (79 kg)   SpO2 94%   BMI 27.28 kg/m     Wt Readings from Last 3 Encounters:  08/04/20 174 lb 3.2 oz (79 kg)  06/24/20 180 lb (81.6 kg)  05/25/20 190 lb 0.6 oz (86.2 kg)     GEN:  Well nourished, well developed in no acute distress HEENT: Normal NECK: No JVD; No carotid bruits LYMPHATICS: No lymphadenopathy CARDIAC: RRR, no murmurs, rubs, gallops RESPIRATORY:  Clear to auscultation without rales, wheezing or rhonchi  ABDOMEN: Soft, non-tender, non-distended MUSCULOSKELETAL:  No edema; No deformity  SKIN: Warm and dry NEUROLOGIC:  Alert and oriented x 3 PSYCHIATRIC:  Normal affect   ASSESSMENT AND PLAN:    1. CAD s/p multiple PCI's No angina.  Continue aspirin, statin and beta-blocker.  2. Chronic diastolic heart failure No CHF symptoms.  Continue Coreg, ramipril and Lasix at current dose.  3.  CKD stage III -Check renal function  4.  COVID-19 pneumonia -Recovering well.  Advised to follow-up with PCP.  He is unvaccinated and he is not planning to get vaccine.  Medication Adjustments/Labs and Tests Ordered: Current medicines are reviewed at length with the patient today.  Concerns regarding medicines are outlined above.  Orders Placed This Encounter  Procedures  . Basic metabolic panel   No orders of the defined types were placed in this encounter.   Patient Instructions  Medication Instructions:  Your physician recommends that you continue on your  current medications as directed. Please refer to the Current Medication list given to you today.  *If you need a refill on your cardiac medications before your next appointment, please call your pharmacy*   Lab Work: TODAY:  BMET  If you have labs (blood work) drawn today and your tests are completely normal, you will receive your results only by: [place] MyChart Message (if you have MyChart) OR . A paper copy in the mail If you have any lab test that is abnormal or we need to change your treatment, we will call you to review the results.   Testing/Procedures: None ordered   Follow-Up: At [facility], you and your health needs are our priority.  As part of our continuing mission to provide you with exceptional heart care, we have created designated Provider Care Teams.  These Care Teams include your primary Cardiologist (physician) and Advanced Practice Providers (APPs -  Physician Assistants and Nurse Practitioners) who all work together to provide you with the care you need, when you need it.  We recommend signing up for the patient portal called "MyChart".  Sign up information is provided on this After Visit Summary.  MyChart is used to connect with patients for Virtual Visits (Telemedicine).  Patients are able to view lab/test results, encounter notes, upcoming appointments, etc.  Non-urgent messages can be sent to your provider as well.   To learn more about what you can do with MyChart, go to [email or web address].    Your next appointment:   4 month(s)  The format for your next appointment:   In Person  Provider:   You may see Gilbert Mocha, MD or one of the following Advanced Practice Providers on your designated Care Team:    Richardson Dopp, PA-C  Robbie Lis, PA-C    Other Instructions      Signed, Leanor Kail, [place]  08/04/2020 2:09 PM    [place]

## 2020-08-04 NOTE — Patient Instructions (Addendum)
Medication Instructions:  Your physician recommends that you continue on your current medications as directed. Please refer to the Current Medication list given to you today.  *If you need a refill on your cardiac medications before your next appointment, please call your pharmacy*   Lab Work: TODAY:  BMET  If you have labs (blood work) drawn today and your tests are completely normal, you will receive your results only by: [place] Kitchen MyChart Message (if you have MyChart) OR . A paper copy in the mail If you have any lab test that is abnormal or we need to change your treatment, we will call you to review the results.   Testing/Procedures: None ordered   Follow-Up: At [facility], you and your health needs are our priority.  As part of our continuing mission to provide you with exceptional heart care, we have created designated Provider Care Teams.  These Care Teams include your primary Cardiologist (physician) and Advanced Practice Providers (APPs -  Physician Assistants and Nurse Practitioners) who all work together to provide you with the care you need, when you need it.  We recommend signing up for the patient portal called "MyChart".  Sign up information is provided on this After Visit Summary.  MyChart is used to connect with patients for Virtual Visits (Telemedicine).  Patients are able to view lab/test results, encounter notes, upcoming appointments, etc.  Non-urgent messages can be sent to your provider as well.   To learn more about what you can do with MyChart, go to [email or web address].    Your next appointment:   4 month(s)  The format for your next appointment:   In Person  Provider:   You may see Sherren Mocha, MD or one of the following Advanced Practice Providers on your designated Care Team:    Richardson Dopp, PA-C  Robbie Lis, [place]    Other Instructions

## 2020-08-05 LAB — BASIC METABOLIC PANEL
BUN/Creatinine Ratio: 8 — ABNORMAL LOW (ref 10–24)
BUN: 9 mg/dL (ref 8–27)
CO2: 21 mmol/L (ref 20–29)
Calcium: 8.3 mg/dL — ABNORMAL LOW (ref 8.6–10.2)
Chloride: 107 mmol/L — ABNORMAL HIGH (ref 96–106)
Creatinine, Ser: 1.2 mg/dL (ref 0.76–1.27)
GFR calc Af Amer: 68 mL/min/{1.73_m2} (ref >59)
GFR calc non Af Amer: 59 mL/min/{1.73_m2} — ABNORMAL LOW (ref >59)
Glucose: 381 mg/dL — ABNORMAL HIGH (ref 65–99)
Potassium: 3.8 mmol/L (ref 3.5–5.2)
Sodium: 142 mmol/L (ref 134–144)

## 2020-08-05 NOTE — Progress Notes (Signed)
Pt has been made aware of normal result and verbalized understanding.  jw

## 2020-08-09 ENCOUNTER — Other Ambulatory Visit: Payer: Self-pay | Admitting: *Deleted

## 2020-08-09 MED ORDER — FUROSEMIDE 20 MG PO TABS
20.0000 mg | ORAL_TABLET | Freq: Every day | ORAL | 3 refills | Status: DC
Start: 2020-08-09 — End: 2021-05-17

## 2020-08-11 DIAGNOSIS — L821 Other seborrheic keratosis: Secondary | ICD-10-CM | POA: Diagnosis not present

## 2020-08-11 DIAGNOSIS — X32XXXA Exposure to sunlight, initial encounter: Secondary | ICD-10-CM | POA: Diagnosis not present

## 2020-08-11 DIAGNOSIS — L570 Actinic keratosis: Secondary | ICD-10-CM | POA: Diagnosis not present

## 2020-08-11 DIAGNOSIS — D225 Melanocytic nevi of trunk: Secondary | ICD-10-CM | POA: Diagnosis not present

## 2020-08-11 DIAGNOSIS — D2261 Melanocytic nevi of right upper limb, including shoulder: Secondary | ICD-10-CM | POA: Diagnosis not present

## 2020-08-11 DIAGNOSIS — D2262 Melanocytic nevi of left upper limb, including shoulder: Secondary | ICD-10-CM | POA: Diagnosis not present

## 2020-08-11 DIAGNOSIS — Z85828 Personal history of other malignant neoplasm of skin: Secondary | ICD-10-CM | POA: Diagnosis not present

## 2020-11-22 ENCOUNTER — Other Ambulatory Visit: Payer: Self-pay | Admitting: Cardiovascular Disease

## 2020-12-13 ENCOUNTER — Other Ambulatory Visit: Payer: Self-pay | Admitting: Cardiovascular Disease

## 2021-01-12 ENCOUNTER — Other Ambulatory Visit: Payer: Self-pay | Admitting: Cardiovascular Disease

## 2021-02-28 ENCOUNTER — Other Ambulatory Visit: Payer: Self-pay | Admitting: Cardiovascular Disease

## 2021-03-23 DIAGNOSIS — E785 Hyperlipidemia, unspecified: Secondary | ICD-10-CM | POA: Diagnosis not present

## 2021-03-23 DIAGNOSIS — Z85828 Personal history of other malignant neoplasm of skin: Secondary | ICD-10-CM | POA: Diagnosis not present

## 2021-03-23 DIAGNOSIS — G4700 Insomnia, unspecified: Secondary | ICD-10-CM | POA: Diagnosis not present

## 2021-03-23 DIAGNOSIS — R600 Localized edema: Secondary | ICD-10-CM | POA: Diagnosis not present

## 2021-03-23 DIAGNOSIS — F325 Major depressive disorder, single episode, in full remission: Secondary | ICD-10-CM | POA: Diagnosis not present

## 2021-03-23 DIAGNOSIS — Z87891 Personal history of nicotine dependence: Secondary | ICD-10-CM | POA: Diagnosis not present

## 2021-03-23 DIAGNOSIS — E119 Type 2 diabetes mellitus without complications: Secondary | ICD-10-CM | POA: Diagnosis not present

## 2021-03-23 DIAGNOSIS — I2510 Atherosclerotic heart disease of native coronary artery without angina pectoris: Secondary | ICD-10-CM | POA: Diagnosis not present

## 2021-03-23 DIAGNOSIS — I255 Ischemic cardiomyopathy: Secondary | ICD-10-CM | POA: Diagnosis not present

## 2021-03-23 DIAGNOSIS — Z9181 History of falling: Secondary | ICD-10-CM | POA: Diagnosis not present

## 2021-03-23 DIAGNOSIS — I10 Essential (primary) hypertension: Secondary | ICD-10-CM | POA: Diagnosis not present

## 2021-03-23 DIAGNOSIS — I252 Old myocardial infarction: Secondary | ICD-10-CM | POA: Diagnosis not present

## 2021-05-16 ENCOUNTER — Other Ambulatory Visit: Payer: Self-pay | Admitting: Cardiovascular Disease

## 2021-07-20 DIAGNOSIS — D045 Carcinoma in situ of skin of trunk: Secondary | ICD-10-CM | POA: Diagnosis not present

## 2021-07-20 DIAGNOSIS — L570 Actinic keratosis: Secondary | ICD-10-CM | POA: Diagnosis not present

## 2021-07-20 DIAGNOSIS — D0462 Carcinoma in situ of skin of left upper limb, including shoulder: Secondary | ICD-10-CM | POA: Diagnosis not present

## 2021-07-20 DIAGNOSIS — D485 Neoplasm of uncertain behavior of skin: Secondary | ICD-10-CM | POA: Diagnosis not present

## 2021-07-22 ENCOUNTER — Other Ambulatory Visit: Payer: Self-pay | Admitting: Cardiovascular Disease

## 2021-08-01 DIAGNOSIS — R339 Retention of urine, unspecified: Secondary | ICD-10-CM | POA: Diagnosis not present

## 2021-08-01 DIAGNOSIS — E1165 Type 2 diabetes mellitus with hyperglycemia: Secondary | ICD-10-CM | POA: Diagnosis not present

## 2021-08-01 DIAGNOSIS — E782 Mixed hyperlipidemia: Secondary | ICD-10-CM | POA: Diagnosis not present

## 2021-08-01 DIAGNOSIS — I10 Essential (primary) hypertension: Secondary | ICD-10-CM | POA: Diagnosis not present

## 2021-08-01 DIAGNOSIS — R5383 Other fatigue: Secondary | ICD-10-CM | POA: Diagnosis not present

## 2021-08-01 DIAGNOSIS — F32A Depression, unspecified: Secondary | ICD-10-CM | POA: Diagnosis not present

## 2021-10-14 DIAGNOSIS — I10 Essential (primary) hypertension: Secondary | ICD-10-CM | POA: Diagnosis not present

## 2021-10-14 DIAGNOSIS — R339 Retention of urine, unspecified: Secondary | ICD-10-CM | POA: Diagnosis not present

## 2021-10-14 DIAGNOSIS — E1165 Type 2 diabetes mellitus with hyperglycemia: Secondary | ICD-10-CM | POA: Diagnosis not present

## 2021-10-14 DIAGNOSIS — E782 Mixed hyperlipidemia: Secondary | ICD-10-CM | POA: Diagnosis not present

## 2021-10-14 DIAGNOSIS — R5383 Other fatigue: Secondary | ICD-10-CM | POA: Diagnosis not present

## 2021-10-17 DIAGNOSIS — I10 Essential (primary) hypertension: Secondary | ICD-10-CM | POA: Diagnosis not present

## 2021-10-17 DIAGNOSIS — M1990 Unspecified osteoarthritis, unspecified site: Secondary | ICD-10-CM | POA: Diagnosis not present

## 2021-10-17 DIAGNOSIS — E1165 Type 2 diabetes mellitus with hyperglycemia: Secondary | ICD-10-CM | POA: Diagnosis not present

## 2021-10-17 DIAGNOSIS — E782 Mixed hyperlipidemia: Secondary | ICD-10-CM | POA: Diagnosis not present

## 2022-01-26 DIAGNOSIS — I10 Essential (primary) hypertension: Secondary | ICD-10-CM | POA: Diagnosis not present

## 2022-01-26 DIAGNOSIS — E1165 Type 2 diabetes mellitus with hyperglycemia: Secondary | ICD-10-CM | POA: Diagnosis not present

## 2022-01-26 DIAGNOSIS — E782 Mixed hyperlipidemia: Secondary | ICD-10-CM | POA: Diagnosis not present

## 2022-01-30 DIAGNOSIS — M1990 Unspecified osteoarthritis, unspecified site: Secondary | ICD-10-CM | POA: Diagnosis not present

## 2022-01-30 DIAGNOSIS — E1165 Type 2 diabetes mellitus with hyperglycemia: Secondary | ICD-10-CM | POA: Diagnosis not present

## 2022-01-30 DIAGNOSIS — I10 Essential (primary) hypertension: Secondary | ICD-10-CM | POA: Diagnosis not present

## 2022-01-30 DIAGNOSIS — E782 Mixed hyperlipidemia: Secondary | ICD-10-CM | POA: Diagnosis not present

## 2022-02-08 DIAGNOSIS — D0462 Carcinoma in situ of skin of left upper limb, including shoulder: Secondary | ICD-10-CM | POA: Diagnosis not present

## 2022-02-08 DIAGNOSIS — D045 Carcinoma in situ of skin of trunk: Secondary | ICD-10-CM | POA: Diagnosis not present

## 2022-02-16 ENCOUNTER — Ambulatory Visit: Payer: Medicare HMO | Admitting: Cardiovascular Disease

## 2022-02-16 ENCOUNTER — Encounter: Payer: Self-pay | Admitting: Cardiovascular Disease

## 2022-02-16 VITALS — BP 116/62 | HR 65 | Ht 67.0 in | Wt 175.6 lb

## 2022-02-16 DIAGNOSIS — N183 Chronic kidney disease, stage 3 unspecified: Secondary | ICD-10-CM

## 2022-02-16 DIAGNOSIS — E782 Mixed hyperlipidemia: Secondary | ICD-10-CM | POA: Diagnosis not present

## 2022-02-16 DIAGNOSIS — I2510 Atherosclerotic heart disease of native coronary artery without angina pectoris: Secondary | ICD-10-CM | POA: Diagnosis not present

## 2022-02-16 DIAGNOSIS — I10 Essential (primary) hypertension: Secondary | ICD-10-CM

## 2022-02-16 DIAGNOSIS — I5032 Chronic diastolic (congestive) heart failure: Secondary | ICD-10-CM

## 2022-02-16 NOTE — Patient Instructions (Signed)
Medication Instructions:  Your physician recommends that you continue on your current medications as directed. Please refer to the Current Medication list given to you today.  *If you need a refill on your cardiac medications before your next appointment, please call your pharmacy*   Lab Work: NONE If you have labs (blood work) drawn today and your tests are completely normal, you will receive your results only by: [place] (if you have MyChart) OR A paper copy in the mail If you have any lab test that is abnormal or we need to change your treatment, we will call you to review the results.   Testing/Procedures: ECHO [employer] office) Your physician has requested that you have an echocardiogram. Echocardiography is a painless test that uses sound waves to create images of your heart. It provides your doctor with information about the size and shape of your heart and how well your heart's chambers and valves are working. This procedure takes approximately one hour. There are no restrictions for this procedure.  Follow-Up: At [facility], you and your health needs are our priority.  As part of our continuing mission to provide you with exceptional heart care, we have created designated Provider Care Teams.  These Care Teams include your primary Cardiologist (physician) and Advanced Practice Providers (APPs -  Physician Assistants and Nurse Practitioners) who all work together to provide you with the care you need, when you need it.  We recommend signing up for the patient portal called "MyChart".  Sign up information is provided on this After Visit Summary.  MyChart is used to connect with patients for Virtual Visits (Telemedicine).  Patients are able to view lab/test results, encounter notes, upcoming appointments, etc.  Non-urgent messages can be sent to your provider as well.   To learn more about what you can do with MyChart, go to [email or web address].    Your next  appointment:   1 year(s)  The format for your next appointment:   In Person  Provider:   Sherren Mocha, MD       Important Information About Sugar

## 2022-02-16 NOTE — Progress Notes (Signed)
Cardiology Office Note:    Date:  02/16/2022   ID:  REGGINALD PASK, DOB Apr 25, 1946, MRN [id number]  PCP:  [name], MD   [facility] Providers Cardiologist:  Sherren Mocha, MD     Referring MD: [name], MD   Chief Complaint  Patient presents with   Coronary Artery Disease    History of Present Illness:    Gilbert Obrien is a 76 y.o. male with a hx of coronary artery disease, presenting for follow-up evaluation.  The patient has a history of multivessel CAD treated with PCI, ischemic cardiomyopathy with improved LV function after revascularization and medical therapy, chronic diastolic heart failure, type 2 diabetes, chronic kidney disease, and history of stroke.  He initially presented in 2017 with an inferolateral/posterior STEMI treated with primary PCI.  He was treated with a drug-eluting stent in the circumflex.  He then underwent staged PCI with orbital atherectomy and stenting in the LAD and stenting of the right coronary artery. 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 He is very limited by 'bone-on-bone' arthritis. He is very limited by knee problems and is unable to do a lot of walking anymore.  He denies symptoms of chest pain, chest pressure, edema, orthopnea, PND, heart palpitations, or shortness of breath.  He has stopped taking furosemide because he was having trouble with the frequency of urination.  He reports no other medication changes.  Past Medical History:  Diagnosis Date   Arthritis    Chronic kidney disease    per patient has stage 3 kidney failure    Coronary  artery disease    a. STEMI 03/2016 DESx1 to LCx, DES x 1 Mid LAD, DES x1 prox RCA   Depression    Diabetes mellitus without complication (HCC)    type 2   Dyspnea    History of acute inferior wall myocardial infarction 04/15/2016   inf-lat/post >> PCI with DES of LCx; staged PCI of LAD and RCA   Hypercholesteremia    Hypertension    Ischemic cardiomyopathy 04/19/2016   A. Inf-lat/post STEMI 7/17 >> b. Echo 04/17/16: Septal and post lateral HK, poor image quality, EF 35-40% // b. Echo 11/17: mild LVH, EF 50-55, inf-lat and ant-lat HK, Gr 1 DD, borderline dilated aortic root (37 mm), MAC   Leg pain    ABIs 3/19:  normal    Past Surgical History:  Procedure Laterality Date   CARDIAC CATHETERIZATION N/A 04/15/2016   Procedure: Left Heart Cath and Coronary Angiography;  Surgeon: Sherren Mocha, MD;  Location: [place] [facility];  Service: Cardiovascular;  Laterality: N/A;   CARDIAC CATHETERIZATION N/A 04/15/2016   Procedure: Coronary Stent Intervention;  Surgeon: Sherren Mocha, MD;  Location: [place] [facility];  Service: Cardiovascular;  Laterality: N/A;   CARDIAC CATHETERIZATION N/A 04/17/2016   Procedure: Coronary Stent Intervention;  Surgeon: Burnell Blanks, MD;  Location: [place] [facility];  Service: Cardiovascular;  Laterality: N/A;   CARDIAC CATHETERIZATION N/A 04/18/2016   Procedure: Coronary Stent Intervention;  Surgeon: Burnell Blanks, MD;  Location: [place] [facility];  Service: Cardiovascular;  Laterality: N/A;   CARDIAC  CATHETERIZATION N/A 04/18/2016   Procedure: Temporary Pacemaker;  Surgeon: Burnell Blanks, MD;  Location: [place] [facility];  Service: Cardiovascular;  Laterality: N/A;   CORONARY STENT PLACEMENT  04/17/2016    Severe stenosis proximal LAD, now s/p successful PTCA/DES x 1 proximal and mid LAD   COSMETIC SURGERY  1974   right side facial        SHOULDER SURGERY      Current Medications: Current Meds  Medication Sig   albuterol (VENTOLIN  HFA) 108 (90 Base) MCG/ACT inhaler Inhale 2 puffs into the lungs every 6 (six) hours as needed for wheezing or shortness of breath.   amLODipine (NORVASC) 10 MG tablet TAKE 1 TABLET EVERY DAY   aspirin EC 81 MG tablet Take 1 tablet (81 mg total) by mouth daily.   atorvastatin (LIPITOR) 80 MG tablet Take 1 tablet (80 mg total) by mouth daily at 6 PM.   buPROPion (WELLBUTRIN XL) 150 MG 24 hr tablet Take 150 mg by mouth daily.   carvedilol (COREG) 25 MG tablet TAKE 1 TABLET IN THE MORNING AND AT BEDTIME (PLEASE CALL TO SCHEDULE APPOINTMENT WITH MD FOR FURTHER REFILLS)   clotrimazole (LOTRIMIN) 1 % cream Apply topically daily.   glimepiride (AMARYL) 2 MG tablet Take 2 mg by mouth in the morning and at bedtime.    ramipril (ALTACE) 5 MG capsule Take 1 capsule (5 mg total) by mouth 2 (two) times daily.   sertraline (ZOLOFT) 50 MG tablet Take 50 mg by mouth daily.   temazepam (RESTORIL) 15 MG capsule Take 15 mg by mouth at bedtime as needed for sleep.      Allergies:   Celecoxib and Sulfa antibiotics   Social History   Socioeconomic History   Marital status: Single    Spouse name: Not on file   Number of children: Not on file   Years of education: Not on file   Highest education level: Not on file  Occupational History   Not on file  Tobacco Use   Smoking status: Former    Types: Cigarettes    Quit date: 09/25/2000    Years since quitting: 21.4   Smokeless tobacco: Never   Tobacco comments:    every few days last one 2 days ago  Vaping Use   Vaping Use: Never used  Substance and Sexual Activity   Alcohol use: No   Drug use: Yes    Types: Marijuana   Sexual activity: Not on file  Other Topics Concern   Not on file  Social History Narrative   Not on file   Social Determinants of Health   Financial Resource Strain: Not on file  Food Insecurity: Not on file  Transportation Needs: Not on file  Physical Activity: Not on file  Stress: Not on file  Social Connections: Not on file      Family History: The patient's family history includes Heart attack in his brother; Heart disease in his brother.  ROS:   Please see the history of present illness.    All other systems reviewed and are negative.  EKGs/Labs/Other Studies Reviewed:    The following studies were reviewed today: Echo 10/04/2016: Study Conclusions   - Left ventricle: The cavity size was normal. Systolic function was    normal. The estimated ejection fraction was in the range of 50%    to 55%. Diffuse hypokinesis. Doppler parameters are consistent    with abnormal left ventricular relaxation (grade 1 diastolic    dysfunction).  -  Mitral valve: Calcified annulus.  EKG:  EKG is ordered today.  The ekg ordered today demonstrates NSR 65 bpm, age-indeterminate inferior infarct, minimal voltage criteria for LVH may be normal variant  Recent Labs: No results found for requested labs within last 8760 hours.  Recent Lipid Panel    Component Value Date/Time   CHOL 107 10/31/2017 1053   TRIG 85 10/31/2017 1053   HDL 33 (L) 10/31/2017 1053   CHOLHDL 3.2 10/31/2017 1053   CHOLHDL 3.7 10/04/2016 0634   VLDL 21 10/04/2016 0634   LDLCALC 57 10/31/2017 1053     Risk Assessment/Calculations:           Physical Exam:    VS:  BP 116/62   Pulse 65   Ht _0  (1.702 m)   Wt 175 lb 9.6 oz (79.7 kg)   SpO2 98%   BMI 27.50 kg/m     Wt Readings from Last 3 Encounters:  02/16/22 175 lb 9.6 oz (79.7 kg)  08/04/20 174 lb 3.2 oz (79 kg)  06/24/20 180 lb (81.6 kg)     GEN:  Well nourished, well developed in no acute distress HEENT: Normal NECK: No JVD; No carotid bruits LYMPHATICS: No lymphadenopathy CARDIAC: RRR, no murmurs, rubs, gallops RESPIRATORY:  Clear to auscultation without rales, wheezing or rhonchi  ABDOMEN: Soft, non-tender, non-distended MUSCULOSKELETAL:  No edema; No deformity  SKIN: Warm and dry NEUROLOGIC:  Alert and oriented x 3 PSYCHIATRIC:  Normal affect   ASSESSMENT:    1.  Coronary artery disease involving native coronary artery of native heart without angina pectoris   2. Chronic diastolic CHF (congestive heart failure) (HCC)   3. Stage 3 chronic kidney disease, unspecified whether stage 3a or 3b CKD ([place])   4. Essential hypertension   5. Mixed hyperlipidemia    PLAN:    In order of problems listed above:  Continue aspirin for antiplatelet therapy, amlodipine and carvedilol for antianginal treatment, and a high intensity statin drug with atorvastatin. Appears euvolemic on exam.  I recommended a repeat 2D echocardiogram since its been 5 years from the time of his last study.  His last LVEF was in the low normal range at 50%. Treated with an ACE inhibitor.  Labs are followed closely by his PCP. Blood pressure is well controlled on a combination of ramipril, carvedilol, and amlodipine. Treated with atorvastatin 80 mg daily.  Lipids followed by PCP.  Patient states he has labs checked at least twice per year.  We do not have copies of his labs as they are not available through epic or Care Everywhere.     Medication Adjustments/Labs and Tests Ordered: Current medicines are reviewed at length with the patient today.  Concerns regarding medicines are outlined above.  Orders Placed This Encounter  Procedures   EKG 12-Lead   ECHOCARDIOGRAM COMPLETE   No orders of the defined types were placed in this encounter.   Patient Instructions  Medication Instructions:  Your physician recommends that you continue on your current medications as directed. Please refer to the Current Medication list given to you today.  *If you need a refill on your cardiac medications before your next appointment, please call your pharmacy*   Lab Work: NONE If you have labs (blood work) drawn today and your tests are completely normal, you will receive your results only by: [place] (if you have MyChart) OR A paper copy in the mail If you have any lab test that is abnormal  or we need  to change your treatment, we will call you to review the results.   Testing/Procedures: ECHO [employer] office) Your physician has requested that you have an echocardiogram. Echocardiography is a painless test that uses sound waves to create images of your heart. It provides your doctor with information about the size and shape of your heart and how well your heart's chambers and valves are working. This procedure takes approximately one hour. There are no restrictions for this procedure.  Follow-Up: At [facility], you and your health needs are our priority.  As part of our continuing mission to provide you with exceptional heart care, we have created designated Provider Care Teams.  These Care Teams include your primary Cardiologist (physician) and Advanced Practice Providers (APPs -  Physician Assistants and Nurse Practitioners) who all work together to provide you with the care you need, when you need it.  We recommend signing up for the patient portal called "MyChart".  Sign up information is provided on this After Visit Summary.  MyChart is used to connect with patients for Virtual Visits (Telemedicine).  Patients are able to view lab/test results, encounter notes, upcoming appointments, etc.  Non-urgent messages can be sent to your provider as well.   To learn more about what you can do with MyChart, go to [email or web address].    Your next appointment:   1 year(s)  The format for your next appointment:   In Person  Provider:   Sherren Mocha, MD       Important Information About Sugar         Signed, Sherren Mocha, MD  02/16/2022 4:52 PM    [place]

## 2022-02-19 IMAGING — RF DG CHOLANGIOGRAM VIA EXIST CATHETER
5 series · 15 of 18 positions shown · non-contrast
Comparison: none

CLINICAL DATA: Calculus cholecystitis

EXAM:
CHOLECYSTOSTOMY CATHETER INJECTION UNDER FLUOROSCOPY
TECHNIQUE: The procedure, risks (including but not limited to bleeding,
infection, organ damage), benefits, and alternatives were explained
to the patient. Questions regarding the procedure were encouraged
and answered. The patient understands and consents to the procedure.

[Series 1: fluoro_iodine 2fps_bw · 0.17mm/px · 2 of 3 frames shown (1 of 2)]
[frame 1/3]
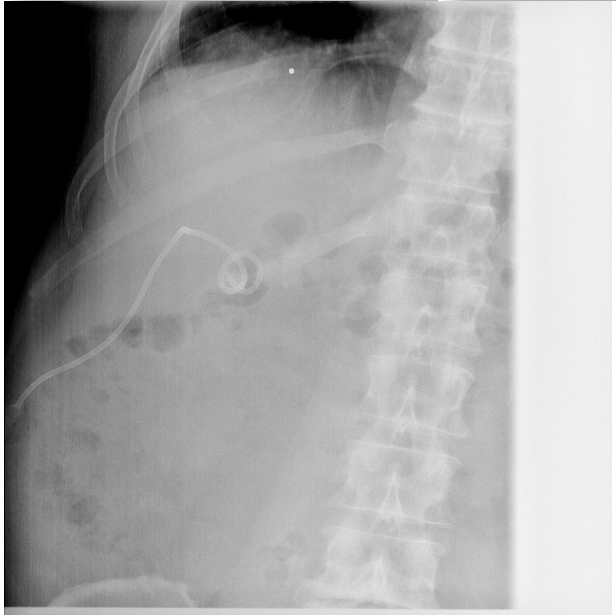
[frame 2/3]
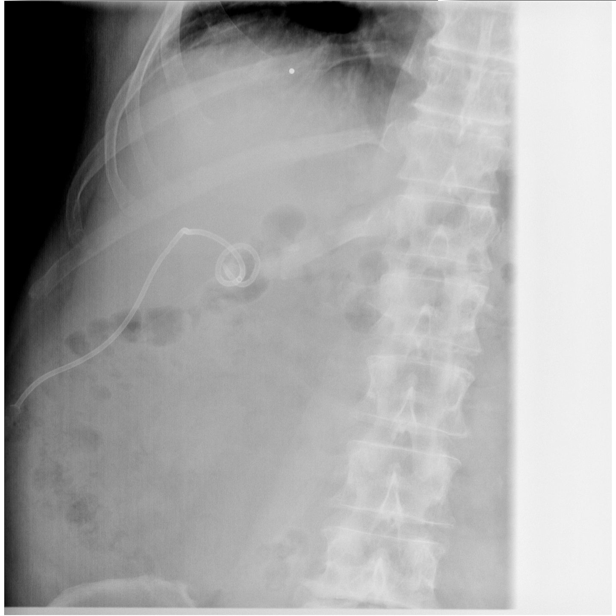

[Series 2: fluoro_iodine 2fps_bw · 0.17mm/px · 3 of 30 frames shown (2 of 2)]
[frame 5/30]
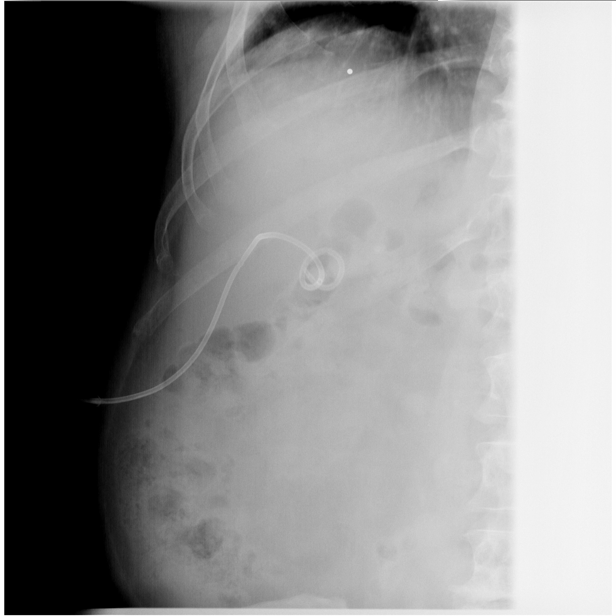
[frame 16/30]
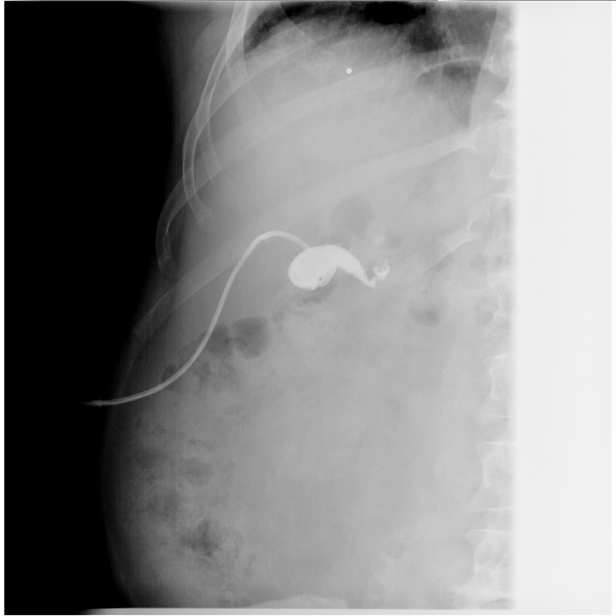
[frame 26/30]
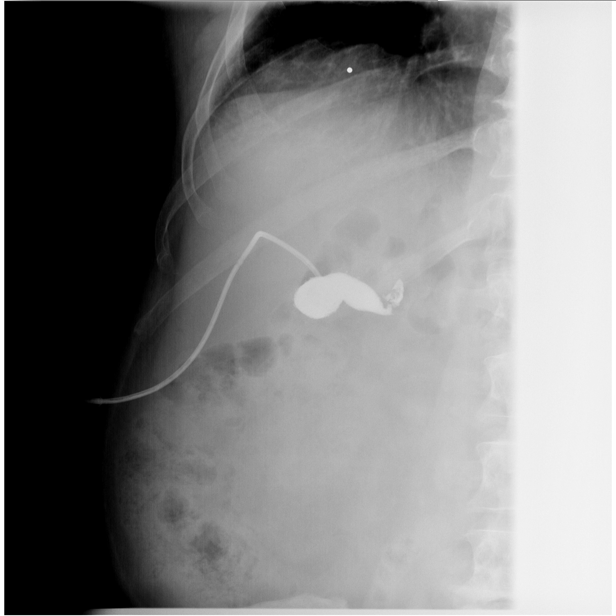

[Series 3: cp_standard · 0.26mm/px · 3 of 127 frames shown (1 of 3)]
[frame 20/127]
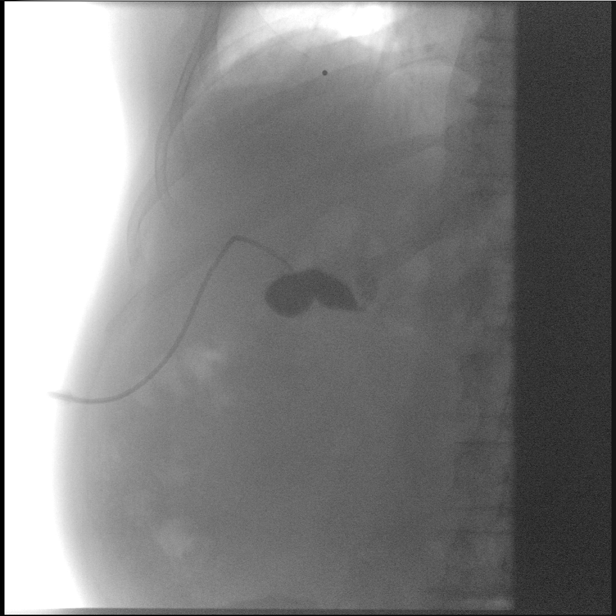
[frame 64/127]
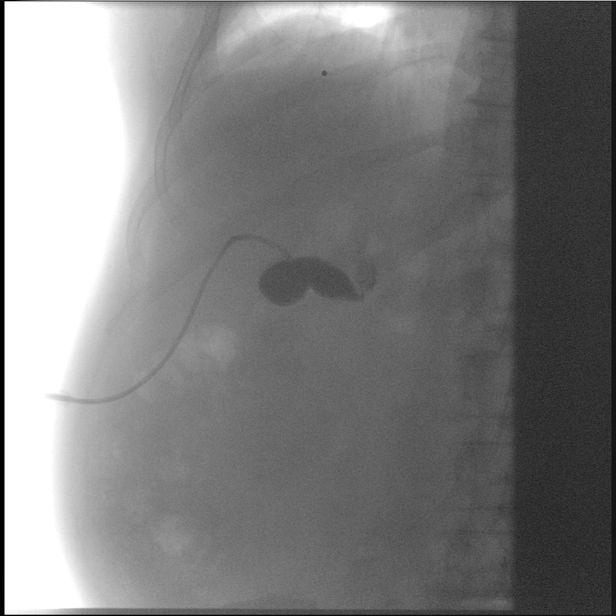
[frame 108/127]
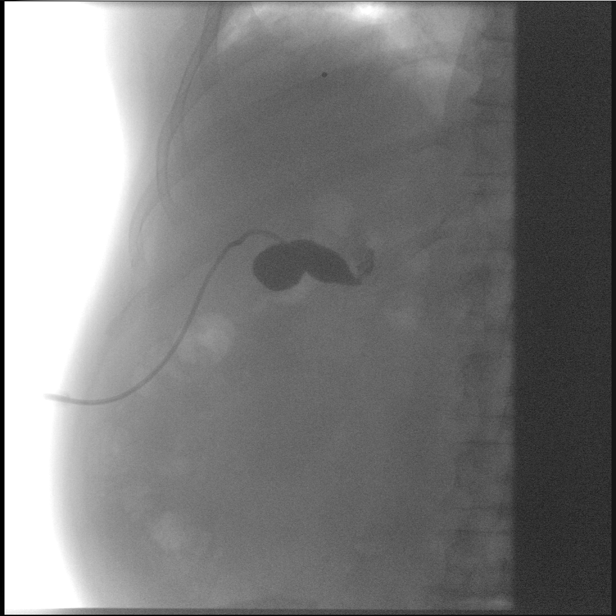

[Series 4: cp_standard · 0.26mm/px · 4 of 49 frames shown (2 of 3)]
[frame 8/49]
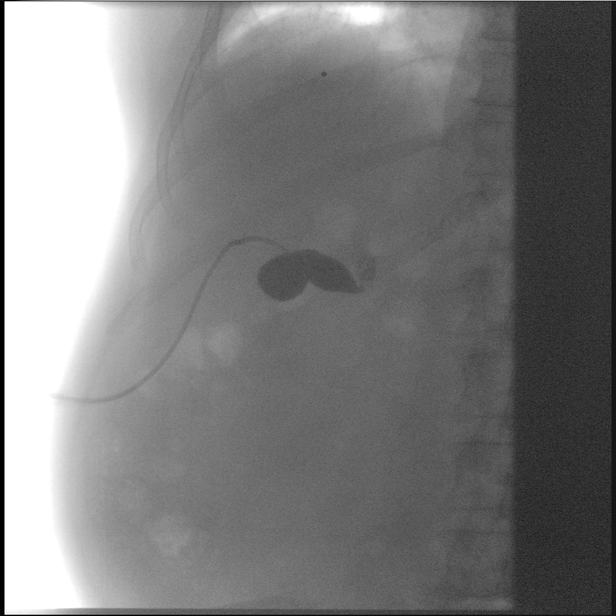
[frame 10/49]
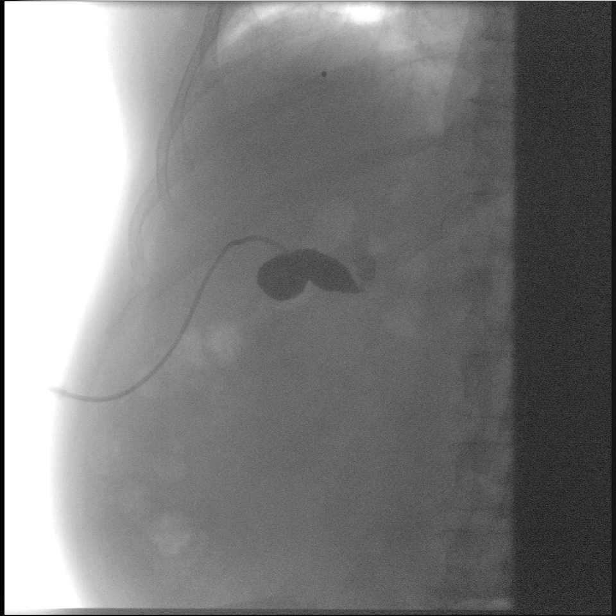
[frame 25/49]
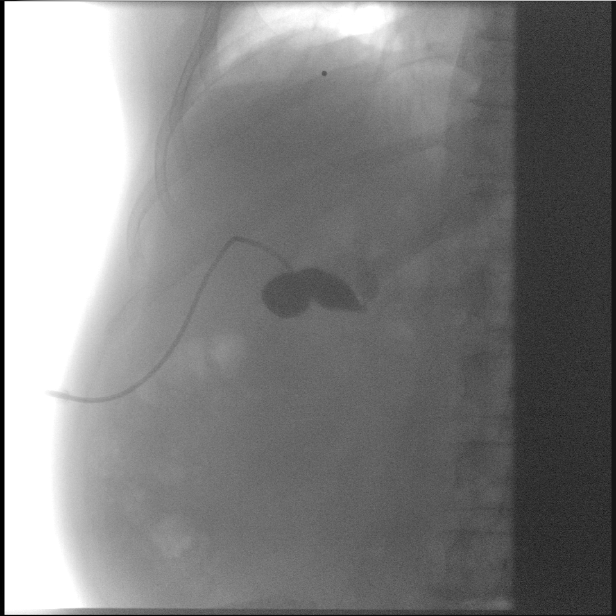
[frame 42/49]
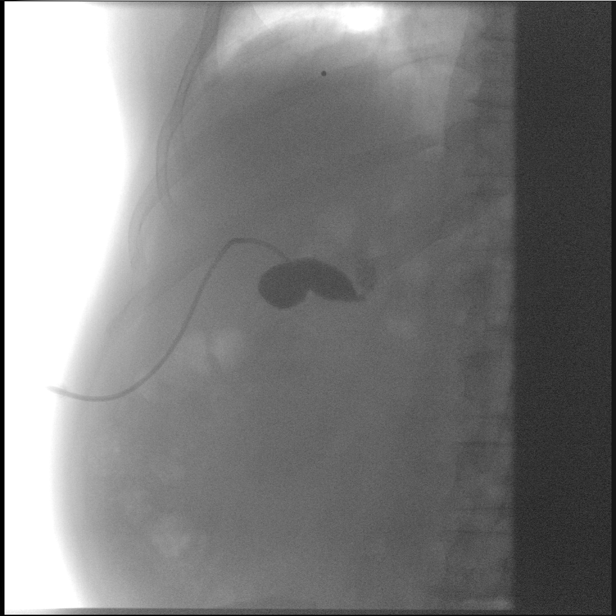

[Series 5: cp_standard · 0.17mm/px · 3 of 49 frames shown (3 of 3)]
[frame 25/49]
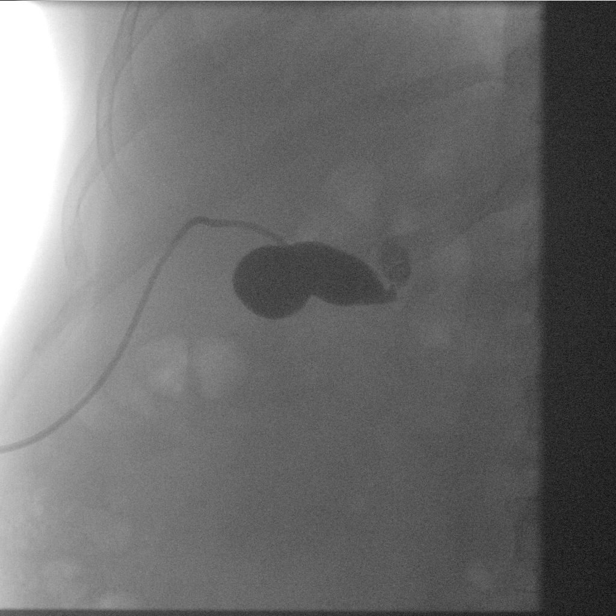
[frame 42/49]
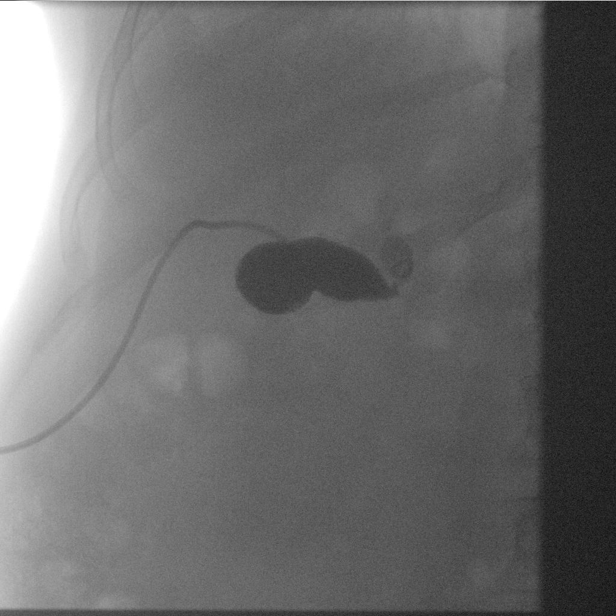
[frame 45/49]
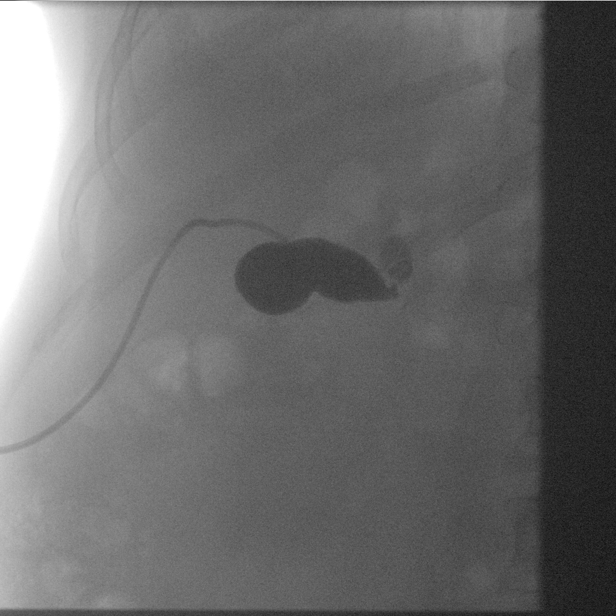

[15 of 18 positions shown; findings below may reference images not displayed]

Survey fluoroscopic inspection reveals stable position of pigtail
cholecystostomy catheter.

Injection demonstrates non filling of the CBD. Patient experienced
pain after 20 mL contrast administered, so the procedure was
terminated. No extravasation identified.

FLUOROSCOPY TIME:  24 seconds

COMPLICATIONS:
None immediate
IMPRESSION: 1. Cholecystostomy catheter in good position. Non filling of the
CBD.

## 2022-03-21 ENCOUNTER — Ambulatory Visit (INDEPENDENT_AMBULATORY_CARE_PROVIDER_SITE_OTHER): Payer: Medicare HMO

## 2022-03-21 DIAGNOSIS — I5032 Chronic diastolic (congestive) heart failure: Secondary | ICD-10-CM | POA: Diagnosis not present

## 2022-03-22 ENCOUNTER — Telehealth: Payer: Self-pay | Admitting: Cardiovascular Disease

## 2022-03-22 LAB — ECHOCARDIOGRAM COMPLETE
AR max vel: 2.05 cm2
AV Area VTI: 2.17 cm2
AV Area mean vel: 2.12 cm2
AV Mean grad: 5 mmHg
AV Peak grad: 8.8 mmHg
Ao pk vel: 1.48 m/s
Area-P 1/2: 4.49 cm2
Calc EF: 43.8 %
S' Lateral: 3.05 cm
Single Plane A2C EF: 45.6 %
Single Plane A4C EF: 39.9 %

## 2022-03-22 NOTE — Telephone Encounter (Signed)
Sent mychart message advising Dr. [name] had not reviewed echo results yet.  Advised will call when he has.

## 2022-03-22 NOTE — Telephone Encounter (Signed)
Pt is requesting a call in regards to his echo results. Please call back.

## 2022-03-24 DIAGNOSIS — M1990 Unspecified osteoarthritis, unspecified site: Secondary | ICD-10-CM | POA: Diagnosis not present

## 2022-03-24 DIAGNOSIS — E1165 Type 2 diabetes mellitus with hyperglycemia: Secondary | ICD-10-CM | POA: Diagnosis not present

## 2022-03-24 DIAGNOSIS — I10 Essential (primary) hypertension: Secondary | ICD-10-CM | POA: Diagnosis not present

## 2022-05-17 DIAGNOSIS — I10 Essential (primary) hypertension: Secondary | ICD-10-CM | POA: Diagnosis not present

## 2022-05-17 DIAGNOSIS — E782 Mixed hyperlipidemia: Secondary | ICD-10-CM | POA: Diagnosis not present

## 2022-05-17 DIAGNOSIS — E1165 Type 2 diabetes mellitus with hyperglycemia: Secondary | ICD-10-CM | POA: Diagnosis not present

## 2022-05-25 DIAGNOSIS — I10 Essential (primary) hypertension: Secondary | ICD-10-CM | POA: Diagnosis not present

## 2022-05-25 DIAGNOSIS — E1165 Type 2 diabetes mellitus with hyperglycemia: Secondary | ICD-10-CM | POA: Diagnosis not present

## 2022-05-25 DIAGNOSIS — E782 Mixed hyperlipidemia: Secondary | ICD-10-CM | POA: Diagnosis not present

## 2022-05-25 DIAGNOSIS — N189 Chronic kidney disease, unspecified: Secondary | ICD-10-CM | POA: Diagnosis not present

## 2022-06-24 DIAGNOSIS — I10 Essential (primary) hypertension: Secondary | ICD-10-CM | POA: Diagnosis not present

## 2022-06-24 DIAGNOSIS — M1990 Unspecified osteoarthritis, unspecified site: Secondary | ICD-10-CM | POA: Diagnosis not present

## 2022-06-24 DIAGNOSIS — E1165 Type 2 diabetes mellitus with hyperglycemia: Secondary | ICD-10-CM | POA: Diagnosis not present

## 2022-07-25 DIAGNOSIS — I10 Essential (primary) hypertension: Secondary | ICD-10-CM | POA: Diagnosis not present

## 2022-07-25 DIAGNOSIS — M1990 Unspecified osteoarthritis, unspecified site: Secondary | ICD-10-CM | POA: Diagnosis not present

## 2022-07-25 DIAGNOSIS — E1165 Type 2 diabetes mellitus with hyperglycemia: Secondary | ICD-10-CM | POA: Diagnosis not present

## 2022-08-16 DIAGNOSIS — D2261 Melanocytic nevi of right upper limb, including shoulder: Secondary | ICD-10-CM | POA: Diagnosis not present

## 2022-08-16 DIAGNOSIS — X32XXXA Exposure to sunlight, initial encounter: Secondary | ICD-10-CM | POA: Diagnosis not present

## 2022-08-16 DIAGNOSIS — D2262 Melanocytic nevi of left upper limb, including shoulder: Secondary | ICD-10-CM | POA: Diagnosis not present

## 2022-08-16 DIAGNOSIS — S60940A Unspecified superficial injury of right index finger, initial encounter: Secondary | ICD-10-CM | POA: Diagnosis not present

## 2022-08-16 DIAGNOSIS — L570 Actinic keratosis: Secondary | ICD-10-CM | POA: Diagnosis not present

## 2022-08-16 DIAGNOSIS — L309 Dermatitis, unspecified: Secondary | ICD-10-CM | POA: Diagnosis not present

## 2022-08-16 DIAGNOSIS — D225 Melanocytic nevi of trunk: Secondary | ICD-10-CM | POA: Diagnosis not present

## 2022-08-21 DIAGNOSIS — I10 Essential (primary) hypertension: Secondary | ICD-10-CM | POA: Diagnosis not present

## 2022-08-21 DIAGNOSIS — E1165 Type 2 diabetes mellitus with hyperglycemia: Secondary | ICD-10-CM | POA: Diagnosis not present

## 2022-08-21 DIAGNOSIS — E782 Mixed hyperlipidemia: Secondary | ICD-10-CM | POA: Diagnosis not present

## 2022-08-22 DIAGNOSIS — E1165 Type 2 diabetes mellitus with hyperglycemia: Secondary | ICD-10-CM | POA: Diagnosis not present

## 2022-08-22 DIAGNOSIS — I10 Essential (primary) hypertension: Secondary | ICD-10-CM | POA: Diagnosis not present

## 2022-08-22 DIAGNOSIS — E782 Mixed hyperlipidemia: Secondary | ICD-10-CM | POA: Diagnosis not present

## 2022-08-22 DIAGNOSIS — M1990 Unspecified osteoarthritis, unspecified site: Secondary | ICD-10-CM | POA: Diagnosis not present

## 2022-08-22 DIAGNOSIS — N189 Chronic kidney disease, unspecified: Secondary | ICD-10-CM | POA: Diagnosis not present

## 2022-08-24 DIAGNOSIS — I10 Essential (primary) hypertension: Secondary | ICD-10-CM | POA: Diagnosis not present

## 2022-08-24 DIAGNOSIS — E1165 Type 2 diabetes mellitus with hyperglycemia: Secondary | ICD-10-CM | POA: Diagnosis not present

## 2022-09-23 DIAGNOSIS — I10 Essential (primary) hypertension: Secondary | ICD-10-CM | POA: Diagnosis not present

## 2022-09-23 DIAGNOSIS — E1165 Type 2 diabetes mellitus with hyperglycemia: Secondary | ICD-10-CM | POA: Diagnosis not present

## 2022-11-06 ENCOUNTER — Telehealth: Payer: Self-pay | Admitting: Cardiovascular Disease

## 2022-11-06 NOTE — Telephone Encounter (Signed)
Patient states on Saturday, 2/10, he received a notification advising him to log into MyChart because he has a new message from this office. He would like to know what this was regarding. Please advise.

## 2022-11-07 NOTE — Telephone Encounter (Signed)
Appointment Information:     Visit Type: Follow up visit with Care Management Staff in Office         Date: 03/05/2023                 Dept: [facility]                 Provider: AMA-AMA CCM PHARMACIST                 Time: 2:00 PM                 Length: 60 min  Returned call to patient to make him aware that the above appt information is what was sent to him via [place]. He asks that we cancel his MyChart, stating that it's far too difficult to use and he's gotten locked out too many times. Account deactivated at this time. No further questions.

## 2022-11-23 DIAGNOSIS — E1165 Type 2 diabetes mellitus with hyperglycemia: Secondary | ICD-10-CM | POA: Diagnosis not present

## 2022-11-23 DIAGNOSIS — M1990 Unspecified osteoarthritis, unspecified site: Secondary | ICD-10-CM | POA: Diagnosis not present

## 2022-11-23 DIAGNOSIS — I10 Essential (primary) hypertension: Secondary | ICD-10-CM | POA: Diagnosis not present

## 2022-12-07 ENCOUNTER — Telehealth: Payer: Self-pay

## 2022-12-07 NOTE — Telephone Encounter (Signed)
Diabetes (DM) Review Call  Gilbert Obrien,Gilbert Obrien  40 years, Male  DOB: 12-May-1946  M:   __________________________________________________ Diabetes (DM) Review (HC) Chart Review A1C Reading #1 (last): 8.0 on: 08/21/2022 A1C Reading #2: 12.2 on: 05/17/2022 When was patient's last eye exam?: About a year ago  The patient's glycemic control has: Improved What recent interventions have been made by any provider to improve the patient's conditions in the last 3 months?: None. Has there been any documented recent hospitalizations or ED visits since last visit with Clinical Lead?: No Is the patient currently on a STATIN medication?: Yes Is the patient currently on ACE/ARB medication?: No Adherence Review Does the [facility] have access to medication refill data?: Yes Adherence rates for STAR metric medications: Glipizide ER 10 mg - 10/23/22 90 DS Atorvastatin 80 mg - 08/22/22 90 DS Adherence rates for medications indicated for disease state being reviewed: Glipizide ER 10 mg - 10/23/22 90 DS Does the patient have >5 day gap between last estimated fill dates for any of the above medications?: No Disease State Questions Able to connect with the Patient?: Yes Are you currently checking your blood sugars?: No Review recommendations from CPP of how often should be checking and encourage monitoring blood sugars.: Done Is the patient having any signs or symptoms of low blood sugars (breaking out in sweat, shaky, confusion, dizziness)?: No Is the patient checking their feet daily/regularly?: Yes Are there any cuts, swelling, blisters, redness, or any signs of infections?: No When was your last dentist appointment? (6 Month recommendation): About a two years ago  What diet changes have you made to improve your Blood Sugar level?: other Details: None.  What exercise are you doing to improve your Blood Sugar level?: no formal exercise Engagement Notes Gilbert Obrien on 10/30/2022 12:02 PM [facility] Chart Review: 8  MIN 10/30/22  HC Assessment call time spent: 7 min 10/30/22   Clinical Lead Review Review Adherence gaps identified?: No Drug Therapy Problems identified?: No Assessment: Uncontrolled Plan: A1C >7 Engagement Notes Gilbert Obrien on 12/07/2022 11:27 AM Reviewed : 32mns

## 2022-12-15 ENCOUNTER — Other Ambulatory Visit: Payer: Medicare HMO

## 2022-12-15 ENCOUNTER — Other Ambulatory Visit: Payer: Self-pay | Admitting: Nurse Practitioner

## 2022-12-15 DIAGNOSIS — E782 Mixed hyperlipidemia: Secondary | ICD-10-CM | POA: Diagnosis not present

## 2022-12-15 DIAGNOSIS — I10 Essential (primary) hypertension: Secondary | ICD-10-CM | POA: Diagnosis not present

## 2022-12-15 DIAGNOSIS — N189 Chronic kidney disease, unspecified: Secondary | ICD-10-CM | POA: Diagnosis not present

## 2022-12-15 DIAGNOSIS — E1165 Type 2 diabetes mellitus with hyperglycemia: Secondary | ICD-10-CM | POA: Diagnosis not present

## 2022-12-16 LAB — CBC WITH DIFFERENTIAL/PLATELET
Basophils Absolute: 0.1 10*3/uL (ref 0.0–0.2)
Basos: 1 %
EOS (ABSOLUTE): 0.5 10*3/uL — ABNORMAL HIGH (ref 0.0–0.4)
Eos: 6 %
Hematocrit: 39.5 % (ref 37.5–51.0)
Hemoglobin: 13.5 g/dL (ref 13.0–17.7)
Immature Grans (Abs): 0.1 10*3/uL (ref 0.0–0.1)
Immature Granulocytes: 1 %
Lymphocytes Absolute: 1.9 10*3/uL (ref 0.7–3.1)
Lymphs: 24 %
MCH: 30.7 pg (ref 26.6–33.0)
MCHC: 34.2 g/dL (ref 31.5–35.7)
MCV: 90 fL (ref 79–97)
Monocytes Absolute: 0.6 10*3/uL (ref 0.1–0.9)
Monocytes: 8 %
Neutrophils Absolute: 4.7 10*3/uL (ref 1.4–7.0)
Neutrophils: 60 %
Platelets: 237 10*3/uL (ref 150–450)
RBC: 4.4 x10E6/uL (ref 4.14–5.80)
RDW: 12.4 % (ref 11.6–15.4)
WBC: 7.9 10*3/uL (ref 3.4–10.8)

## 2022-12-16 LAB — LIPID PANEL W/O CHOL/HDL RATIO
Cholesterol, Total: 128 mg/dL (ref 100–199)
HDL: 37 mg/dL — ABNORMAL LOW (ref >39)
LDL Chol Calc (NIH): 68 mg/dL (ref 0–99)
Triglycerides: 132 mg/dL (ref 0–149)
VLDL Cholesterol Cal: 23 mg/dL (ref 5–40)

## 2022-12-16 LAB — HGB A1C W/O EAG: Hgb A1c MFr Bld: 10.9 % — ABNORMAL HIGH (ref 4.8–5.6)

## 2022-12-16 LAB — COMPREHENSIVE METABOLIC PANEL
ALT: 32 IU/L (ref 0–44)
AST: 14 IU/L (ref 0–40)
Albumin/Globulin Ratio: 1.8 (ref 1.2–2.2)
Albumin: 4.2 g/dL (ref 3.8–4.8)
Alkaline Phosphatase: 117 IU/L (ref 44–121)
BUN/Creatinine Ratio: 11 (ref 10–24)
BUN: 16 mg/dL (ref 8–27)
Bilirubin Total: 0.9 mg/dL (ref 0.0–1.2)
CO2: 21 mmol/L (ref 20–29)
Calcium: 9.1 mg/dL (ref 8.6–10.2)
Chloride: 103 mmol/L (ref 96–106)
Creatinine, Ser: 1.45 mg/dL — ABNORMAL HIGH (ref 0.76–1.27)
Globulin, Total: 2.3 g/dL (ref 1.5–4.5)
Glucose: 277 mg/dL — ABNORMAL HIGH (ref 70–99)
Potassium: 4.1 mmol/L (ref 3.5–5.2)
Sodium: 140 mmol/L (ref 134–144)
Total Protein: 6.5 g/dL (ref 6.0–8.5)
eGFR: 50 mL/min/{1.73_m2} — ABNORMAL LOW (ref >59)

## 2022-12-16 LAB — TSH: TSH: 3.93 u[IU]/mL (ref 0.450–4.500)

## 2022-12-18 ENCOUNTER — Encounter: Payer: Self-pay | Admitting: Nurse Practitioner

## 2022-12-18 ENCOUNTER — Ambulatory Visit (INDEPENDENT_AMBULATORY_CARE_PROVIDER_SITE_OTHER): Payer: Medicare HMO | Admitting: Nurse Practitioner

## 2022-12-18 VITALS — BP 128/82 | HR 76 | Ht 67.0 in | Wt 180.8 lb

## 2022-12-18 DIAGNOSIS — E782 Mixed hyperlipidemia: Secondary | ICD-10-CM | POA: Diagnosis not present

## 2022-12-18 DIAGNOSIS — F330 Major depressive disorder, recurrent, mild: Secondary | ICD-10-CM

## 2022-12-18 DIAGNOSIS — E1122 Type 2 diabetes mellitus with diabetic chronic kidney disease: Secondary | ICD-10-CM | POA: Diagnosis not present

## 2022-12-18 DIAGNOSIS — I10 Essential (primary) hypertension: Secondary | ICD-10-CM | POA: Diagnosis not present

## 2022-12-18 DIAGNOSIS — N1831 Chronic kidney disease, stage 3a: Secondary | ICD-10-CM

## 2022-12-18 NOTE — Progress Notes (Signed)
Established Patient Office Visit  Subjective:  Patient ID: Gilbert Obrien, male    DOB: [birth year]  Age: 77 y.o. MRN: [id number]  Chief Complaint  Patient presents with   Follow-up    4 month follow up, review labs.    4 month follow up and lab results review today.  Recent A1c is at 10.9%.  eGFR is at 50.    No other concerns at this time.   Past Medical History:  Diagnosis Date   Arthritis    Chronic kidney disease    per patient has stage 3 kidney failure    Coronary artery disease    a. STEMI 03/2016 DESx1 to LCx, DES x 1 Mid LAD, DES x1 prox RCA   Depression    Diabetes mellitus without complication (HCC)    type 2   Dyspnea    History of acute inferior wall myocardial infarction 04/15/2016   inf-lat/post >> PCI with DES of LCx; staged PCI of LAD and RCA   Hypercholesteremia    Hypertension    Ischemic cardiomyopathy 04/19/2016   A. Inf-lat/post STEMI 7/17 >> b. Echo 04/17/16: Septal and post lateral HK, poor image quality, EF 35-40% // b. Echo 11/17: mild LVH, EF 50-55, inf-lat and ant-lat HK, Gr 1 DD, borderline dilated aortic root (37 mm), MAC   Leg pain    ABIs 3/19:  normal    Past Surgical History:  Procedure Laterality Date   CARDIAC CATHETERIZATION N/A 04/15/2016   Procedure: Left Heart Cath and Coronary Angiography;  Surgeon: Sherren Mocha, MD;  Location: [place] [facility];  Service: Cardiovascular;  Laterality: N/A;   CARDIAC CATHETERIZATION N/A 04/15/2016   Procedure: Coronary Stent Intervention;  Surgeon: Sherren Mocha, MD;  Location: [place] [facility];  Service: Cardiovascular;  Laterality: N/A;   CARDIAC CATHETERIZATION N/A 04/17/2016   Procedure: Coronary Stent Intervention;  Surgeon: Burnell Blanks, MD;  Location: [place] [facility];  Service: Cardiovascular;  Laterality: N/A;   CARDIAC CATHETERIZATION N/A 04/18/2016   Procedure: Coronary Stent Intervention;  Surgeon: Burnell Blanks, MD;  Location: [place] [facility];  Service:  Cardiovascular;  Laterality: N/A;   CARDIAC CATHETERIZATION N/A 04/18/2016   Procedure: Temporary Pacemaker;  Surgeon: Burnell Blanks, MD;  Location: [place] [facility];  Service: Cardiovascular;  Laterality: N/A;   CORONARY STENT PLACEMENT  04/17/2016    Severe stenosis proximal LAD, now s/p successful PTCA/DES x 1 proximal and mid LAD   COSMETIC SURGERY  1974   right side facial        SHOULDER SURGERY      Social History   Socioeconomic History   Marital status: Single    Spouse name: Not on file   Number of children: Not on file   Years of education: Not on file   Highest education level: Not on file  Occupational History   Not on file  Tobacco Use   Smoking status: Former    Types: Cigarettes    Quit date: 09/25/2000    Years since quitting: 22.2   Smokeless tobacco: Never   Tobacco comments:    every few days last one 2 days ago  Vaping Use   Vaping Use: Never used  Substance and Sexual Activity   Alcohol use: No   Drug use: Yes    Types: Marijuana   Sexual activity: Not on file  Other Topics Concern   Not on file  Social History Narrative   Not on file  Social Determinants of Health   Financial Resource Strain: Not on file  Food Insecurity: Not on file  Transportation Needs: Not on file  Physical Activity: Not on file  Stress: Not on file  Social Connections: Not on file  Intimate Partner Violence: Not on file    Family History  Problem Relation Age of Onset   Heart attack Brother    Heart disease Brother     Allergies  Allergen Reactions   Celecoxib Anaphylaxis    Swelling in throat.   Sulfa Antibiotics Nausea And Vomiting and Swelling    Review of Systems  Constitutional: Negative.   HENT: Negative.    Eyes: Negative.   Respiratory: Negative.    Cardiovascular: Negative.   Gastrointestinal: Negative.   Genitourinary: Negative.   Musculoskeletal: Negative.   Skin: Negative.   Neurological: Negative.   Endo/Heme/Allergies:  Negative.   Psychiatric/Behavioral: Negative.         Objective:   BP 128/82   Pulse 76   Ht 5\' 7"  (1.702 m)   Wt 180 lb 12.8 oz (82 kg)   SpO2 95%   BMI 28.32 kg/m   Vitals:   12/18/22 1305  BP: 128/82  Pulse: 76  Height: 5\' 7"  (1.702 m)  Weight: 180 lb 12.8 oz (82 kg)  SpO2: 95%  BMI (Calculated): 28.31    Physical Exam Vitals reviewed.  Constitutional:      Appearance: Normal appearance.  HENT:     Head: Normocephalic.     Nose: Nose normal.     Mouth/Throat:     Mouth: Mucous membranes are moist.  Eyes:     Pupils: Pupils are equal, round, and reactive to light.  Cardiovascular:     Rate and Rhythm: Normal rate and regular rhythm.  Pulmonary:     Effort: Pulmonary effort is normal.     Breath sounds: Normal breath sounds.  Abdominal:     General: Bowel sounds are normal.     Palpations: Abdomen is soft.  Musculoskeletal:        General: Normal range of motion.     Cervical back: Normal range of motion and neck supple.  Skin:    General: Skin is warm and dry.  Neurological:     Mental Status: He is alert and oriented to person, place, and time.  Psychiatric:        Mood and Affect: Mood normal.        Behavior: Behavior normal.      No results found for any visits on 12/18/22.  Recent Results (from the past 2160 hour(s))  CBC with Differential/Platelet     Status: Abnormal   Collection Time: 12/15/22  8:33 AM  Result Value Ref Range   WBC 7.9 3.4 - 10.8 x10E3/uL   RBC 4.40 4.14 - 5.80 x10E6/uL   Hemoglobin 13.5 13.0 - 17.7 g/dL   Hematocrit 39.5 37.5 - 51.0 %   MCV 90 79 - 97 fL   MCH 30.7 26.6 - 33.0 pg   MCHC 34.2 31.5 - 35.7 g/dL   RDW 12.4 11.6 - 15.4 %   Platelets 237 150 - 450 x10E3/uL   Neutrophils 60 Not Estab. %   Lymphs 24 Not Estab. %   Monocytes 8 Not Estab. %   Eos 6 Not Estab. %   Basos 1 Not Estab. %   Neutrophils Absolute 4.7 1.4 - 7.0 x10E3/uL   Lymphocytes Absolute 1.9 0.7 - 3.1 x10E3/uL   Monocytes Absolute 0.6 0.1  -  0.9 x10E3/uL   EOS (ABSOLUTE) 0.5 (H) 0.0 - 0.4 x10E3/uL   Basophils Absolute 0.1 0.0 - 0.2 x10E3/uL   Immature Granulocytes 1 Not Estab. %   Immature Grans (Abs) 0.1 0.0 - 0.1 x10E3/uL  Comprehensive metabolic panel     Status: Abnormal   Collection Time: 12/15/22  8:33 AM  Result Value Ref Range   Glucose 277 (H) 70 - 99 mg/dL   BUN 16 8 - 27 mg/dL   Creatinine, Ser 1.45 (H) 0.76 - 1.27 mg/dL   eGFR 50 (L) >59 mL/min/1.73   BUN/Creatinine Ratio 11 10 - 24   Sodium 140 134 - 144 mmol/L   Potassium 4.1 3.5 - 5.2 mmol/L   Chloride 103 96 - 106 mmol/L   CO2 21 20 - 29 mmol/L   Calcium 9.1 8.6 - 10.2 mg/dL   Total Protein 6.5 6.0 - 8.5 g/dL   Albumin 4.2 3.8 - 4.8 g/dL   Globulin, Total 2.3 1.5 - 4.5 g/dL   Albumin/Globulin Ratio 1.8 1.2 - 2.2   Bilirubin Total 0.9 0.0 - 1.2 mg/dL   Alkaline Phosphatase 117 44 - 121 IU/L   AST 14 0 - 40 IU/L   ALT 32 0 - 44 IU/L  Lipid Panel w/o Chol/HDL Ratio     Status: Abnormal   Collection Time: 12/15/22  8:33 AM  Result Value Ref Range   Cholesterol, Total 128 100 - 199 mg/dL   Triglycerides 132 0 - 149 mg/dL   HDL 37 (L) >39 mg/dL   VLDL Cholesterol Cal 23 5 - 40 mg/dL   LDL Chol Calc (NIH) 68 0 - 99 mg/dL  Hgb A1c w/o eAG     Status: Abnormal   Collection Time: 12/15/22  8:33 AM  Result Value Ref Range   Hgb A1c MFr Bld 10.9 (H) 4.8 - 5.6 %    Comment:          Prediabetes: 5.7 - 6.4          Diabetes: >6.4          Glycemic control for adults with diabetes: <7.0   TSH     Status: None   Collection Time: 12/15/22  8:33 AM  Result Value Ref Range   TSH 3.930 0.450 - 4.500 uIU/mL      Assessment & Plan:   Problem List Items Addressed This Visit       Cardiovascular and Mediastinum   Essential hypertension     Endocrine   Type II diabetes mellitus with renal manifestations (HCC) - Primary   Relevant Medications   glipiZIDE (GLUCOTROL XL) 10 MG 24 hr tablet     Other   HLD (hyperlipidemia)   Depression    Return in  about 3 months (around 03/20/2023), or please give patient directions for My Chart set up.   Total time spent: 35 minutes  Evern Bio, NP  12/18/2022

## 2022-12-18 NOTE — Patient Instructions (Addendum)
1) Follow up appt in 3 months, 2 weeks, fasting labs prior

## 2023-03-05 ENCOUNTER — Ambulatory Visit: Payer: Medicare HMO

## 2023-03-06 NOTE — Chronic Care Management (AMB) (Signed)
Clinical Summary Summary Next CCM Follow Up: F/U in 2 weeks about diet. Next AWV: 03/20/23 Summary for PCP:  - we discussed diet extensively and went through recipes that involved no cooking. Plan is to F/U in 10 days and assess feasibility and readiness for more.  Patient Chart Prep (HC) Chronic Conditions Patient's Chronic Conditions: Diabetes (DM), Hypertension (HTN), Hyperlipidemia/Dyslipidemia (HLD), Osteoarthritis Doctor and Hospital Visits Were there PCP Visits since last visit with the Pharmacist?: Yes PCP Visits details: 09/24/23 Welton Flakes (PCP)- Follow up. 11/23/22 Welton Flakes (PCP)- Follow up. 12/18/22 Orson Eva (PCP)- DM follow up. No medication changes noted. Were there Specialist Visits since last visit with the Pharmacist?: No Was there a Hospital Visit in last 30 days?: No Were there other Hospital Visits since last visit with the Pharmacist?: No Engagement Notes dimock, eugenia on 03/02/2023 11:32 AM Patient would like for this appointment to be a phone call please. dimock, eugenia on 02/27/2023 02:40 PM  Chart prep completed for CCM follow up, by Delaney Meigs, LPN, [facility] reviewing PCP notes, specialist notes, radiology notes, and reviewing lab results. Reviewed medications and fill histories.  The Chart Prep will be used as the base of CP note. The original chart prep is stored off site on a HIPPA secured site. Went over all pre-call questions with patient. CCM Billed Time: HC Chart Prep: 30 minutes (02/27/23)   Bostick, Ashlea on 11/06/2022 05:01 PM Rescheduled to 03/05/2023 @ 2pm Joycelyn Das on 08/16/2022 05:11 PM cp f/u visit 12/14/22 at 3:30pm via phone Pre-Call Questions [facility]) Pre-Call Questions Date:: 03/02/2023 Time:: AM Outcome:: Successful Confirmed appointment date/time with patient/caregiver?: Yes Date/time of the appointment: 03/05/23 @ 2pm Visit type: Phone Patient/Caregiver instructed to bring medications to appointment: Yes What, if any, problems do you  have getting your medications from the pharmacy?: None What is your top health concern to discuss at your upcoming visit?: Patient cannot use the Portal Have you seen any other providers since your last visit?: No Engagement Notes dimock, eugenia on 03/01/2023 01:01 PM LVM requesting patient return call Disease Assessments Subjective Information Visit Completed on: 03/05/2023 Subjective: Currently son is living with the patient. He just does whatever he likes to do. He has problem with skin cancer. He watches TV and reads some. Fixes meals are a problem. He doesn't cook - can make 2-3 things he can cook. He is tired of chicken Lifestyle habits: Diet: Coffee 3-4 cups, BF of Glucerna shake, pack of crackers, cookie or cake. He does not eat lunch. Supper is chicken. Snacks are nuts, nabs. Doesn't eat fruits and veggies. Sometimes may bring steamer bags.  No smoking or alcohol.  Sleep real late like at 2pm. He lets his dogs out around 1.30am and then sleeps. Wakes at 8.30am and sometimes take daytime naps. What is the patient's sleep pattern?: Trouble falling asleep SDOH: Accountable Health Communities Health-Related Social Needs Screening Tool ([employer]) SDOH questions were documented and reviewed (EMR or [employer]) within the past 12 months or since hospitalization?: No What is your living situation today? (ref #1): I have a steady place to live Think about the place you live. Do you have problems with any of the following? (ref #2): None of the above Within the past 12 months, you worried that your food would run out before you got money to buy more (ref #3): Never true Within the past 12 months, the food you bought just didn't last and you didn't have money to get more (ref #4): Never true In the past  12 months, has lack of reliable transportation kept you from medical appointments, meetings, work or from getting things needed for  daily living? (ref #5): No In the past 12 months, has the electric, gas, oil, or water company threatened to shut off services in your home? (ref #6): No How often does anyone, including family and friends, physically hurt you? (ref #7): Never (1) How often does anyone, including family and friends, insult or talk down to you? (ref #8): Never (1) How often does anyone, including friends and family, threaten you with harm? (ref #9): Never (1) How often does anyone, including family and friends, scream or curse at you? (ref #10): Never (1) Medication Adherence Does the [facility] have access to medication refill history?: No Is Patient using [facility] pharmacy?: No Name and location of Current pharmacy: [facility]  Current Rx insurance plan: [employer] Assessment:: Potentially Non-adherent Hypertension (HTN) Most Recent BP: 128/82 Most Recent HR: 76 taken on: 12/18/2022 Care Gap: Need BP documented or last BP 140/90 or higher: Addressed Assessed today?: Yes Goal: <130/80 mmHG Is Patient checking BP at home?: Yes Has patient experienced hypotension, dizziness, falls or bradycardia?: No We discussed: Contacting PCP office for signs and symptoms of high or low blood pressure (hypotension, dizziness, falls, headaches, edema), Proper Home BP Measurement Assessment:: Uncontrolled Drug: Amlodipine 10mg  qd Pharmacist Assessment: Appropriate, Effective, Safe, Accessible Drug: Carvedilol 25mg  BID Pharmacist Assessment: Appropriate, Effective, Safe, Accessible Drug: Ramipril 5mg  BID Plan/Follow up: Patient wants to try [employer] supplement Counseled patient about possible drops in BP, when taken in conjunction with BP medications. Hyperlipidemia/Dyslipidemia (HLD) Last Lipid panel on: 12/15/2022 TC (Goal<200): 128 LDL: 68 HDL (Goal>40): 37 TG (Goal<150): 132 ASCVD 10-year risk?is:: N/A due to lab values outside the range Assessed today?: No Drug: Atorvastatin 80mg  qd Diabetes (DM) Most  recent A1C: 10.9 taken on: 12/15/2022 Most Recent GFR: 50 taken on: 12/15/2022 Type: 2 Most recent microalbumin ratio: Not in EMR Care Gap: Statin therapy needed: Addressed Care Gap: Need A1c documented or last A1c > 9 %: Needs to be addressed Care Gap: Need eye exam documented in EMR or by claim: Patient excluded from population (Age > 75 Care Gap: Need eGFR and uACR for kidney health evaluation: Patient excluded from population (Age > 63 Assessed today?: Yes Goal A1C: < 7.0 % Type: 2 Is Patient taking statin medication: Yes Is patient taking ACEi / ARB?: No Reason patient is not taking ACEi / ARB: No recent MACR Blood glucose monitoring: Not monitoring Has patient experienced any hypoglycemic episodes?: not that he could recollect. We discussed: Low carbohydrate eating plan with an emphasis on whole grains, legumes, nuts, fruits, and vegetables and minimal refined and processed foods. Assessment:: Uncontrolled Drug: Glipizide XL 10mg  daily Pharmacist Assessment: Appropriate, Effective, Query Safety Plan/Follow up: Follow up - Patient reports he CANNOT cook and is interested in trying healthy options that do not involve much cooking. We discussed a bean salad recipe using canned beans and vegetables and salad dressing. Counsel: healthy eating with minimal cooking. Will F/U with the recipe in 2 weeks and suggest another recipe. Osteoarthritis Assessed today?: No Drug: None Preventative Health Care Gap: Colorectal cancer screening: Addressed Care Gap: Breast cancer screening: Patient excluded from population (Age > 75, hx of bilateral mastectomy, frailty, hospice services) Care Gap: Annual Wellness Visit (AWV): Needs to be addressed . Lynann Bologna, PharmD Chart review Televisit Documentation 

## 2023-03-16 ENCOUNTER — Other Ambulatory Visit: Payer: Medicare HMO

## 2023-03-16 DIAGNOSIS — I10 Essential (primary) hypertension: Secondary | ICD-10-CM | POA: Diagnosis not present

## 2023-03-16 DIAGNOSIS — N1831 Chronic kidney disease, stage 3a: Secondary | ICD-10-CM | POA: Diagnosis not present

## 2023-03-16 DIAGNOSIS — E1122 Type 2 diabetes mellitus with diabetic chronic kidney disease: Secondary | ICD-10-CM

## 2023-03-16 DIAGNOSIS — E782 Mixed hyperlipidemia: Secondary | ICD-10-CM

## 2023-03-16 DIAGNOSIS — L570 Actinic keratosis: Secondary | ICD-10-CM | POA: Diagnosis not present

## 2023-03-16 DIAGNOSIS — D044 Carcinoma in situ of skin of scalp and neck: Secondary | ICD-10-CM | POA: Diagnosis not present

## 2023-03-16 DIAGNOSIS — D485 Neoplasm of uncertain behavior of skin: Secondary | ICD-10-CM | POA: Diagnosis not present

## 2023-03-17 LAB — CBC WITH DIFFERENTIAL
Basophils Absolute: 0.1 10*3/uL (ref 0.0–0.2)
Basos: 1 %
EOS (ABSOLUTE): 0.3 10*3/uL (ref 0.0–0.4)
Eos: 5 %
Hematocrit: 38.1 % (ref 37.5–51.0)
Hemoglobin: 13 g/dL (ref 13.0–17.7)
Immature Grans (Abs): 0 10*3/uL (ref 0.0–0.1)
Immature Granulocytes: 1 %
Lymphocytes Absolute: 1.7 10*3/uL (ref 0.7–3.1)
Lymphs: 25 %
MCH: 29.7 pg (ref 26.6–33.0)
MCHC: 34.1 g/dL (ref 31.5–35.7)
MCV: 87 fL (ref 79–97)
Monocytes Absolute: 0.5 10*3/uL (ref 0.1–0.9)
Monocytes: 8 %
Neutrophils Absolute: 4.2 10*3/uL (ref 1.4–7.0)
Neutrophils: 60 %
RBC: 4.38 x10E6/uL (ref 4.14–5.80)
RDW: 13.4 % (ref 11.6–15.4)
WBC: 6.9 10*3/uL (ref 3.4–10.8)

## 2023-03-17 LAB — CMP14+EGFR
ALT: 28 IU/L (ref 0–44)
AST: 18 IU/L (ref 0–40)
Albumin: 3.9 g/dL (ref 3.8–4.8)
Alkaline Phosphatase: 95 IU/L (ref 44–121)
BUN/Creatinine Ratio: 13 (ref 10–24)
BUN: 19 mg/dL (ref 8–27)
Bilirubin Total: 0.9 mg/dL (ref 0.0–1.2)
CO2: 20 mmol/L (ref 20–29)
Calcium: 9.1 mg/dL (ref 8.6–10.2)
Chloride: 107 mmol/L — ABNORMAL HIGH (ref 96–106)
Creatinine, Ser: 1.52 mg/dL — ABNORMAL HIGH (ref 0.76–1.27)
Globulin, Total: 2.3 g/dL (ref 1.5–4.5)
Glucose: 227 mg/dL — ABNORMAL HIGH (ref 70–99)
Potassium: 3.6 mmol/L (ref 3.5–5.2)
Sodium: 143 mmol/L (ref 134–144)
Total Protein: 6.2 g/dL (ref 6.0–8.5)
eGFR: 47 mL/min/{1.73_m2} — ABNORMAL LOW (ref >59)

## 2023-03-17 LAB — LIPID PANEL
Chol/HDL Ratio: 3.1 ratio (ref 0.0–5.0)
Cholesterol, Total: 103 mg/dL (ref 100–199)
HDL: 33 mg/dL — ABNORMAL LOW (ref >39)
LDL Chol Calc (NIH): 52 mg/dL (ref 0–99)
Triglycerides: 92 mg/dL (ref 0–149)
VLDL Cholesterol Cal: 18 mg/dL (ref 5–40)

## 2023-03-17 LAB — HEMOGLOBIN A1C
Est. average glucose Bld gHb Est-mCnc: 194 mg/dL
Hgb A1c MFr Bld: 8.4 % — ABNORMAL HIGH (ref 4.8–5.6)

## 2023-03-17 LAB — TSH: TSH: 1.91 u[IU]/mL (ref 0.450–4.500)

## 2023-03-20 ENCOUNTER — Ambulatory Visit: Payer: Medicare HMO | Admitting: Nurse Practitioner

## 2023-04-09 ENCOUNTER — Other Ambulatory Visit: Payer: Self-pay | Admitting: Nurse Practitioner

## 2023-04-10 ENCOUNTER — Ambulatory Visit (INDEPENDENT_AMBULATORY_CARE_PROVIDER_SITE_OTHER): Payer: Medicare HMO | Admitting: Cardiology

## 2023-04-10 ENCOUNTER — Encounter: Payer: Self-pay | Admitting: Cardiology

## 2023-04-10 VITALS — BP 124/84 | HR 80 | Ht 66.0 in | Wt 174.2 lb

## 2023-04-10 DIAGNOSIS — E782 Mixed hyperlipidemia: Secondary | ICD-10-CM | POA: Diagnosis not present

## 2023-04-10 DIAGNOSIS — I10 Essential (primary) hypertension: Secondary | ICD-10-CM | POA: Diagnosis not present

## 2023-04-10 DIAGNOSIS — Z1211 Encounter for screening for malignant neoplasm of colon: Secondary | ICD-10-CM | POA: Diagnosis not present

## 2023-04-10 DIAGNOSIS — E1122 Type 2 diabetes mellitus with diabetic chronic kidney disease: Secondary | ICD-10-CM | POA: Diagnosis not present

## 2023-04-10 DIAGNOSIS — N1831 Chronic kidney disease, stage 3a: Secondary | ICD-10-CM | POA: Diagnosis not present

## 2023-04-10 DIAGNOSIS — Z125 Encounter for screening for malignant neoplasm of prostate: Secondary | ICD-10-CM | POA: Insufficient documentation

## 2023-04-10 NOTE — Progress Notes (Signed)
Established Patient Office Visit  Subjective:  Patient ID: Gilbert Obrien, male    DOB: [birth year]  Age: 77 y.o. MRN: [id number]  Chief Complaint  Patient presents with   Follow-up    3 month follow up    Patient in office for 3 month follow up, discuss recent lab work.  Patient overdue for colonoscopy, new order sent.  Recent lab work, discussed importance of staying hydrated to prevent further decrease in kidney function. Hgb A1c improving. Patient states he has made significant dietary changes, decrease sugar and starch intake.  States he does not think his Wellbutrin is working well, recommend increasing the dose, patient does not want to at this time.     No other concerns at this time.   Past Medical History:  Diagnosis Date   Abdominal pain    Acute cholecystitis 03/25/2020   Acute respiratory failure with hypoxia (HCC) 06/28/2020   Arthritis    Cholecystitis 08/15/2016   Chronic kidney disease    per patient has stage 3 kidney failure    Coronary artery disease    a. 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 Inf-lat/post STEMI 7/17 >> b. Echo 04/17/16: Septal and post lateral HK, poor image quality, EF 35-40% // b. Echo 11/17: mild LVH, EF 50-55, inf-lat and ant-lat HK, Gr 1 DD, borderline dilated aortic root (37 mm), MAC   Leg pain    ABIs 3/19:  normal   Weakness     Past Surgical History:  Procedure Laterality Date   CARDIAC CATHETERIZATION N/A 04/15/2016   Procedure: Left Heart Cath and Coronary Angiography;  Surgeon: Tonny Bollman, MD;  Location: [facility]  INVASIVE CV LAB;  Service: Cardiovascular;  Laterality: N/A;   CARDIAC CATHETERIZATION N/A 04/15/2016   Procedure: Coronary Stent Intervention;  Surgeon: Tonny Bollman, MD;  Location: [facility] INVASIVE CV LAB;  Service: Cardiovascular;  Laterality: N/A;   CARDIAC CATHETERIZATION N/A 04/17/2016   Procedure: Coronary Stent Intervention;  Surgeon: Kathleene Hazel, MD;  Location: [facility] INVASIVE CV LAB;  Service: Cardiovascular;  Laterality: N/A;   CARDIAC CATHETERIZATION N/A 04/18/2016   Procedure: Coronary Stent Intervention;  Surgeon: Kathleene Hazel, MD;  Location: [facility] INVASIVE CV LAB;  Service: Cardiovascular;  Laterality: N/A;   CARDIAC CATHETERIZATION N/A 04/18/2016   Procedure: Temporary Pacemaker;  Surgeon: Kathleene Hazel, MD;  Location: [facility] INVASIVE CV LAB;  Service: Cardiovascular;  Laterality: N/A;   CORONARY STENT PLACEMENT  04/17/2016    Severe stenosis proximal LAD, now s/p successful PTCA/DES x 1 proximal and mid LAD   COSMETIC SURGERY  1974   right side facial        SHOULDER SURGERY      Social History   Socioeconomic History   Marital status: Single    Spouse name: Not on file   Number of children: Not on file   Years of education: Not on file   Highest education level: Not on file  Occupational History   Not on file  Tobacco Use  Smoking status: Former    Current packs/day: 0.00    Types: Cigarettes    Quit date: 09/25/2000    Years since quitting: 22.5   Smokeless tobacco: Never   Tobacco comments:    every few days last one 2 days ago  Vaping Use   Vaping status: Never Used  Substance and Sexual Activity   Alcohol use: No   Drug use: Yes    Types: Marijuana   Sexual activity: Not on file  Other Topics Concern   Not on file  Social History Narrative   Not on file   Social Determinants of Health   Financial Resource Strain: Not on file  Food Insecurity: Not on file  Transportation Needs: Not on file  Physical Activity: Not on file  Stress:  Not on file  Social Connections: Not on file  Intimate Partner Violence: Not on file    Family History  Problem Relation Age of Onset   Heart attack Brother    Heart disease Brother     Allergies  Allergen Reactions   Celecoxib Anaphylaxis    Swelling in throat.   Sulfa Antibiotics Nausea And Vomiting and Swelling    Review of Systems  Constitutional: Negative.   HENT: Negative.    Eyes: Negative.   Respiratory: Negative.  Negative for shortness of breath.   Cardiovascular: Negative.  Negative for chest pain.  Gastrointestinal: Negative.  Negative for abdominal pain, constipation and diarrhea.  Genitourinary: Negative.   Musculoskeletal:  Negative for joint pain and myalgias.  Skin: Negative.   Neurological: Negative.  Negative for dizziness and headaches.  Endo/Heme/Allergies: Negative.   All other systems reviewed and are negative.      Objective:   BP 124/84   Pulse 80   Ht 5\' 6"  (1.676 m)   Wt 174 lb 3.2 oz (79 kg)   SpO2 98%   BMI 28.12 kg/m   Vitals:   04/10/23 1259  BP: 124/84  Pulse: 80  Height: 5\' 6"  (1.676 m)  Weight: 174 lb 3.2 oz (79 kg)  SpO2: 98%  BMI (Calculated): 28.13    Physical Exam Nursing note reviewed.  Constitutional:      Appearance: Normal appearance. He is normal weight.  HENT:     Head: Normocephalic and atraumatic.     Nose: Nose normal.     Mouth/Throat:     Mouth: Mucous membranes are moist.     Pharynx: Oropharynx is clear.  Eyes:     Extraocular Movements: Extraocular movements intact.     Conjunctiva/sclera: Conjunctivae normal.     Pupils: Pupils are equal, round, and reactive to light.  Cardiovascular:     Rate and Rhythm: Normal rate and regular rhythm.     Pulses: Normal pulses.     Heart sounds: Normal heart sounds.  Pulmonary:     Effort: Pulmonary effort is normal.     Breath sounds: Normal breath sounds.  Abdominal:     General: Abdomen is flat. Bowel sounds are normal.     Palpations: Abdomen is  soft.  Musculoskeletal:        General: Normal range of motion.     Cervical back: Normal range of motion.  Skin:    General: Skin is warm and dry.  Neurological:     General: No focal deficit present.     Mental Status: He is alert and oriented to person, place, and time.  Psychiatric:        Mood and Affect: Mood  normal.        Behavior: Behavior normal.        Thought Content: Thought content normal.        Judgment: Judgment normal.      No results found for any visits on 04/10/23.  Recent Results (from the past 2160 hour(s))  Hemoglobin A1c     Status: Abnormal   Collection Time: 03/16/23  9:24 AM  Result Value Ref Range   Hgb A1c MFr Bld 8.4 (H) 4.8 - 5.6 %    Comment:          Prediabetes: 5.7 - 6.4          Diabetes: >6.4          Glycemic control for adults with diabetes: <7.0    Est. average glucose Bld gHb Est-mCnc 194 mg/dL  TSH     Status: None   Collection Time: 03/16/23  9:24 AM  Result Value Ref Range   TSH 1.910 0.450 - 4.500 uIU/mL  CMP14+EGFR     Status: Abnormal   Collection Time: 03/16/23  9:24 AM  Result Value Ref Range   Glucose 227 (H) 70 - 99 mg/dL   BUN 19 8 - 27 mg/dL   Creatinine, Ser 6.01 (H) 0.76 - 1.27 mg/dL   eGFR 47 (L) >09 NA/TFT/7.32   BUN/Creatinine Ratio 13 10 - 24   Sodium 143 134 - 144 mmol/L   Potassium 3.6 3.5 - 5.2 mmol/L   Chloride 107 (H) 96 - 106 mmol/L   CO2 20 20 - 29 mmol/L   Calcium 9.1 8.6 - 10.2 mg/dL   Total Protein 6.2 6.0 - 8.5 g/dL   Albumin 3.9 3.8 - 4.8 g/dL   Globulin, Total 2.3 1.5 - 4.5 g/dL   Bilirubin Total 0.9 0.0 - 1.2 mg/dL   Alkaline Phosphatase 95 44 - 121 IU/L   AST 18 0 - 40 IU/L   ALT 28 0 - 44 IU/L  Lipid panel     Status: Abnormal   Collection Time: 03/16/23  9:24 AM  Result Value Ref Range   Cholesterol, Total 103 100 - 199 mg/dL   Triglycerides 92 0 - 149 mg/dL   HDL 33 (L) >20 mg/dL   VLDL Cholesterol Cal 18 5 - 40 mg/dL   LDL Chol Calc (NIH) 52 0 - 99 mg/dL   Chol/HDL Ratio 3.1  0.0 - 5.0 ratio    Comment:                                   T. Chol/HDL Ratio                                             Men  Women                               1/2 Avg.Risk  3.4    3.3                                   Avg.Risk  5.0    4.4  2X Avg.Risk  9.6    7.1                                3X Avg.Risk 23.4   11.0   CBC With Differential     Status: None   Collection Time: 03/16/23  9:24 AM  Result Value Ref Range   WBC 6.9 3.4 - 10.8 x10E3/uL   RBC 4.38 4.14 - 5.80 x10E6/uL   Hemoglobin 13.0 13.0 - 17.7 g/dL   Hematocrit 16.1 09.6 - 51.0 %   MCV 87 79 - 97 fL   MCH 29.7 26.6 - 33.0 pg   MCHC 34.1 31.5 - 35.7 g/dL   RDW 04.5 40.9 - 81.1 %   Neutrophils 60 Not Estab. %   Lymphs 25 Not Estab. %   Monocytes 8 Not Estab. %   Eos 5 Not Estab. %   Basos 1 Not Estab. %   Neutrophils Absolute 4.2 1.4 - 7.0 x10E3/uL   Lymphocytes Absolute 1.7 0.7 - 3.1 x10E3/uL   Monocytes Absolute 0.5 0.1 - 0.9 x10E3/uL   EOS (ABSOLUTE) 0.3 0.0 - 0.4 x10E3/uL   Basophils Absolute 0.1 0.0 - 0.2 x10E3/uL   Immature Granulocytes 1 Not Estab. %   Immature Grans (Abs) 0.0 0.0 - 0.1 x10E3/uL    Comment: **Effective April 23, 2023, profile [id number] CBC/Differential**   (No Platelet) will be made non-orderable. [facility] Offers:   [id number] CBC With Differential/Platelet       Assessment & Plan:  Order for colonoscopy sent in. Increase fluid intake. Continue to work on diet and exercise to lower Hgb A1c.  Consider increasing Wellbutrin.   Problem List Items Addressed This Visit       Cardiovascular and Mediastinum   Essential hypertension   Relevant Orders   CBC With Diff/Platelet   CMP14+EGFR   TSH     Endocrine   Type II diabetes mellitus with renal manifestations (HCC)   Relevant Orders   CBC With Diff/Platelet   CMP14+EGFR   TSH   Hemoglobin A1c     Other   HLD (hyperlipidemia)   Relevant Orders   CBC With Diff/Platelet   Lipid panel   TSH   Colon  cancer screening - Primary   Relevant Orders   Ambulatory referral to Gastroenterology    Return in about 4 months (around 08/11/2023) for with fasting blood work prior.   Total time spent: 25 minutes  [employer], NP  04/10/2023   This document may have been prepared by Dragon Voice Recognition software and as such may include unintentional dictation errors.

## 2023-05-01 DIAGNOSIS — L02423 Furuncle of right upper limb: Secondary | ICD-10-CM | POA: Diagnosis not present

## 2023-05-01 DIAGNOSIS — T7840XA Allergy, unspecified, initial encounter: Secondary | ICD-10-CM | POA: Diagnosis not present

## 2023-05-01 DIAGNOSIS — L03031 Cellulitis of right toe: Secondary | ICD-10-CM | POA: Diagnosis not present

## 2023-05-01 DIAGNOSIS — L0889 Other specified local infections of the skin and subcutaneous tissue: Secondary | ICD-10-CM | POA: Diagnosis not present

## 2023-05-02 DIAGNOSIS — L244 Irritant contact dermatitis due to drugs in contact with skin: Secondary | ICD-10-CM | POA: Diagnosis not present

## 2023-05-07 DIAGNOSIS — J019 Acute sinusitis, unspecified: Secondary | ICD-10-CM | POA: Diagnosis not present

## 2023-05-07 DIAGNOSIS — U071 COVID-19: Secondary | ICD-10-CM | POA: Diagnosis not present

## 2023-05-07 DIAGNOSIS — B9689 Other specified bacterial agents as the cause of diseases classified elsewhere: Secondary | ICD-10-CM | POA: Diagnosis not present

## 2023-05-07 DIAGNOSIS — Z03818 Encounter for observation for suspected exposure to other biological agents ruled out: Secondary | ICD-10-CM | POA: Diagnosis not present

## 2023-05-07 DIAGNOSIS — J209 Acute bronchitis, unspecified: Secondary | ICD-10-CM | POA: Diagnosis not present

## 2023-05-23 DIAGNOSIS — E119 Type 2 diabetes mellitus without complications: Secondary | ICD-10-CM | POA: Diagnosis not present

## 2023-05-23 DIAGNOSIS — B351 Tinea unguium: Secondary | ICD-10-CM | POA: Diagnosis not present

## 2023-05-23 DIAGNOSIS — L600 Ingrowing nail: Secondary | ICD-10-CM | POA: Diagnosis not present

## 2023-05-23 DIAGNOSIS — L03031 Cellulitis of right toe: Secondary | ICD-10-CM | POA: Diagnosis not present

## 2023-05-23 DIAGNOSIS — L851 Acquired keratosis [keratoderma] palmaris et plantaris: Secondary | ICD-10-CM | POA: Diagnosis not present

## 2023-05-31 ENCOUNTER — Encounter: Payer: Self-pay | Admitting: Cardiovascular Disease

## 2023-05-31 ENCOUNTER — Ambulatory Visit: Payer: Medicare HMO | Attending: Cardiovascular Disease | Admitting: Cardiovascular Disease

## 2023-05-31 VITALS — BP 146/68 | HR 62 | Ht 66.0 in | Wt 171.6 lb

## 2023-05-31 DIAGNOSIS — I5022 Chronic systolic (congestive) heart failure: Secondary | ICD-10-CM | POA: Diagnosis not present

## 2023-05-31 DIAGNOSIS — E782 Mixed hyperlipidemia: Secondary | ICD-10-CM | POA: Diagnosis not present

## 2023-05-31 DIAGNOSIS — I10 Essential (primary) hypertension: Secondary | ICD-10-CM

## 2023-05-31 DIAGNOSIS — N183 Chronic kidney disease, stage 3 unspecified: Secondary | ICD-10-CM | POA: Diagnosis not present

## 2023-05-31 DIAGNOSIS — I2510 Atherosclerotic heart disease of native coronary artery without angina pectoris: Secondary | ICD-10-CM | POA: Diagnosis not present

## 2023-05-31 MED ORDER — BLOOD PRESSURE KIT DEVI: 0 refills | Status: DC

## 2023-05-31 NOTE — Progress Notes (Signed)
Cardiology Office Note:    Date:  05/31/2023   ID:  Gilbert Obrien, DOB Nov 14, 1945, MRN [id number]  PCP:  Orson Eva, NP (Inactive)   [facility] Providers Cardiologist:  Tonny Bollman, MD     Referring MD: No ref. provider found   Chief Complaint  Patient presents with   Coronary Artery Disease    History of Present Illness:    Gilbert Obrien is a 77 y.o. 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  He was felt to be stable from a cardiovascular perspective.  An echocardiogram was performed because of his known ischemic heart disease and this demonstrated stable findings with an LVEF of 45 to 50% with no valvular disease. He is here alone today. Reports no cardiac concerns. Today, he denies symptoms of palpitations, chest pain, shortness of breath, orthopnea, PND, lower extremity edema, dizziness, or syncope. Reports home BP's range 120/s/75-80 mmHg.    Past Medical History:  Diagnosis Date   Abdominal pain    Acute cholecystitis 03/25/2020   Acute respiratory failure with hypoxia (HCC) 06/28/2020   Arthritis    Cholecystitis 08/15/2016   Chronic kidney disease    per patient has stage 3 kidney failure    Coronary artery disease    a. STEMI 03/2016 DESx1 to LCx, DES x 1 Mid LAD, DES x1 prox RCA   COVID-19 virus infection 06/24/2020   Depression    Diabetes mellitus without complication (HCC)    type 2   Dyspnea    Hematoma of arm 04/19/2016   History of acute inferior wall myocardial infarction 04/15/2016   inf-lat/post >>  PCI with DES of LCx; staged PCI of LAD and RCA   Hypercholesteremia    Hypertension    Hypokalemia 04/19/2016   Ischemic cardiomyopathy 04/19/2016   A. Inf-lat/post STEMI 7/17 >> b. Echo 04/17/16: Septal and post lateral HK, poor image quality, EF 35-40% // b. Echo 11/17: mild LVH, EF 50-55, inf-lat and ant-lat HK, Gr 1 DD, borderline dilated aortic root (37 mm), MAC   Leg pain    ABIs 3/19:  normal   Weakness     Past Surgical History:  Procedure Laterality Date   CARDIAC CATHETERIZATION N/A 04/15/2016   Procedure: Left Heart Cath and Coronary Angiography;  Surgeon: Tonny Bollman, MD;  Location: [facility] INVASIVE CV LAB;  Service: Cardiovascular;  Laterality: N/A;   CARDIAC CATHETERIZATION N/A 04/15/2016   Procedure: Coronary Stent Intervention;  Surgeon: Tonny Bollman, MD;  Location: [facility] INVASIVE CV LAB;  Service: Cardiovascular;  Laterality: N/A;   CARDIAC CATHETERIZATION N/A 04/17/2016   Procedure: Coronary Stent Intervention;  Surgeon: Kathleene Hazel, MD;  Location: [facility] INVASIVE CV LAB;  Service: Cardiovascular;  Laterality: N/A;   CARDIAC CATHETERIZATION N/A 04/18/2016   Procedure: Coronary Stent Intervention;  Surgeon: Kathleene Hazel, MD;  Location: [facility] INVASIVE CV LAB;  Service: Cardiovascular;  Laterality: N/A;   CARDIAC CATHETERIZATION N/A 04/18/2016   Procedure: Temporary Pacemaker;  Surgeon: Kathleene Hazel, MD;  Location: [facility] INVASIVE CV LAB;  Service: Cardiovascular;  Laterality: N/A;   CORONARY STENT PLACEMENT  04/17/2016    Severe stenosis proximal LAD, now s/p successful PTCA/DES x 1 proximal and mid LAD   COSMETIC SURGERY  1974   right side facial        SHOULDER SURGERY      Current Medications: Current Meds  Medication Sig   amLODipine (NORVASC) 10 MG tablet TAKE 1 TABLET EVERY DAY   aspirin EC 81 MG tablet Take 1 tablet (81 mg total) by mouth daily.   atorvastatin (LIPITOR) 80 MG tablet TAKE 1 TABLET EVERY NIGHT AT BEDTIME FOR CHOLESTEROL   Blood  Pressure Monitoring (BLOOD PRESSURE KIT) DEVI Please supply blood pressure cuff for pt to check BP daily   buPROPion (WELLBUTRIN XL) 150 MG 24 hr tablet TAKE 1 TABLET EVERY MORNING   carvedilol (COREG) 25 MG tablet TAKE 1 TABLET IN THE MORNING AND AT BEDTIME (PLEASE CALL TO SCHEDULE APPOINTMENT WITH MD FOR FURTHER REFILLS)   cholecalciferol (VITAMIN D3) 25 MCG (1000 UNIT) tablet Take 1,000 Units by mouth daily.   glipiZIDE (GLUCOTROL XL) 10 MG 24 hr tablet Take 10 mg by mouth daily.   Multiple Vitamins-Minerals (ONE-A-DAY MENS 50+) TABS Take 1 tablet by mouth daily.   ramipril (ALTACE) 5 MG capsule TAKE 1 CAPSULE TWICE DAILY   temazepam (RESTORIL) 15 MG capsule Take 15 mg by mouth at bedtime as needed for sleep.   Zinc 50 MG TABS Take 1 tablet by mouth daily.     Allergies:   Celecoxib and Sulfa antibiotics   Social History   Socioeconomic History   Marital status: Single    Spouse name: Not on file   Number of children: Not on file   Years of education: Not on file   Highest education level: Not on file  Occupational History   Not on file  Tobacco Use   Smoking status: Former    Current packs/day: 0.00    Types: Cigarettes    Quit date: 09/25/2000    Years since quitting: 22.6   Smokeless tobacco: Never   Tobacco comments:    every few days last one 2 days ago  Vaping Use   Vaping status: Never Used  Substance and Sexual Activity   Alcohol use: No   Drug use: Yes    Types: Marijuana   Sexual activity: Not on file  Other Topics Concern   Not on file  Social History Narrative   Not on file   Social Determinants of Health   Financial Resource Strain: Not on file  Food Insecurity: Not on file  Transportation Needs: Not on file  Physical Activity: Not on file  Stress: Not on file  Social Connections: Not on file     Family History: The patient's family history includes Heart attack in his brother; Heart disease in his brother.  ROS:   Please see the history of  present illness.    All other systems reviewed and are negative.  EKGs/Labs/Other Studies Reviewed:    The following studies were reviewed today: Echo 03/21/2022:  1. Left ventricular ejection fraction, by estimation, is 45 to 50%. The  left ventricle has mildly decreased function. The left ventricle  demonstrates global hypokinesis. There is mild left ventricular  hypertrophy. Left ventricular diastolic parameters  are consistent with Grade I diastolic dysfunction (impaired relaxation).  Elevated left atrial pressure. The average left ventricular global  longitudinal strain is -14.2 %. The global longitudinal strain is  abnormal.   2. Right ventricular systolic function is normal. The right ventricular  size  is normal. Tricuspid regurgitation signal is inadequate for assessing  PA pressure.   3. The mitral valve is degenerative. Mild mitral valve regurgitation. No  evidence of mitral stenosis.   4. The aortic valve is tricuspid. There is moderate calcification of the  aortic valve. There is severe thickening of the aortic valve. Aortic valve  regurgitation is not visualized. Aortic valve sclerosis/calcification is  present, without any evidence of   aortic stenosis.   5. Aortic dilatation noted. There is mild dilatation of the aortic root,  measuring 40 mm.   EKG Interpretation Date/Time:  Thursday May 31 2023 14:37:40 EDT Ventricular Rate:  62 PR Interval:  204 QRS Duration:  98 QT Interval:  416 QTC Calculation: 422 R Axis:   -2  Text Interpretation: Normal sinus rhythm Minimal voltage criteria for LVH, may be normal variant ( R in aVL ) Inferior infarct (cited on or before 24-Mar-2020) When compared with ECG of 24-Jun-2020 12:35, No significant change was found Confirmed by Tonny Bollman [phone]) on 05/31/2023 2:47:12 PM    Recent Labs: 12/15/2022: Platelets 237 03/16/2023: ALT 28; BUN 19; Creatinine, Ser 1.52; Hemoglobin 13.0; Potassium 3.6; Sodium 143; TSH 1.910   Recent Lipid Panel    Component Value Date/Time   CHOL 103 03/16/2023 0924   TRIG 92 03/16/2023 0924   HDL 33 (L) 03/16/2023 0924   CHOLHDL 3.1 03/16/2023 0924   CHOLHDL 3.7 10/04/2016 0634   VLDL 21 10/04/2016 0634   LDLCALC 52 03/16/2023 0924         Physical Exam:    VS:  BP (!) 146/68   Pulse 62   Ht 5\' 6"  (1.676 m)   Wt 171 lb 9.6 oz (77.8 kg)   SpO2 95%   BMI 27.70 kg/m     Wt Readings from Last 3 Encounters:  05/31/23 171 lb 9.6 oz (77.8 kg)  04/10/23 174 lb 3.2 oz (79 kg)  12/18/22 180 lb 12.8 oz (82 kg)     GEN:  Well nourished, well developed in no acute distress HEENT: Normal NECK: No JVD; No carotid bruits LYMPHATICS: No lymphadenopathy CARDIAC: RRR, no murmurs, rubs, gallops RESPIRATORY:  Clear to auscultation without rales, wheezing or rhonchi  ABDOMEN: Soft, non-tender, non-distended MUSCULOSKELETAL:  No edema; No deformity  SKIN: Warm and dry NEUROLOGIC:  Alert and oriented x 3 PSYCHIATRIC:  Normal affect   ASSESSMENT:    1. Heart failure with mildly reduced ejection fraction (HFmrEF) (HCC)   2. Coronary artery disease involving native coronary artery of native heart without angina pectoris   3. Stage 3 chronic kidney disease, unspecified whether stage 3a or 3b CKD (HCC)   4. Essential hypertension   5. Mixed hyperlipidemia    PLAN:    In order of problems listed above:  The patient appears clinically stable with NYHA functional class I symptoms on carvedilol and ramipril.  He has not required diuretic therapy and demonstrates no congestive symptoms or evidence of volume overload on exam.  LVEF 1 year ago is 45 to 50%.  This is stable from prior echo studies. Doing well on aspirin for antiplatelet therapy and a high intensity statin drug with no angina. Most recent labs reviewed with a creatinine of 1.52 and potassium of 3.6.  Continue ACE inhibitor. Blood pressure controlled based on home readings.  Continue current management which includes  amlodipine, carvedilol, and ramipril. Treated with atorvastatin 80 mg daily.  Recent labs reviewed with an LDL cholesterol of 52.     Medication  Adjustments/Labs and Tests Ordered: Current medicines are reviewed at length with the patient today.  Concerns regarding medicines are outlined above.  Orders Placed This Encounter  Procedures   EKG 12-Lead   Meds ordered this encounter  Medications   Blood Pressure Monitoring (BLOOD PRESSURE KIT) DEVI    Sig: Please supply blood pressure cuff for pt to check BP daily    Dispense:  1 each    Refill:  0    Please fill [employer], or covered brand per [facility] Medicare    Patient Instructions  Medication Instructions:  Your physician recommends that you continue on your current medications as directed. Please refer to the Current Medication list given to you today.  *If you need a refill on your cardiac medications before your next appointment, please call your pharmacy*   Lab Work: NONE If you have labs (blood work) drawn today and your tests are completely normal, you will receive your results only by: MyChart Message (if you have MyChart) OR A paper copy in the mail If you have any lab test that is abnormal or we need to change your treatment, we will call you to review the results.   Testing/Procedures: NONE   Follow-Up: At [facility], you and your health needs are our priority.  As part of our continuing mission to provide you with exceptional heart care, we have created designated Provider Care Teams.  These Care Teams include your primary Cardiologist (physician) and Advanced Practice Providers (APPs -  Physician Assistants and Nurse Practitioners) who all work together to provide you with the care you need, when you need it.  We recommend signing up for the patient portal called "MyChart".  Sign up information is provided on this After Visit Summary.  MyChart is used to connect with patients for Virtual Visits  (Telemedicine).  Patients are able to view lab/test results, encounter notes, upcoming appointments, etc.  Non-urgent messages can be sent to your provider as well.   To learn more about what you can do with MyChart, go to [email or web address].    Your next appointment:   1 year(s)  Provider:   Tonny Bollman, MD        Signed, Tonny Bollman, MD  05/31/2023 4:59 PM    [facility]

## 2023-05-31 NOTE — Patient Instructions (Signed)

## 2023-08-08 DIAGNOSIS — E119 Type 2 diabetes mellitus without complications: Secondary | ICD-10-CM | POA: Diagnosis not present

## 2023-08-13 ENCOUNTER — Ambulatory Visit: Payer: Medicare HMO | Admitting: Cardiology

## 2023-08-21 DIAGNOSIS — I2510 Atherosclerotic heart disease of native coronary artery without angina pectoris: Secondary | ICD-10-CM | POA: Diagnosis not present

## 2023-08-21 DIAGNOSIS — Z1211 Encounter for screening for malignant neoplasm of colon: Secondary | ICD-10-CM | POA: Diagnosis not present

## 2023-08-21 DIAGNOSIS — E119 Type 2 diabetes mellitus without complications: Secondary | ICD-10-CM | POA: Diagnosis not present

## 2023-08-30 DIAGNOSIS — H25813 Combined forms of age-related cataract, bilateral: Secondary | ICD-10-CM | POA: Diagnosis not present

## 2023-09-04 ENCOUNTER — Other Ambulatory Visit: Payer: Medicare HMO

## 2023-09-04 DIAGNOSIS — E782 Mixed hyperlipidemia: Secondary | ICD-10-CM | POA: Diagnosis not present

## 2023-09-04 DIAGNOSIS — N1831 Chronic kidney disease, stage 3a: Secondary | ICD-10-CM | POA: Diagnosis not present

## 2023-09-04 DIAGNOSIS — I10 Essential (primary) hypertension: Secondary | ICD-10-CM

## 2023-09-04 DIAGNOSIS — Z125 Encounter for screening for malignant neoplasm of prostate: Secondary | ICD-10-CM | POA: Diagnosis not present

## 2023-09-04 DIAGNOSIS — E1122 Type 2 diabetes mellitus with diabetic chronic kidney disease: Secondary | ICD-10-CM | POA: Diagnosis not present

## 2023-09-05 LAB — CMP14+EGFR
ALT: 25 [IU]/L (ref 0–44)
AST: 15 [IU]/L (ref 0–40)
Albumin: 3.8 g/dL (ref 3.8–4.8)
Alkaline Phosphatase: 97 [IU]/L (ref 44–121)
BUN/Creatinine Ratio: 14 (ref 10–24)
BUN: 18 mg/dL (ref 8–27)
Bilirubin Total: 0.5 mg/dL (ref 0.0–1.2)
CO2: 23 mmol/L (ref 20–29)
Calcium: 9.2 mg/dL (ref 8.6–10.2)
Chloride: 107 mmol/L — ABNORMAL HIGH (ref 96–106)
Creatinine, Ser: 1.26 mg/dL (ref 0.76–1.27)
Globulin, Total: 2.4 g/dL (ref 1.5–4.5)
Glucose: 255 mg/dL — ABNORMAL HIGH (ref 70–99)
Potassium: 4 mmol/L (ref 3.5–5.2)
Sodium: 144 mmol/L (ref 134–144)
Total Protein: 6.2 g/dL (ref 6.0–8.5)
eGFR: 58 mL/min/{1.73_m2} — ABNORMAL LOW (ref >59)

## 2023-09-05 LAB — CBC WITH DIFF/PLATELET
Basophils Absolute: 0.1 10*3/uL (ref 0.0–0.2)
Basos: 1 %
EOS (ABSOLUTE): 0.7 10*3/uL — ABNORMAL HIGH (ref 0.0–0.4)
Eos: 9 %
Hematocrit: 39.5 % (ref 37.5–51.0)
Hemoglobin: 13.1 g/dL (ref 13.0–17.7)
Immature Grans (Abs): 0.1 10*3/uL (ref 0.0–0.1)
Immature Granulocytes: 2 %
Lymphocytes Absolute: 1.8 10*3/uL (ref 0.7–3.1)
Lymphs: 22 %
MCH: 29.9 pg (ref 26.6–33.0)
MCHC: 33.2 g/dL (ref 31.5–35.7)
MCV: 90 fL (ref 79–97)
Monocytes Absolute: 0.7 10*3/uL (ref 0.1–0.9)
Monocytes: 9 %
Neutrophils Absolute: 4.5 10*3/uL (ref 1.4–7.0)
Neutrophils: 57 %
Platelets: 203 10*3/uL (ref 150–450)
RBC: 4.38 x10E6/uL (ref 4.14–5.80)
RDW: 13.1 % (ref 11.6–15.4)
WBC: 7.9 10*3/uL (ref 3.4–10.8)

## 2023-09-05 LAB — LIPID PANEL
Chol/HDL Ratio: 3.7 {ratio} (ref 0.0–5.0)
Cholesterol, Total: 126 mg/dL (ref 100–199)
HDL: 34 mg/dL — ABNORMAL LOW (ref >39)
LDL Chol Calc (NIH): 69 mg/dL (ref 0–99)
Triglycerides: 130 mg/dL (ref 0–149)
VLDL Cholesterol Cal: 23 mg/dL (ref 5–40)

## 2023-09-05 LAB — HEMOGLOBIN A1C
Est. average glucose Bld gHb Est-mCnc: 223 mg/dL
Hgb A1c MFr Bld: 9.4 % — ABNORMAL HIGH (ref 4.8–5.6)

## 2023-09-05 LAB — TSH: TSH: 2.14 u[IU]/mL (ref 0.450–4.500)

## 2023-09-06 ENCOUNTER — Ambulatory Visit: Payer: Medicare HMO | Admitting: Cardiology

## 2023-09-06 ENCOUNTER — Encounter: Payer: Self-pay | Admitting: Cardiology

## 2023-09-06 VITALS — BP 154/76 | HR 71 | Ht 66.0 in | Wt 171.4 lb

## 2023-09-06 DIAGNOSIS — I10 Essential (primary) hypertension: Secondary | ICD-10-CM

## 2023-09-06 DIAGNOSIS — Z125 Encounter for screening for malignant neoplasm of prostate: Secondary | ICD-10-CM | POA: Diagnosis not present

## 2023-09-06 DIAGNOSIS — N1831 Chronic kidney disease, stage 3a: Secondary | ICD-10-CM | POA: Diagnosis not present

## 2023-09-06 DIAGNOSIS — E1122 Type 2 diabetes mellitus with diabetic chronic kidney disease: Secondary | ICD-10-CM

## 2023-09-06 DIAGNOSIS — E782 Mixed hyperlipidemia: Secondary | ICD-10-CM | POA: Diagnosis not present

## 2023-09-06 MED ORDER — EMPAGLIFLOZIN 25 MG PO TABS: 25.0000 mg | ORAL_TABLET | Freq: Every day | ORAL | 4 refills | Status: DC

## 2023-09-06 NOTE — Progress Notes (Signed)
Established Patient Office Visit  Subjective:  Patient ID: Gilbert Obrien, male    DOB: 1945/10/18  Age: 77 y.o. MRN: [id number]  Chief Complaint  Patient presents with   Follow-up    4 month follow up    Patient in office for 4 month follow up, discuss recent lab work. Patient feeling well over all. States he is urinating frequently with interrupted stream. Will add a PSA to recent lab work. Discussed recent lab work. LDL at goal. HgbA1c elevated. States he is compliant with glipizide but has not been eating well. Will add Jardiance.  Scheduled for colonoscopy .  Had DEE, schedule for cataract extraction.     No other concerns at this time.   Past Medical History:  Diagnosis Date   Abdominal pain    Acute cholecystitis 03/25/2020   Acute respiratory failure with hypoxia (HCC) 06/28/2020   Arthritis    Cholecystitis 08/15/2016   Chronic kidney disease    per patient has stage 3 kidney failure    Coronary artery disease    a. 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 Inf-lat/post STEMI 7/17 >> b. Echo 04/17/16: Septal and post lateral HK, poor image quality, EF 35-40% // b. Echo 11/17: mild LVH, EF 50-55, inf-lat and ant-lat HK, Gr 1 DD, borderline dilated aortic root (37 mm), MAC   Leg pain    ABIs 3/19:  normal   Weakness     Past Surgical History:  Procedure Laterality Date   CARDIAC CATHETERIZATION N/A 04/15/2016   Procedure: Left Heart Cath and Coronary Angiography;  Surgeon: Tonny Bollman, MD;  Location: [facility] INVASIVE CV LAB;  Service: Cardiovascular;   Laterality: N/A;   CARDIAC CATHETERIZATION N/A 04/15/2016   Procedure: Coronary Stent Intervention;  Surgeon: Tonny Bollman, MD;  Location: [facility] INVASIVE CV LAB;  Service: Cardiovascular;  Laterality: N/A;   CARDIAC CATHETERIZATION N/A 04/17/2016   Procedure: Coronary Stent Intervention;  Surgeon: Kathleene Hazel, MD;  Location: [facility] INVASIVE CV LAB;  Service: Cardiovascular;  Laterality: N/A;   CARDIAC CATHETERIZATION N/A 04/18/2016   Procedure: Coronary Stent Intervention;  Surgeon: Kathleene Hazel, MD;  Location: [facility] INVASIVE CV LAB;  Service: Cardiovascular;  Laterality: N/A;   CARDIAC CATHETERIZATION N/A 04/18/2016   Procedure: Temporary Pacemaker;  Surgeon: Kathleene Hazel, MD;  Location: [facility] INVASIVE CV LAB;  Service: Cardiovascular;  Laterality: N/A;   CORONARY STENT PLACEMENT  04/17/2016    Severe stenosis proximal LAD, now s/p successful PTCA/DES x 1 proximal and mid LAD   COSMETIC SURGERY  1974   right side facial        SHOULDER SURGERY      Social History   Socioeconomic History   Marital status: Single    Spouse name: Not on file   Number of children: Not on file   Years of education: Not on file   Highest education level: Not on file  Occupational History   Not on file  Tobacco Use   Smoking status: Former  Current packs/day: 0.00    Types: Cigarettes    Quit date: 09/25/2000    Years since quitting: 22.9   Smokeless tobacco: Never   Tobacco comments:    every few days last one 2 days ago  Vaping Use   Vaping status: Never Used  Substance and Sexual Activity   Alcohol use: No   Drug use: Yes    Types: Marijuana   Sexual activity: Not on file  Other Topics Concern   Not on file  Social History Narrative   Not on file   Social Drivers of Health   Financial Resource Strain: Medium Risk (08/21/2023)   Received from [facility]   Overall Financial Resource Strain (CARDIA)    Difficulty of Paying Living Expenses: Somewhat hard   Food Insecurity: Food Insecurity Present (08/21/2023)   Received from [facility]   Hunger Vital Sign    Worried About Running Out of Food in the Last Year: Sometimes true    Ran Out of Food in the Last Year: Never true  Transportation Needs: No Transportation Needs (08/21/2023)   Received from [facility] - Transportation    In the past 12 months, has lack of transportation kept you from medical appointments or from getting medications?: No    Lack of Transportation (Non-Medical): No  Physical Activity: Not on file  Stress: Not on file  Social Connections: Not on file  Intimate Partner Violence: Not on file    Family History  Problem Relation Age of Onset   Heart attack Brother    Heart disease Brother     Allergies  Allergen Reactions   Celecoxib Anaphylaxis    Swelling in throat.   Sulfa Antibiotics Nausea And Vomiting and Swelling    Outpatient Medications Prior to Visit  Medication Sig   amLODipine (NORVASC) 10 MG tablet TAKE 1 TABLET EVERY DAY   aspirin EC 81 MG tablet Take 1 tablet (81 mg total) by mouth daily.   atorvastatin (LIPITOR) 80 MG tablet TAKE 1 TABLET EVERY NIGHT AT BEDTIME FOR CHOLESTEROL   buPROPion (WELLBUTRIN XL) 150 MG 24 hr tablet TAKE 1 TABLET EVERY MORNING   carvedilol (COREG) 25 MG tablet TAKE 1 TABLET IN THE MORNING AND AT BEDTIME (PLEASE CALL TO SCHEDULE APPOINTMENT WITH MD FOR FURTHER REFILLS)   cholecalciferol (VITAMIN D3) 25 MCG (1000 UNIT) tablet Take 1,000 Units by mouth daily.   glipiZIDE (GLUCOTROL XL) 10 MG 24 hr tablet Take 10 mg by mouth daily.   Multiple Vitamins-Minerals (ONE-A-DAY MENS 50+) TABS Take 1 tablet by mouth daily.   ramipril (ALTACE) 5 MG capsule TAKE 1 CAPSULE TWICE DAILY   temazepam (RESTORIL) 15 MG capsule Take 15 mg by mouth at bedtime as needed for sleep.   Zinc 50 MG TABS Take 1 tablet by mouth daily.   [DISCONTINUED] Blood Pressure Monitoring (BLOOD PRESSURE KIT) DEVI  Please supply blood pressure cuff for pt to check BP daily (Patient not taking: Reported on 09/06/2023)   No facility-administered medications prior to visit.    Review of Systems  Constitutional: Negative.   HENT: Negative.    Eyes: Negative.   Respiratory: Negative.  Negative for shortness of breath.   Cardiovascular: Negative.  Negative for chest pain.  Gastrointestinal: Negative.  Negative for abdominal pain, constipation and diarrhea.  Genitourinary: Negative.   Musculoskeletal:  Negative for joint pain and myalgias.  Skin: Negative.   Neurological: Negative.  Negative for dizziness and headaches.  Endo/Heme/Allergies: Negative.   All other systems reviewed and are negative.      Objective:   BP (!) 154/76   Pulse 71   Ht 5\' 6"  (1.676 m)   Wt 171 lb 6.4 oz (77.7 kg)   SpO2 97%   BMI 27.66 kg/m   Vitals:   09/06/23 1304  BP: (!) 154/76  Pulse: 71  Height: 5\' 6"  (1.676 m)  Weight: 171 lb 6.4 oz (77.7 kg)  SpO2: 97%  BMI (Calculated): 27.68    Physical Exam Nursing note reviewed.  Constitutional:      Appearance: Normal appearance. He is normal weight.  HENT:     Head: Normocephalic and atraumatic.     Nose: Nose normal.     Mouth/Throat:     Mouth: Mucous membranes are moist.     Pharynx: Oropharynx is clear.  Eyes:     Extraocular Movements: Extraocular movements intact.     Conjunctiva/sclera: Conjunctivae normal.     Pupils: Pupils are equal, round, and reactive to light.  Cardiovascular:     Rate and Rhythm: Normal rate and regular rhythm.     Pulses: Normal pulses.     Heart sounds: Normal heart sounds.  Pulmonary:     Effort: Pulmonary effort is normal.     Breath sounds: Normal breath sounds.  Abdominal:     General: Abdomen is flat. Bowel sounds are normal.     Palpations: Abdomen is soft.  Musculoskeletal:        General: Normal range of motion.     Cervical back: Normal range of motion.  Skin:    General: Skin is warm and dry.   Neurological:     General: No focal deficit present.     Mental Status: He is alert and oriented to person, place, and time.  Psychiatric:        Mood and Affect: Mood normal.        Behavior: Behavior normal.        Thought Content: Thought content normal.        Judgment: Judgment normal.      No results found for any visits on 09/06/23.  Recent Results (from the past 2160 hours)  Hemoglobin A1c     Status: Abnormal   Collection Time: 09/04/23  8:33 AM  Result Value Ref Range   Hgb A1c MFr Bld 9.4 (H) 4.8 - 5.6 %    Comment:          Prediabetes: 5.7 - 6.4          Diabetes: >6.4          Glycemic control for adults with diabetes: <7.0    Est. average glucose Bld gHb Est-mCnc 223 mg/dL  TSH     Status: None   Collection Time: 09/04/23  8:33 AM  Result Value Ref Range   TSH 2.140 0.450 - 4.500 uIU/mL  CMP14+EGFR     Status: Abnormal   Collection Time: 09/04/23  8:33 AM  Result Value Ref Range   Glucose 255 (H) 70 - 99 mg/dL   BUN 18 8 - 27 mg/dL   Creatinine, Ser 1.61 0.76 - 1.27 mg/dL   eGFR 58 (L) >09 UE/AVW/0.98   BUN/Creatinine Ratio 14 10 - 24   Sodium 144 134 - 144 mmol/L   Potassium 4.0 3.5 - 5.2 mmol/L   Chloride 107 (H) 96 - 106 mmol/L   CO2 23 20 - 29 mmol/L   Calcium 9.2 8.6 - 10.2  mg/dL   Total Protein 6.2 6.0 - 8.5 g/dL   Albumin 3.8 3.8 - 4.8 g/dL   Globulin, Total 2.4 1.5 - 4.5 g/dL   Bilirubin Total 0.5 0.0 - 1.2 mg/dL   Alkaline Phosphatase 97 44 - 121 IU/L   AST 15 0 - 40 IU/L   ALT 25 0 - 44 IU/L  Lipid panel     Status: Abnormal   Collection Time: 09/04/23  8:33 AM  Result Value Ref Range   Cholesterol, Total 126 100 - 199 mg/dL   Triglycerides 536 0 - 149 mg/dL   HDL 34 (L) >64 mg/dL   VLDL Cholesterol Cal 23 5 - 40 mg/dL   LDL Chol Calc (NIH) 69 0 - 99 mg/dL   Chol/HDL Ratio 3.7 0.0 - 5.0 ratio    Comment:                                   T. Chol/HDL Ratio                                             Men  Women                                1/2 Avg.Risk  3.4    3.3                                   Avg.Risk  5.0    4.4                                2X Avg.Risk  9.6    7.1                                3X Avg.Risk 23.4   11.0   CBC With Diff/Platelet     Status: Abnormal   Collection Time: 09/04/23  8:33 AM  Result Value Ref Range   WBC 7.9 3.4 - 10.8 x10E3/uL   RBC 4.38 4.14 - 5.80 x10E6/uL   Hemoglobin 13.1 13.0 - 17.7 g/dL   Hematocrit 40.3 47.4 - 51.0 %   MCV 90 79 - 97 fL   MCH 29.9 26.6 - 33.0 pg   MCHC 33.2 31.5 - 35.7 g/dL   RDW 25.9 56.3 - 87.5 %   Platelets 203 150 - 450 x10E3/uL   Neutrophils 57 Not Estab. %   Lymphs 22 Not Estab. %   Monocytes 9 Not Estab. %   Eos 9 Not Estab. %   Basos 1 Not Estab. %   Neutrophils Absolute 4.5 1.4 - 7.0 x10E3/uL   Lymphocytes Absolute 1.8 0.7 - 3.1 x10E3/uL   Monocytes Absolute 0.7 0.1 - 0.9 x10E3/uL   EOS (ABSOLUTE) 0.7 (H) 0.0 - 0.4 x10E3/uL   Basophils Absolute 0.1 0.0 - 0.2 x10E3/uL   Immature Granulocytes 2 Not Estab. %   Immature Grans (Abs) 0.1 0.0 - 0.1 x10E3/uL      Assessment & Plan:  Will add Jardiance.  Follow a strict diabetic diet. Handout given to patient.  Problem List Items Addressed This Visit       Cardiovascular and Mediastinum   Essential hypertension - Primary     Endocrine   Type II diabetes mellitus with renal manifestations (HCC)   Relevant Medications   empagliflozin (JARDIANCE) 25 MG TABS tablet     Other   HLD (hyperlipidemia)    Return in about 4 weeks (around 10/04/2023).   Total time spent: 25 minutes  [employer], NP  09/06/2023   This document may have been prepared by Dragon Voice Recognition software and as such may include unintentional dictation errors.

## 2023-09-07 LAB — PSA: Prostate Specific Ag, Serum: 1.5 ng/mL (ref 0.0–4.0)

## 2023-09-07 LAB — SPECIMEN STATUS REPORT

## 2023-09-10 ENCOUNTER — Telehealth: Payer: Self-pay | Admitting: Cardiology

## 2023-09-10 NOTE — Telephone Encounter (Signed)
Patient left VM requesting a call back today.

## 2023-09-11 ENCOUNTER — Other Ambulatory Visit: Payer: Self-pay

## 2023-09-11 MED ORDER — ZINC 50 MG PO TABS: 1.0000 | ORAL_TABLET | Freq: Every day | ORAL | 0 refills | Status: AC

## 2023-09-14 DIAGNOSIS — I2510 Atherosclerotic heart disease of native coronary artery without angina pectoris: Secondary | ICD-10-CM | POA: Diagnosis not present

## 2023-09-14 DIAGNOSIS — H25812 Combined forms of age-related cataract, left eye: Secondary | ICD-10-CM | POA: Diagnosis not present

## 2023-09-14 DIAGNOSIS — E1136 Type 2 diabetes mellitus with diabetic cataract: Secondary | ICD-10-CM | POA: Diagnosis not present

## 2023-09-14 DIAGNOSIS — F418 Other specified anxiety disorders: Secondary | ICD-10-CM | POA: Diagnosis not present

## 2023-09-24 ENCOUNTER — Other Ambulatory Visit (HOSPITAL_COMMUNITY): Payer: Self-pay

## 2023-09-25 ENCOUNTER — Other Ambulatory Visit (HOSPITAL_COMMUNITY): Payer: Self-pay

## 2023-10-02 ENCOUNTER — Ambulatory Visit: Payer: Medicare HMO | Admitting: Cardiology

## 2023-10-03 ENCOUNTER — Other Ambulatory Visit: Payer: Self-pay

## 2023-10-03 ENCOUNTER — Other Ambulatory Visit: Payer: Self-pay | Admitting: Cardiology

## 2023-10-03 NOTE — Progress Notes (Signed)
 Opened in error

## 2023-10-04 MED ORDER — TEMAZEPAM 15 MG PO CAPS: 15.0000 mg | ORAL_CAPSULE | Freq: Every evening | ORAL | 0 refills | Status: DC | PRN

## 2023-10-24 ENCOUNTER — Encounter: Payer: Self-pay | Admitting: Internal Medicine

## 2023-10-31 ENCOUNTER — Encounter
Admission: RE | Disposition: A | Discharge status: Home / Self Care | Payer: Self-pay | Source: Home / Self Care | Attending: Internal Medicine

## 2023-10-31 ENCOUNTER — Ambulatory Visit: Payer: PPO | Admitting: Anesthesiology

## 2023-10-31 ENCOUNTER — Ambulatory Visit
Admission: RE | Admit: 2023-10-31 | Discharge: 2023-10-31 | Disposition: A | Discharge status: Home / Self Care | Payer: PPO | Attending: Internal Medicine | Admitting: Internal Medicine

## 2023-10-31 DIAGNOSIS — E1122 Type 2 diabetes mellitus with diabetic chronic kidney disease: Secondary | ICD-10-CM | POA: Diagnosis not present

## 2023-10-31 DIAGNOSIS — I2510 Atherosclerotic heart disease of native coronary artery without angina pectoris: Secondary | ICD-10-CM | POA: Diagnosis not present

## 2023-10-31 DIAGNOSIS — Z7982 Long term (current) use of aspirin: Secondary | ICD-10-CM | POA: Insufficient documentation

## 2023-10-31 DIAGNOSIS — K640 First degree hemorrhoids: Secondary | ICD-10-CM | POA: Insufficient documentation

## 2023-10-31 DIAGNOSIS — I129 Hypertensive chronic kidney disease with stage 1 through stage 4 chronic kidney disease, or unspecified chronic kidney disease: Secondary | ICD-10-CM | POA: Insufficient documentation

## 2023-10-31 DIAGNOSIS — Z1211 Encounter for screening for malignant neoplasm of colon: Secondary | ICD-10-CM | POA: Insufficient documentation

## 2023-10-31 DIAGNOSIS — Z8616 Personal history of COVID-19: Secondary | ICD-10-CM | POA: Diagnosis not present

## 2023-10-31 DIAGNOSIS — I5020 Unspecified systolic (congestive) heart failure: Secondary | ICD-10-CM | POA: Insufficient documentation

## 2023-10-31 DIAGNOSIS — I252 Old myocardial infarction: Secondary | ICD-10-CM | POA: Diagnosis not present

## 2023-10-31 DIAGNOSIS — K5730 Diverticulosis of large intestine without perforation or abscess without bleeding: Secondary | ICD-10-CM | POA: Diagnosis not present

## 2023-10-31 DIAGNOSIS — D123 Benign neoplasm of transverse colon: Secondary | ICD-10-CM | POA: Diagnosis not present

## 2023-10-31 DIAGNOSIS — N189 Chronic kidney disease, unspecified: Secondary | ICD-10-CM | POA: Insufficient documentation

## 2023-10-31 DIAGNOSIS — Z87891 Personal history of nicotine dependence: Secondary | ICD-10-CM | POA: Insufficient documentation

## 2023-10-31 DIAGNOSIS — Z7984 Long term (current) use of oral hypoglycemic drugs: Secondary | ICD-10-CM | POA: Insufficient documentation

## 2023-10-31 HISTORY — PX: POLYPECTOMY: SHX5525

## 2023-10-31 HISTORY — PX: COLONOSCOPY WITH PROPOFOL: SHX5780

## 2023-10-31 LAB — GLUCOSE, CAPILLARY: Glucose-Capillary: 149 mg/dL — ABNORMAL HIGH (ref 70–99)

## 2023-10-31 SURGERY — COLONOSCOPY WITH PROPOFOL
Anesthesia: General

## 2023-10-31 MED ORDER — LIDOCAINE HCL (CARDIAC) PF 100 MG/5ML IV SOSY
PREFILLED_SYRINGE | INTRAVENOUS | Status: DC | PRN
  Administered 2023-10-31: 100 mg via INTRAVENOUS

## 2023-10-31 MED ORDER — SODIUM CHLORIDE 0.9 % IV SOLN
INTRAVENOUS | Status: DC
Start: 2023-10-31 — End: 2023-10-31

## 2023-10-31 MED ORDER — PROPOFOL 500 MG/50ML IV EMUL
INTRAVENOUS | Status: DC | PRN
  Administered 2023-10-31: 150 ug/kg/min via INTRAVENOUS
  Administered 2023-10-31: 50 mg via INTRAVENOUS

## 2023-10-31 NOTE — Transfer of Care (Signed)
 Immediate Anesthesia Transfer of Care Note  Patient: Gilbert Obrien  Procedure(s) Performed: COLONOSCOPY WITH PROPOFOL   Patient Location: PACU  Anesthesia Type:General  Level of Consciousness: drowsy  Airway & Oxygen  Therapy: Patient Spontanous Breathing and Patient connected to nasal cannula oxygen   Post-op Assessment: Report given to RN and Post -op Vital signs reviewed and stable  Post vital signs: stable  Last Vitals:  Vitals Value Taken Time  BP    Temp    Pulse    Resp    SpO2      Last Pain:  Vitals:   10/31/23 1037  TempSrc: Temporal  PainSc: 0-No pain         Complications: No notable events documented.

## 2023-10-31 NOTE — Interval H&P Note (Signed)
 History and Physical Interval Note:  10/31/2023 11:02 AM  Gilbert Obrien  has presented today for surgery, with the diagnosis of Z12.11 (ICD-10-CM) - Colon cancer screening.  The various methods of treatment have been discussed with the patient and family. After consideration of risks, benefits and other options for treatment, the patient has consented to  Procedure(s): COLONOSCOPY WITH PROPOFOL  (N/A) as a surgical intervention.  The patient's history has been reviewed, patient examined, no change in status, stable for surgery.  I have reviewed the patient's chart and labs.  Questions were answered to the patient's satisfaction.     [place], Marieli Rudy

## 2023-10-31 NOTE — H&P (Signed)
 Outpatient short stay form Pre-procedure 10/31/2023 11:00 AM [name], M.D.  Primary Physician: Jeoffrey Pollen, NP  Reason for visit:  Colon cancer screening  History of present illness: [name] presents to the [facility] at the request of Jeoffrey Pollen, NP, at [facility] for chief complaint of colon cancer screening. He presents to the clinic by himself today. He was seen by me in the clinic back in April 2021 for intermittent rectal bleeding and alternating constipation and diarrhea and advised to have a colonoscopy, but this was never performed. He reports when it was scheduled he ended up developing COVID-19 and had to reschedule. Last colonoscopy reportedly ~2008 and was negative. He knows he is overdue. He denies any major GI complaints or concerns at this time. He denies any recent changes in his bowel habits. Bowels are moving well without any diarrhea, constipation, hematochezia, or melena. He denies any fecal urgency, fecal incontinence, abdominal pain, or abdominal bloating. Appetite and diet are stable without any unintentional weight loss. He denies any UGI symptoms such as nausea, vomiting, esophageal dysphagia, odynophagia, early satiety, hoarseness, or epigastric abdominal pain. No daily acid suppression therapy. He follows in Cardiology with Dr. Ozell Fell in [place] at [facility] for hx of HF with mildly reduced EF (45-50%), CAD s/p inferolateral/posterior STEMI in 2017 requiring multiple stents. He is only on baby aspirin  for antiplatelet therapy at this time. No recent cardiac events in the last several years. No other questions or concerns at this time.      Current Facility-Administered Medications:    0.9 %  sodium chloride  infusion, , Intravenous, Continuous, Emeree Mahler K, MD  Medications Prior to Admission  Medication Sig Dispense Refill Last Dose/Taking   amLODipine  (NORVASC ) 10 MG tablet TAKE 1 TABLET EVERY DAY 90 tablet 3  Past Week   aspirin  EC 81 MG tablet Take 1 tablet (81 mg total) by mouth daily. 90 tablet 3 Past Week   atorvastatin  (LIPITOR ) 80 MG tablet Take 1 tablet (80 mg total) by mouth at bedtime for cholesterol. 90 tablet 3 Past Week   buPROPion  (WELLBUTRIN  XL) 150 MG 24 hr tablet Take 1 tablet (150 mg total) by mouth every morning. 90 tablet 3 Past Week   carvedilol  (COREG ) 25 MG tablet TAKE 1 TABLET IN THE MORNING AND AT BEDTIME (PLEASE CALL TO SCHEDULE APPOINTMENT WITH MD FOR FURTHER REFILLS) 180 tablet 3 Past Week   cholecalciferol (VITAMIN D3) 25 MCG (1000 UNIT) tablet Take 1,000 Units by mouth daily.   Past Week   empagliflozin  (JARDIANCE ) 25 MG TABS tablet Take 1 tablet (25 mg total) by mouth daily before breakfast. 30 tablet 4 Past Week   glipiZIDE  (GLUCOTROL  XL) 10 MG 24 hr tablet Take 10 mg by mouth daily.   Past Week   Multiple Vitamins-Minerals (ONE-A-DAY MENS 50+) TABS Take 1 tablet by mouth daily.   Past Week   ramipril  (ALTACE ) 5 MG capsule Take 1 capsule (5 mg total) by mouth 2 (two) times daily. 180 capsule 3 Past Week   temazepam  (RESTORIL ) 15 MG capsule Take 1 capsule (15 mg total) by mouth at bedtime as needed for sleep. 30 capsule 0 Past Week   Zinc  50 MG TABS Take 1 tablet (50 mg total) by mouth daily. 90 tablet 0 Past Week     Allergies  Allergen Reactions   Celecoxib Anaphylaxis    Swelling in throat.   Sulfa Antibiotics Nausea And Vomiting and Swelling     Past Medical  History:  Diagnosis Date   Abdominal pain    Acute cholecystitis 03/25/2020   Acute respiratory failure with hypoxia (HCC) 06/28/2020   Arthritis    Cholecystitis 08/15/2016   Chronic kidney disease    per patient has stage 3 kidney failure    Coronary artery disease    a. STEMI 03/2016 DESx1 to LCx, DES x 1 Mid LAD, DES x1 prox RCA   COVID-19 virus infection 06/24/2020   Depression    Diabetes mellitus without complication (HCC)    type 2   Dyspnea    Hematoma of arm 04/19/2016   History of  acute inferior wall myocardial infarction 04/15/2016   inf-lat/post >> PCI with DES of LCx; staged PCI of LAD and RCA   Hypercholesteremia    Hypertension    Hypokalemia 04/19/2016   Ischemic cardiomyopathy 04/19/2016   A. Inf-lat/post STEMI 7/17 >> b. Echo 04/17/16: Septal and post lateral HK, poor image quality, EF 35-40% // b. Echo 11/17: mild LVH, EF 50-55, inf-lat and ant-lat HK, Gr 1 DD, borderline dilated aortic root (37 mm), MAC   Leg pain    ABIs 3/19:  normal   Weakness     Review of systems:  Otherwise negative.    Physical Exam  Gen: Alert, oriented. Appears stated age.  HEENT: [place]/AT. PERRLA. Lungs: CTA, no wheezes. CV: RR nl S1, S2. Abd: soft, benign, no masses. BS+ Ext: No edema. Pulses 2+    Planned procedures: Proceed with colonoscopy. The patient understands the nature of the planned procedure, indications, risks, alternatives and potential complications including but not limited to bleeding, infection, perforation, damage to internal organs and possible oversedation/side effects from anesthesia. The patient agrees and gives consent to proceed.  Please refer to procedure notes for findings, recommendations and patient disposition/instructions.     Loeta Herst K. Aundria, M.D. Gastroenterology 10/31/2023  11:00 AM

## 2023-10-31 NOTE — Op Note (Signed)
 [facility] Gastroenterology Patient Name: Gilbert Obrien Procedure Date: 10/31/2023 11:00 AM MRN: [id number] Account #: [id number] Date of Birth: 03-29-1946 Admit Type: Outpatient Age: 78 Room: [facility] ENDO ROOM 2 Gender: Male Note Status: Finalized Instrument Name: Colonoscope [id number],Colonoscope [id number] Procedure:             Colonoscopy Indications:           Screening for colorectal malignant neoplasm Providers:             Kelle Ruppert K. Aundria MD, MD Referring MD:          Delbra Gauze Medicines:             Propofol  per Anesthesia Complications:         No immediate complications. Estimated blood loss: None. Procedure:             Pre-Anesthesia Assessment:                        - The risks and benefits of the procedure and the                         sedation options and risks were discussed with the                         patient. All questions were answered and informed                         consent was obtained.                        - Patient identification and proposed procedure were                         verified prior to the procedure by the nurse. The                         procedure was verified in the procedure room.                        - ASA Grade Assessment: III - A patient with severe                         systemic disease.                        - After reviewing the risks and benefits, the patient                         was deemed in satisfactory condition to undergo the                         procedure.                        After obtaining informed consent, the colonoscope was                         passed under direct vision. Throughout the procedure,  the patient's blood pressure, pulse, and oxygen                          saturations were monitored continuously. The                         Colonoscope was introduced through the anus and                         advanced to the the cecum, identified  by appendiceal                         orifice and ileocecal valve. The Colonoscope was                         introduced through the and advanced to the. The                         colonoscopy was performed without difficulty. The                         patient tolerated the procedure well. The quality of                         the bowel preparation was good. The ileocecal valve,                         appendiceal orifice, and rectum were photographed. Findings:      The perianal and digital rectal examinations were normal. Pertinent       negatives include normal sphincter tone and no palpable rectal lesions.      Non-bleeding internal hemorrhoids were found during retroflexion. The       hemorrhoids were Grade I (internal hemorrhoids that do not prolapse).      Many small-mouthed diverticula were found in the sigmoid colon.      Two sessile polyps were found in the splenic flexure and transverse       colon. The polyps were 6 to 11 mm in size. These polyps were removed       with a hot snare. Resection and retrieval were complete.      The exam was otherwise without abnormality. Impression:            - Non-bleeding internal hemorrhoids.                        - Diverticulosis in the sigmoid colon.                        - Two 6 to 11 mm polyps at the splenic flexure and in                         the transverse colon, removed with a hot snare.                         Resected and retrieved.                        - The examination was otherwise normal. Recommendation:        -  Patient has a contact number available for                         emergencies. The signs and symptoms of potential                         delayed complications were discussed with the patient.                         Return to normal activities tomorrow. Written                         discharge instructions were provided to the patient.                        - Resume previous diet.                         - Continue present medications.                        - If polyps are benign or adenomatous without                         dysplasia, I will advise NO further colonoscopy due to                         advanced age and/or severe comorbidity.                        - Return to GI office PRN.                        - The findings and recommendations were discussed with                         the patient. Procedure Code(s):     --- Professional ---                        [id number], Colonoscopy, flexible; with removal of                         tumor(s), polyp(s), or other lesion(s) by snare                         technique Diagnosis Code(s):     --- Professional ---                        K57.30, Diverticulosis of large intestine without                         perforation or abscess without bleeding                        D12.3, Benign neoplasm of transverse colon (hepatic                         flexure or splenic flexure)  K64.0, First degree hemorrhoids                        Z12.11, Encounter for screening for malignant neoplasm                         of colon CPT copyright 2022 American [employer] Association. All rights reserved. The codes documented in this report are preliminary and upon coder review may  be revised to meet current compliance requirements. Ladell MARLA Boss MD, MD 10/31/2023 11:29:31 AM This report has been signed electronically. Number of Addenda: 0 Note Initiated On: 10/31/2023 11:00 AM Scope Withdrawal Time: 0 hours 6 minutes 30 seconds  Total Procedure Duration: 0 hours 9 minutes 57 seconds  Estimated Blood Loss:  Estimated blood loss: none.      [place]

## 2023-10-31 NOTE — Anesthesia Preprocedure Evaluation (Signed)
 Anesthesia Evaluation  Patient identified by MRN, date of birth, ID band Patient awake    Reviewed: Allergy & Precautions, NPO status , Patient's Chart, lab work & pertinent test results  History of Anesthesia Complications Negative for: history of anesthetic complications  Airway Mallampati: III  TM Distance: <3 FB Neck ROM: full    Dental  (+) Chipped, Poor Dentition, Missing, Upper Dentures   Pulmonary neg pulmonary ROS, neg shortness of breath, former smoker   Pulmonary exam normal        Cardiovascular Exercise Tolerance: Good hypertension, (-) angina + CAD and +CHF  Normal cardiovascular exam     Neuro/Psych  PSYCHIATRIC DISORDERS      negative neurological ROS     GI/Hepatic negative GI ROS, Neg liver ROS,,,  Endo/Other  diabetes, Type 2    Renal/GU Renal disease  negative genitourinary   Musculoskeletal   Abdominal   Peds  Hematology negative hematology ROS (+)   Anesthesia Other Findings Past Medical History: No date: Abdominal pain 03/25/2020: Acute cholecystitis 06/28/2020: Acute respiratory failure with hypoxia (HCC) No date: Arthritis 08/15/2016: Cholecystitis No date: Chronic kidney disease     Comment:  per patient has stage 3 kidney failure  No date: Coronary artery disease     Comment:  a. STEMI 03/2016 DESx1 to LCx, DES x 1 Mid LAD, DES x1               prox RCA 06/24/2020: COVID-19 virus infection No date: Depression No date: Diabetes mellitus without complication (HCC)     Comment:  type 2 No date: Dyspnea 04/19/2016: Hematoma of arm 04/15/2016: History of acute inferior wall myocardial infarction     Comment:  inf-lat/post >> PCI with DES of LCx; staged PCI of LAD               and RCA No date: Hypercholesteremia No date: Hypertension 04/19/2016: Hypokalemia 04/19/2016: Ischemic cardiomyopathy     Comment:  A. Inf-lat/post STEMI 7/17 >> b. Echo 04/17/16: Septal               and  post lateral HK, poor image quality, EF 35-40% // b.               Echo 11/17: mild LVH, EF 50-55, inf-lat and ant-lat HK,               Gr 1 DD, borderline dilated aortic root (37 mm), MAC No date: Leg pain     Comment:  ABIs 3/19:  normal No date: Weakness  Past Surgical History: 04/15/2016: CARDIAC CATHETERIZATION; N/A     Comment:  Procedure: Left Heart Cath and Coronary Angiography;                Surgeon: Ozell Fell, MD;  Location: [facility] INVASIVE CV               LAB;  Service: Cardiovascular;  Laterality: N/A; 04/15/2016: CARDIAC CATHETERIZATION; N/A     Comment:  Procedure: Coronary Stent Intervention;  Surgeon:               Ozell Fell, MD;  Location: [facility] INVASIVE CV LAB;                Service: Cardiovascular;  Laterality: N/A; 04/17/2016: CARDIAC CATHETERIZATION; N/A     Comment:  Procedure: Coronary Stent Intervention;  Surgeon:               Lonni JONETTA Cash, MD;  Location: [facility] INVASIVE CV  LAB;  Service: Cardiovascular;  Laterality: N/A; 04/18/2016: CARDIAC CATHETERIZATION; N/A     Comment:  Procedure: Coronary Stent Intervention;  Surgeon:               Lonni JONETTA Cash, MD;  Location: [facility] INVASIVE CV               LAB;  Service: Cardiovascular;  Laterality: N/A; 04/18/2016: CARDIAC CATHETERIZATION; N/A     Comment:  Procedure: Temporary Pacemaker;  Surgeon: Lonni JONETTA Cash, MD;  Location: [facility] INVASIVE CV LAB;  Service:               Cardiovascular;  Laterality: N/A; 04/17/2016: CORONARY STENT PLACEMENT     Comment:   Severe stenosis proximal LAD, now s/p successful               PTCA/DES x 1 proximal and mid LAD 1974: COSMETIC SURGERY     Comment:  right side facial      No date: SHOULDER SURGERY  BMI    Body Mass Index: 27.50 kg/m      Reproductive/Obstetrics negative OB ROS                             Anesthesia Physical Anesthesia Plan  ASA: 3  Anesthesia Plan: General   Post-op Pain  Management:    Induction: Intravenous  PONV Risk Score and Plan: Propofol  infusion and TIVA  Airway Management Planned: Natural Airway and Nasal Cannula  Additional Equipment:   Intra-op Plan:   Post-operative Plan:   Informed Consent: I have reviewed the patients History and Physical, chart, labs and discussed the procedure including the risks, benefits and alternatives for the proposed anesthesia with the patient or authorized representative who has indicated his/her understanding and acceptance.     Dental Advisory Given  Plan Discussed with: Anesthesiologist, CRNA and Surgeon  Anesthesia Plan Comments: (Patient consented for risks of anesthesia including but not limited to:  - adverse reactions to medications - risk of airway placement if required - damage to eyes, teeth, lips or other oral mucosa - nerve damage due to positioning  - sore throat or hoarseness - Damage to heart, brain, nerves, lungs, other parts of body or loss of life  Patient voiced understanding and assent.)       Anesthesia Quick Evaluation

## 2023-11-01 ENCOUNTER — Encounter: Payer: Self-pay | Admitting: Internal Medicine

## 2023-11-01 LAB — SURGICAL PATHOLOGY

## 2023-11-01 NOTE — Anesthesia Postprocedure Evaluation (Signed)
 Anesthesia Post Note  Patient: Gilbert Obrien  Procedure(s) Performed: COLONOSCOPY WITH PROPOFOL  POLYPECTOMY  Patient location during evaluation: Endoscopy Anesthesia Type: General Level of consciousness: awake and alert Pain management: pain level controlled Vital Signs Assessment: post-procedure vital signs reviewed and stable Respiratory status: spontaneous breathing, nonlabored ventilation, respiratory function stable and patient connected to nasal cannula oxygen  Cardiovascular status: blood pressure returned to baseline and stable Postop Assessment: no apparent nausea or vomiting Anesthetic complications: no   There were no known notable events for this encounter.   Last Vitals:  Vitals:   10/31/23 1127 10/31/23 1137  BP: (!) 120/54 (!) 112/91  Pulse: 73 69  Resp:    Temp:    SpO2: 99% 100%    Last Pain:  Vitals:   10/31/23 1137  TempSrc:   PainSc: 0-No pain                 Fairy POUR Raza Bayless

## 2023-11-28 ENCOUNTER — Other Ambulatory Visit (HOSPITAL_COMMUNITY): Payer: Self-pay

## 2023-11-29 ENCOUNTER — Other Ambulatory Visit: Payer: Self-pay

## 2023-11-29 MED ORDER — GLUCOSE BLOOD VI STRP
ORAL_STRIP | 12 refills | Status: AC
  Filled 2023-11-29: qty 100, 50d supply, fill #0

## 2023-11-29 MED ORDER — ONETOUCH VERIO W/DEVICE KIT
1.0000 | PACK | Freq: Every day | 0 refills | Status: AC
  Filled 2023-11-29: qty 1, 30d supply, fill #0

## 2023-11-29 MED ORDER — GLIPIZIDE ER 10 MG PO TB24
10.0000 mg | ORAL_TABLET | Freq: Every day | ORAL | 3 refills | Status: DC
  Filled 2023-11-29: qty 30, 30d supply, fill #0
  Filled 2024-01-01: qty 30, 30d supply, fill #1
  Filled 2024-02-19: qty 30, 30d supply, fill #2

## 2023-11-29 MED ORDER — AMLODIPINE BESYLATE 10 MG PO TABS
10.0000 mg | ORAL_TABLET | Freq: Every day | ORAL | 3 refills | Status: DC
  Filled 2023-11-29: qty 90, 90d supply, fill #0
  Filled 2024-04-03: qty 90, 90d supply, fill #1

## 2023-11-29 MED ORDER — ONETOUCH ULTRASOFT LANCETS MISC
12 refills | Status: AC
  Filled 2023-11-29: qty 100, 50d supply, fill #0

## 2023-11-29 MED ORDER — CARVEDILOL 25 MG PO TABS
ORAL_TABLET | ORAL | 3 refills | Status: DC
  Filled 2023-11-29: qty 180, 90d supply, fill #0
  Filled 2024-04-03: qty 180, 90d supply, fill #1

## 2023-11-29 MED ORDER — ALCOHOL SWABS PADS
MEDICATED_PAD | 3 refills | Status: AC
  Filled 2023-11-29: qty 100, 50d supply, fill #0

## 2023-11-30 ENCOUNTER — Other Ambulatory Visit (HOSPITAL_COMMUNITY): Payer: Self-pay

## 2023-11-30 MED ORDER — TEMAZEPAM 15 MG PO CAPS
15.0000 mg | ORAL_CAPSULE | Freq: Every evening | ORAL | 2 refills | Status: DC | PRN
  Filled 2023-11-30: qty 30, 30d supply, fill #0
  Filled 2024-04-03: qty 30, 30d supply, fill #1

## 2023-12-03 ENCOUNTER — Other Ambulatory Visit: Payer: Self-pay

## 2023-12-06 ENCOUNTER — Other Ambulatory Visit (HOSPITAL_COMMUNITY): Payer: Self-pay

## 2023-12-14 ENCOUNTER — Other Ambulatory Visit (HOSPITAL_COMMUNITY): Payer: Self-pay

## 2024-01-01 ENCOUNTER — Other Ambulatory Visit (HOSPITAL_COMMUNITY): Payer: Self-pay

## 2024-01-02 ENCOUNTER — Other Ambulatory Visit (HOSPITAL_COMMUNITY): Payer: Self-pay

## 2024-02-19 ENCOUNTER — Other Ambulatory Visit (HOSPITAL_COMMUNITY): Payer: Self-pay

## 2024-02-19 ENCOUNTER — Other Ambulatory Visit: Payer: Self-pay

## 2024-02-19 MED ORDER — GLIPIZIDE ER 10 MG PO TB24: 10.0000 mg | ORAL_TABLET | Freq: Every day | ORAL | 0 refills | Status: DC

## 2024-04-03 ENCOUNTER — Other Ambulatory Visit (HOSPITAL_COMMUNITY): Payer: Self-pay

## 2024-04-03 MED FILL — Atorvastatin Calcium Tab 80 MG (Base Equivalent): ORAL | 90 days supply | Qty: 90 | Fill #0 | Status: AC

## 2024-04-03 MED FILL — Ramipril Cap 5 MG: ORAL | 90 days supply | Qty: 180 | Fill #0 | Status: AC

## 2024-04-03 MED FILL — Bupropion HCl Tab ER 24HR 150 MG: ORAL | 90 days supply | Qty: 90 | Fill #0 | Status: AC

## 2024-05-18 ENCOUNTER — Other Ambulatory Visit: Payer: Self-pay | Admitting: Cardiology

## 2024-06-24 ENCOUNTER — Encounter: Payer: Self-pay | Admitting: Pediatrics

## 2024-06-24 ENCOUNTER — Ambulatory Visit: Admitting: Pediatrics

## 2024-06-24 VITALS — BP 169/75 | HR 69 | Temp 97.7°F | Ht 65.0 in | Wt 168.4 lb

## 2024-06-24 DIAGNOSIS — Z7689 Persons encountering health services in other specified circumstances: Secondary | ICD-10-CM

## 2024-06-24 DIAGNOSIS — R2681 Unsteadiness on feet: Secondary | ICD-10-CM | POA: Diagnosis not present

## 2024-06-24 DIAGNOSIS — I10 Essential (primary) hypertension: Secondary | ICD-10-CM | POA: Diagnosis not present

## 2024-06-24 DIAGNOSIS — E1122 Type 2 diabetes mellitus with diabetic chronic kidney disease: Secondary | ICD-10-CM

## 2024-06-24 DIAGNOSIS — Z7984 Long term (current) use of oral hypoglycemic drugs: Secondary | ICD-10-CM

## 2024-06-24 DIAGNOSIS — Z1330 Encounter for screening examination for mental health and behavioral disorders, unspecified: Secondary | ICD-10-CM | POA: Diagnosis not present

## 2024-06-24 DIAGNOSIS — N1831 Chronic kidney disease, stage 3a: Secondary | ICD-10-CM

## 2024-06-24 LAB — MICROALBUMIN, URINE WAIVED
Creatinine, Urine Waived: 50 mg/dL (ref 10–300)
Microalb, Ur Waived: 150 mg/L — ABNORMAL HIGH (ref 0–19)
Microalb/Creat Ratio: >300 mg/g — ABNORMAL HIGH (ref <30)

## 2024-06-24 MED ORDER — OLMESARTAN MEDOXOMIL 20 MG PO TABS: 20.0000 mg | ORAL_TABLET | Freq: Every day | ORAL | 2 refills | Status: DC

## 2024-06-24 NOTE — Patient Instructions (Addendum)
 Stop ramipril   Start olmesartan 20mg  daily for your blood pressure. Continue taking amlodipine  as well.  Good to meet you! Welcome to [facility]!  As your primary care doctor, I look forward to working with you to help you reach your health goals.  Please be aware of a couple of logistical items: - If you message me on mychart, it may take me 1-2 business days to get back to you. This is for non-urgent messaging.  - If you require urgent clinical attention, please call the clinic or present to urgent care/emergency room - If you have labs, I typically will send a message about them in 1-2 business days. - I am not here on Mondays, otherwise will be available from Tuesday-Friday during 8a-5pm.

## 2024-06-24 NOTE — Progress Notes (Signed)
 Establish Care Note  BP (!) 169/75   Pulse 69   Temp 97.7 F (36.5 C) (Oral)   Ht 5' 5 (1.651 m)   Wt 168 lb 6.4 oz (76.4 kg)   SpO2 98%   BMI 28.02 kg/m    Subjective:    Patient ID: Gilbert Obrien, male    DOB: 1946/06/29, 78 y.o.   MRN: [id number]  HPI: Gilbert Obrien is a 78 y.o. male  Chief Complaint  Patient presents with   Establish Care    Establishing care, the following was discussed today:  Discussed the use of AI scribe software for clinical note transcription with the patient, who gave verbal consent to proceed.  History of Present Illness   Gilbert Obrien is a 78 year old male with hypertension and a history of myocardial infarction who presents to establish care.  He is transitioning care due to communication and medication management issues with his previous provider at [facility].  He has hypertension with recent readings around 150/100 mmHg. He takes ramipril  twice daily and amlodipine . Home monitoring is complicated by a blood pressure cuff that is too large. He had a myocardial infarction in 2018, which led to his current blood pressure management regimen.  He experiences a sensation of falling forward when bending over, which he attributes to his cholesterol medication. This has persisted for about six months. No muscle pain is reported, but he has chronic knee pain.  He has diabetes managed with glipizide . He faces dietary challenges, often eating out and consuming high-carbohydrate foods. He denies using other diabetes medications due to past negative experiences with metformin and concerns about other medications. He has increased urination and thirst. He wants to improve his diet and is making efforts to reduce carbohydrate intake, such as choosing low-carb ice cream.      Current Outpatient Medications on File Prior to Visit  Medication Sig Dispense Refill   Alcohol  Swabs  PADS Use with diabetic supplies to clean finger before stick  100 each 3   amLODipine  (NORVASC ) 10 MG tablet Take 1 tablet (10 mg total) by mouth daily. 90 tablet 3   aspirin  EC 81 MG tablet Take 1 tablet (81 mg total) by mouth daily. 90 tablet 3   atorvastatin  (LIPITOR ) 80 MG tablet Take 1 tablet (80 mg total) by mouth at bedtime for cholesterol. 90 tablet 3   buPROPion  (WELLBUTRIN  XL) 150 MG 24 hr tablet Take 1 tablet (150 mg total) by mouth every morning. 90 tablet 3   carvedilol  (COREG ) 25 MG tablet TAKE 1 TABLET IN THE MORNING AND AT BEDTIME (PLEASE CALL TO SCHEDULE APPOINTMENT WITH MD FOR FURTHER REFILLS) 180 tablet 3   cholecalciferol (VITAMIN D3) 25 MCG (1000 UNIT) tablet Take 1,000 Units by mouth daily.     glipiZIDE  (GLUCOTROL  XL) 10 MG 24 hr tablet Take 1 tablet by mouth once daily 90 tablet 0   glucose blood test strip Use as instructed 100 each 12   Lancets (ONETOUCH ULTRASOFT) lancets Use as instructed 100 each 12   Multiple Vitamins-Minerals (ONE-A-DAY MENS 50+) TABS Take 1 tablet by mouth daily.     temazepam  (RESTORIL ) 15 MG capsule Take 1 capsule (15 mg total) by mouth at bedtime as needed for sleep. 30 capsule 2   Zinc  50 MG TABS Take 1 tablet (50 mg total) by mouth daily. 90 tablet 0   No current facility-administered medications on file prior to visit.    #HM Will review HM records  and updated as needed.  Relevant past medical, surgical, family and social history reviewed and updated as indicated. Interim medical history since our last visit reviewed. Allergies and medications reviewed and updated.  ROS per HPI unless specifically indicated above     Objective:    BP (!) 169/75   Pulse 69   Temp 97.7 F (36.5 C) (Oral)   Ht 5' 5 (1.651 m)   Wt 168 lb 6.4 oz (76.4 kg)   SpO2 98%   BMI 28.02 kg/m   Wt Readings from Last 3 Encounters:  06/24/24 168 lb 6.4 oz (76.4 kg)  10/31/23 170 lb 6.4 oz (77.3 kg)  09/06/23 171 lb 6.4 oz (77.7 kg)     Physical Exam Constitutional:      Appearance: Normal appearance.   Pulmonary:     Effort: Pulmonary effort is normal.  Musculoskeletal:        General: Normal range of motion.  Skin:    Comments: Normal skin color  Neurological:     General: No focal deficit present.     Mental Status: He is alert. Mental status is at baseline.  Psychiatric:        Mood and Affect: Mood normal.        Behavior: Behavior normal.        Thought Content: Thought content normal.         06/24/2024    3:27 PM 07/20/2016    6:11 PM 05/11/2016    1:43 PM  Depression screen PHQ 2/9  Decreased Interest 1 1 1   Down, Depressed, Hopeless 1 1 1   PHQ - 2 Score 2 2 2   Altered sleeping 0 2 1  Tired, decreased energy 1 2 1   Change in appetite 1 1 0  Feeling bad or failure about yourself  0 1 0  Trouble concentrating 0 1 0  Moving slowly or fidgety/restless 0 0 0  Suicidal thoughts 0 0  0   PHQ-9 Score 4 9 4   Difficult doing work/chores Somewhat difficult Somewhat difficult Somewhat difficult     Data saved with a previous flowsheet row definition        06/24/2024    3:28 PM  GAD 7 : Generalized Anxiety Score  Nervous, Anxious, on Edge 0  Control/stop worrying 0  Worry too much - different things 0  Trouble relaxing 0  Restless 0  Easily annoyed or irritable 0  Afraid - awful might happen 0  Total GAD 7 Score 0  Anxiety Difficulty Not difficult at all       Assessment & Plan:  Assessment & Plan   Essential hypertension Assessment & Plan: Blood pressure elevated at 150/100 mmHg. Ramipril  inadequate. History of myocardial infarction necessitates conservative management. - Discontinue ramipril . - Initiate olmesartan. - Continue amlodipine . - Arrange for new blood pressure monitor with appropriate cuff size. - Follow up in 2-3 weeks to assess control.  Orders: -     Basic metabolic panel with GFR -     Olmesartan Medoxomil; Take 1 tablet (20 mg total) by mouth daily.  Dispense: 30 tablet; Refill: 2  Type 2 diabetes mellitus with stage 3a chronic  kidney disease, without long-term current use of insulin  (HCC) Assessment & Plan: Current regimen includes glipizide . Concerns about dietary habits and potential hyperglycemia. Declined injectables due to side effect concerns. - Order blood work for sugar levels and deficiencies. - Provide nutritional guidance and handouts. - Discuss potential medication adjustments based on results. - Plan to provide  simple, healthy recipes at next visit.   Orders: -     Microalbumin, Urine Waived -     Hemoglobin A1c -     Lipid panel  Gait instability Assessment & Plan: Sensation of falling forward possibly related to hypertension or other factors. No significant muscle pain from statin use. Increased urination may indicate hyperglycemia. - Evaluate blood pressure control. - Order blood work for electrolyte imbalances. - Check urine for protein.   Encounter to establish care Reviewed available patient record including history, medications, problem list. HM updated as able. Will review and/or request outside records (if applicable) and will fill remaining HM gaps as needed at follow up visit.  Encounter for behavioral health screening As part of their intake evaluation, the patient was screened for depression, anxiety.  PHQ9 SCORE 4, GAD7 SCORE 0. Screening results negative for tested conditions. See plan under problem/diagnosis above.    Follow up plan: Return in about 2 weeks (around 07/08/2024) for HTN.  Hadassah SHAUNNA Nett, MD

## 2024-06-27 ENCOUNTER — Ambulatory Visit: Payer: Self-pay | Admitting: Pediatrics

## 2024-06-30 ENCOUNTER — Encounter: Payer: Self-pay | Admitting: Pediatrics

## 2024-06-30 DIAGNOSIS — R2681 Unsteadiness on feet: Secondary | ICD-10-CM | POA: Insufficient documentation

## 2024-06-30 NOTE — Assessment & Plan Note (Signed)
 Blood pressure elevated at 150/100 mmHg. Ramipril  inadequate. History of myocardial infarction necessitates conservative management. - Discontinue ramipril . - Initiate olmesartan. - Continue amlodipine . - Arrange for new blood pressure monitor with appropriate cuff size. - Follow up in 2-3 weeks to assess control.

## 2024-06-30 NOTE — Assessment & Plan Note (Signed)
 Sensation of falling forward possibly related to hypertension or other factors. No significant muscle pain from statin use. Increased urination may indicate hyperglycemia. - Evaluate blood pressure control. - Order blood work for electrolyte imbalances. - Check urine for protein.

## 2024-06-30 NOTE — Assessment & Plan Note (Signed)
 Current regimen includes glipizide . Concerns about dietary habits and potential hyperglycemia. Declined injectables due to side effect concerns. - Order blood work for sugar levels and deficiencies. - Provide nutritional guidance and handouts. - Discuss potential medication adjustments based on results. - Plan to provide simple, healthy recipes at next visit.

## 2024-07-01 ENCOUNTER — Other Ambulatory Visit

## 2024-07-01 DIAGNOSIS — I10 Essential (primary) hypertension: Secondary | ICD-10-CM

## 2024-07-01 DIAGNOSIS — E1122 Type 2 diabetes mellitus with diabetic chronic kidney disease: Secondary | ICD-10-CM

## 2024-07-02 ENCOUNTER — Encounter: Payer: Self-pay | Admitting: Pediatrics

## 2024-07-02 ENCOUNTER — Telehealth: Payer: Self-pay | Admitting: Internal Medicine

## 2024-07-02 ENCOUNTER — Ambulatory Visit: Admitting: Pediatrics

## 2024-07-02 ENCOUNTER — Ambulatory Visit: Payer: Self-pay | Admitting: Pediatrics

## 2024-07-02 VITALS — BP 161/71 | HR 79 | Temp 98.1°F | Ht 65.0 in | Wt 168.0 lb

## 2024-07-02 DIAGNOSIS — E1159 Type 2 diabetes mellitus with other circulatory complications: Secondary | ICD-10-CM | POA: Diagnosis not present

## 2024-07-02 DIAGNOSIS — I152 Hypertension secondary to endocrine disorders: Secondary | ICD-10-CM

## 2024-07-02 DIAGNOSIS — E1122 Type 2 diabetes mellitus with diabetic chronic kidney disease: Secondary | ICD-10-CM | POA: Diagnosis not present

## 2024-07-02 DIAGNOSIS — N1831 Chronic kidney disease, stage 3a: Secondary | ICD-10-CM

## 2024-07-02 DIAGNOSIS — E785 Hyperlipidemia, unspecified: Secondary | ICD-10-CM

## 2024-07-02 DIAGNOSIS — E1169 Type 2 diabetes mellitus with other specified complication: Secondary | ICD-10-CM | POA: Diagnosis not present

## 2024-07-02 LAB — URINALYSIS, ROUTINE W REFLEX MICROSCOPIC
Bilirubin, UA: NEGATIVE
Ketones, UA: NEGATIVE
Leukocytes,UA: NEGATIVE
Nitrite, UA: NEGATIVE
Specific Gravity, UA: 1.02 (ref 1.005–1.030)
Urobilinogen, Ur: 0.2 mg/dL (ref 0.2–1.0)
pH, UA: 7 (ref 5.0–7.5)

## 2024-07-02 LAB — MICROSCOPIC EXAMINATION
Bacteria, UA: NONE SEEN
Epithelial Cells (non renal): NONE SEEN /HPF (ref 0–10)
WBC, UA: NONE SEEN /HPF (ref 0–5)

## 2024-07-02 NOTE — Assessment & Plan Note (Signed)
 Switched to olmesartan last visit. Continue regimen and revaluate at follow up. Continue amlodipine  10mg  as well.

## 2024-07-02 NOTE — Telephone Encounter (Signed)
 Called and notified patient of providers message. Patient coming at 3:20 this afternoon.

## 2024-07-02 NOTE — Progress Notes (Signed)
 Office Visit  BP (!) 161/71   Pulse 79   Temp 98.1 F (36.7 C) (Oral)   Ht 5' 5 (1.651 m)   Wt 168 lb (76.2 kg)   SpO2 97%   BMI 27.96 kg/m    Subjective:    Patient ID: Gilbert Obrien, male    DOB: 1946-05-17, 78 y.o.   MRN: [id number]  HPI: Gilbert Obrien is a 78 y.o. male  Chief Complaint  Patient presents with   Hyperglycemia    Discussed the use of AI scribe software for clinical note transcription with the patient, who gave verbal consent to proceed.  History of Present Illness   Gilbert Obrien is a 78 year old male with diabetes who presents with elevated blood glucose levels.  He has significantly elevated blood glucose levels, with a recent measurement of 510 mg/dL and a hemoglobin J8r of 12%. He consumes a high amount of sugary drinks and sweet foods, which may be contributing to his elevated glucose levels.  He is currently taking glipizide  for diabetes management. He has previously managed to lower his A1c to around 7% with dietary changes under the guidance of a previous healthcare provider. He prefers to manage his diabetes through diet rather than adding new medications.  He has a history of difficulty obtaining medication refills from a previous healthcare facility, which may have impacted his diabetes management. He has been consuming a large amount of sweet tea, up to a gallon a day, and mentions eating low-carb ice cream and chocolates.  He has a blood pressure monitor at home but has not been regularly checking his blood sugar levels. He recently started a new blood pressure medication and has discontinued ramipril , which he was taking twice a day.  He inquires about dietary options, expressing interest in healthier snack alternatives and meal planning. He mentions eating cereal like Cheerios or Wheat Chex and occasionally having a western omelet with vegetables.     Relevant past medical, surgical, family and social history reviewed and updated  as indicated. Interim medical history since our last visit reviewed. Allergies and medications reviewed and updated.  ROS per HPI unless specifically indicated above     Objective:    BP (!) 161/71   Pulse 79   Temp 98.1 F (36.7 C) (Oral)   Ht 5' 5 (1.651 m)   Wt 168 lb (76.2 kg)   SpO2 97%   BMI 27.96 kg/m   Wt Readings from Last 3 Encounters:  07/02/24 168 lb (76.2 kg)  06/24/24 168 lb 6.4 oz (76.4 kg)  10/31/23 170 lb 6.4 oz (77.3 kg)     Physical Exam Constitutional:      Appearance: Normal appearance.  Pulmonary:     Effort: Pulmonary effort is normal.  Musculoskeletal:        General: Normal range of motion.  Skin:    Comments: Normal skin color  Neurological:     General: No focal deficit present.     Mental Status: He is alert. Mental status is at baseline.  Psychiatric:        Mood and Affect: Mood normal.        Behavior: Behavior normal.        Thought Content: Thought content normal.         06/24/2024    3:27 PM 07/20/2016    6:11 PM 05/11/2016    1:43 PM  Depression screen PHQ 2/9  Decreased Interest 1 1 1   Down,  Depressed, Hopeless 1 1 1   PHQ - 2 Score 2 2 2   Altered sleeping 0 2 1  Tired, decreased energy 1 2 1   Change in appetite 1 1 0  Feeling bad or failure about yourself  0 1 0  Trouble concentrating 0 1 0  Moving slowly or fidgety/restless 0 0 0  Suicidal thoughts 0 0  0   PHQ-9 Score 4 9 4   Difficult doing work/chores Somewhat difficult Somewhat difficult Somewhat difficult     Data saved with a previous flowsheet row definition       06/24/2024    3:28 PM  GAD 7 : Generalized Anxiety Score  Nervous, Anxious, on Edge 0  Control/stop worrying 0  Worry too much - different things 0  Trouble relaxing 0  Restless 0  Easily annoyed or irritable 0  Afraid - awful might happen 0  Total GAD 7 Score 0  Anxiety Difficulty Not difficult at all       Assessment & Plan:  Assessment & Plan   Type 2 diabetes mellitus with stage  3a chronic kidney disease, without long-term current use of insulin  (HCC) Assessment & Plan: Severe hyperglycemia with glucose at 510 mg/dL and YaJ8r at 87%. High risk for diabetic complications. Prefers dietary management over medication, declines medication adjustments including insulin  or GLP1 (oral or injectable). Declines recommendations for ER in case worse levels on repeat blood work. Educated on risks of hyperglycemia, DKA and CV complications from untreated DM. - Provided dietary handouts with low sugar, high protein recipes. - Advised eliminating sugary drinks, sweet tea, and sweets. - Recommended fruits and vegetables as snacks. - Discussed potential use of weekly injectables like Ozempic or Mounjaro if needed. - Ordered repeat labs to monitor glucose. - Encouraged home blood glucose monitoring. - Discussed meeting with a nutritionist for meal planning.  Orders: -     Comprehensive metabolic panel with GFR -     Urinalysis, Routine w reflex microscopic  Hypertension associated with diabetes [facility]) Assessment & Plan: Switched to olmesartan last visit. Continue regimen and revaluate at follow up. Continue amlodipine  10mg  as well.    Hyperlipidemia associated with type 2 diabetes mellitus (HCC) Assessment & Plan: On statin. At goal.    Other orders -     Microscopic Examination     Follow up plan: Return in about 2 weeks (around 07/16/2024).  Hadassah SHAUNNA Nett, MD  Approximately 30 minutes spent on patient encounter today including assessment, counseling, diagnosing, treatment plan development, and charting.

## 2024-07-02 NOTE — Assessment & Plan Note (Signed)
On statin At goal

## 2024-07-02 NOTE — Assessment & Plan Note (Signed)
 Severe hyperglycemia with glucose at 510 mg/dL and YaJ8r at 87%. High risk for diabetic complications. Prefers dietary management over medication, declines medication adjustments including insulin  or GLP1 (oral or injectable). Declines recommendations for ER in case worse levels on repeat blood work. Educated on risks of hyperglycemia, DKA and CV complications from untreated DM. - Provided dietary handouts with low sugar, high protein recipes. - Advised eliminating sugary drinks, sweet tea, and sweets. - Recommended fruits and vegetables as snacks. - Discussed potential use of weekly injectables like Ozempic or Mounjaro if needed. - Ordered repeat labs to monitor glucose. - Encouraged home blood glucose monitoring. - Discussed meeting with a nutritionist for meal planning.

## 2024-07-02 NOTE — Telephone Encounter (Signed)
 Received call from access team RN at 2:27 AM regarding critical glucose of 510.  Known diabetic with A1c of 12.  Attempted to reach patient, no answer.  Will defer management to PCP.

## 2024-07-03 ENCOUNTER — Ambulatory Visit: Payer: Self-pay | Admitting: Pediatrics

## 2024-07-03 LAB — COMPREHENSIVE METABOLIC PANEL WITH GFR
ALT: 36 IU/L (ref 0–44)
AST: 21 IU/L (ref 0–40)
Albumin: 3.9 g/dL (ref 3.8–4.8)
Alkaline Phosphatase: 99 IU/L (ref 47–123)
BUN/Creatinine Ratio: 14 (ref 10–24)
BUN: 20 mg/dL (ref 8–27)
Bilirubin Total: 0.9 mg/dL (ref 0.0–1.2)
CO2: 21 mmol/L (ref 20–29)
Calcium: 9.1 mg/dL (ref 8.6–10.2)
Chloride: 99 mmol/L (ref 96–106)
Creatinine, Ser: 1.44 mg/dL — ABNORMAL HIGH (ref 0.76–1.27)
Globulin, Total: 2.3 g/dL (ref 1.5–4.5)
Glucose: 440 mg/dL — ABNORMAL HIGH (ref 70–99)
Potassium: 4.5 mmol/L (ref 3.5–5.2)
Sodium: 137 mmol/L (ref 134–144)
Total Protein: 6.2 g/dL (ref 6.0–8.5)
eGFR: 49 mL/min/1.73 — ABNORMAL LOW (ref >59)

## 2024-07-04 LAB — LIPID PANEL
Chol/HDL Ratio: 2.9 ratio (ref 0.0–5.0)
Cholesterol, Total: 131 mg/dL (ref 100–199)
HDL: 45 mg/dL (ref >39)
LDL Chol Calc (NIH): 59 mg/dL (ref 0–99)
Triglycerides: 158 mg/dL — ABNORMAL HIGH (ref 0–149)
VLDL Cholesterol Cal: 27 mg/dL (ref 5–40)

## 2024-07-04 LAB — BASIC METABOLIC PANEL WITH GFR
BUN/Creatinine Ratio: 14 (ref 10–24)
BUN: 21 mg/dL (ref 8–27)
CO2: 23 mmol/L (ref 20–29)
Calcium: 9.3 mg/dL (ref 8.6–10.2)
Chloride: 99 mmol/L (ref 96–106)
Creatinine, Ser: 1.45 mg/dL — ABNORMAL HIGH (ref 0.76–1.27)
Glucose: 510 mg/dL (ref 70–99)
Potassium: 4.5 mmol/L (ref 3.5–5.2)
Sodium: 134 mmol/L (ref 134–144)
eGFR: 49 mL/min/1.73 — ABNORMAL LOW (ref >59)

## 2024-07-04 LAB — HEMOGLOBIN A1C
Est. average glucose Bld gHb Est-mCnc: 298 mg/dL
Hgb A1c MFr Bld: 12 % — ABNORMAL HIGH (ref 4.8–5.6)

## 2024-07-07 ENCOUNTER — Other Ambulatory Visit: Payer: Self-pay | Admitting: Family

## 2024-07-07 ENCOUNTER — Other Ambulatory Visit: Payer: Self-pay | Admitting: Pediatrics

## 2024-07-07 NOTE — Telephone Encounter (Unsigned)
 Copied from CRM [id number]. Topic: Clinical - Medication Refill >> Jul 07, 2024 12:14 PM Antwanette L wrote: Medication: amLODipine  (NORVASC ) 10 MG tablet  and temazepam  (RESTORIL ) 15 MG capsule   Has the patient contacted their pharmacy? Yes (Agent: If no, request that the patient contact the pharmacy for the refill. If patient does not wish to contact the pharmacy document the reason why and proceed with request.) (Agent: If yes, when and what did the pharmacy advise?)  This is the patient's preferred pharmacy:  [facility] [address] ([address]), [place] - [address] [place]) [place] [address] Phone: [phone] Fax: [phone]  Is this the correct pharmacy for this prescription? Yes   Has the prescription been filled recently? Yes. Last refill  on amlodipine  was on 11/29/23 and temazepam  was 11/30/23  Is the patient out of the medication? Yes  Has the patient been seen for an appointment in the last year OR does the patient have an upcoming appointment? Yes. Last ov with Dr. Herold was on 07/02/24 and next appt is 07/18/24  Can we respond through MyChart? No. Contact the pt by phone at [phone]  Agent: Please be advised that Rx refills may take up to 3 business days. We ask that you follow-up with your pharmacy.

## 2024-07-08 ENCOUNTER — Ambulatory Visit: Admitting: Pediatrics

## 2024-07-08 MED ORDER — TEMAZEPAM 15 MG PO CAPS: 15.0000 mg | ORAL_CAPSULE | Freq: Every evening | ORAL | 2 refills | Status: AC | PRN

## 2024-07-08 MED ORDER — AMLODIPINE BESYLATE 10 MG PO TABS: 10.0000 mg | ORAL_TABLET | Freq: Every day | ORAL | 3 refills | Status: DC

## 2024-07-15 ENCOUNTER — Ambulatory Visit (INDEPENDENT_AMBULATORY_CARE_PROVIDER_SITE_OTHER): Admitting: Pediatrics

## 2024-07-15 ENCOUNTER — Encounter: Payer: Self-pay | Admitting: Pediatrics

## 2024-07-15 VITALS — BP 155/82 | HR 67 | Temp 97.7°F | Ht 65.0 in | Wt 165.8 lb

## 2024-07-15 DIAGNOSIS — R809 Proteinuria, unspecified: Secondary | ICD-10-CM

## 2024-07-15 DIAGNOSIS — E1129 Type 2 diabetes mellitus with other diabetic kidney complication: Secondary | ICD-10-CM

## 2024-07-15 DIAGNOSIS — I10 Essential (primary) hypertension: Secondary | ICD-10-CM

## 2024-07-15 DIAGNOSIS — I152 Hypertension secondary to endocrine disorders: Secondary | ICD-10-CM

## 2024-07-15 DIAGNOSIS — E1159 Type 2 diabetes mellitus with other circulatory complications: Secondary | ICD-10-CM

## 2024-07-15 MED ORDER — OLMESARTAN MEDOXOMIL 40 MG PO TABS: 40.0000 mg | ORAL_TABLET | Freq: Every day | ORAL | 3 refills | Status: DC

## 2024-07-15 NOTE — Patient Instructions (Addendum)
 For your blood pressure: - Continue taking amlodipine  10mg  and olmesartan. I am going to increase the olmesartan to 40mg . You can take 2 of the 20mg  tabs you have at home or just start the new prescription 40mg .

## 2024-07-15 NOTE — Progress Notes (Signed)
 Office Visit  BP (!) 155/82   Pulse 67   Temp 97.7 F (36.5 C) (Oral)   Ht 5' 5 (1.651 m)   Wt 165 lb 12.8 oz (75.2 kg)   SpO2 97%   BMI 27.59 kg/m    Subjective:    Patient ID: Gilbert Obrien, male    DOB: Jun 08, 1946, 78 y.o.   MRN: [id number]  HPI: Gilbert Obrien is a 78 y.o. male  Chief Complaint  Patient presents with   Hypertension    Discussed the use of AI scribe software for clinical note transcription with the patient, who gave verbal consent to proceed.  History of Present Illness   Gilbert Obrien is a 78 year old male with hypertension and type 2 diabetes who presents for follow-up on blood pressure and blood sugar management.  He has been actively working on dietary modifications, specifically reducing sweets and sugary foods. He avoids sugary drinks, choosing water, coffee, and occasionally a zero-calorie soft drink. However, he finds some recipes too complicated, which challenges his dietary changes.  He is currently on olmesartan. He has attempted to purchase a blood pressure cuff but has been unable to find one locally.  Regarding his diabetes, he recalls a previous blood sugar level of 500 mg/dL, which has since decreased. He owns a glucometer for emergencies but has not used it regularly. He is unfamiliar with downloading apps on his smartphone, which may limit his ability to use certain glucose monitoring technologies.      Relevant past medical, surgical, family and social history reviewed and updated as indicated. Interim medical history since our last visit reviewed. Allergies and medications reviewed and updated.  ROS per HPI unless specifically indicated above     Objective:    BP (!) 155/82   Pulse 67   Temp 97.7 F (36.5 C) (Oral)   Ht 5' 5 (1.651 m)   Wt 165 lb 12.8 oz (75.2 kg)   SpO2 97%   BMI 27.59 kg/m   Wt Readings from Last 3 Encounters:  07/15/24 165 lb 12.8 oz (75.2 kg)  07/02/24 168 lb (76.2 kg)  06/24/24 168 lb  6.4 oz (76.4 kg)     Physical Exam Constitutional:      Appearance: Normal appearance.  Pulmonary:     Effort: Pulmonary effort is normal.  Musculoskeletal:        General: Normal range of motion.  Skin:    Comments: Normal skin color  Neurological:     General: No focal deficit present.     Mental Status: He is alert. Mental status is at baseline.  Psychiatric:        Mood and Affect: Mood normal.        Behavior: Behavior normal.        Thought Content: Thought content normal.         06/24/2024    3:27 PM 07/20/2016    6:11 PM 05/11/2016    1:43 PM  Depression screen PHQ 2/9  Decreased Interest 1 1 1   Down, Depressed, Hopeless 1 1 1   PHQ - 2 Score 2 2 2   Altered sleeping 0 2 1  Tired, decreased energy 1 2 1   Change in appetite 1 1 0  Feeling bad or failure about yourself  0 1 0  Trouble concentrating 0 1 0  Moving slowly or fidgety/restless 0 0 0  Suicidal thoughts 0 0  0   PHQ-9 Score 4 9 4   Difficult doing  work/chores Somewhat difficult Somewhat difficult Somewhat difficult     Data saved with a previous flowsheet row definition       06/24/2024    3:28 PM  GAD 7 : Generalized Anxiety Score  Nervous, Anxious, on Edge 0  Control/stop worrying 0  Worry too much - different things 0  Trouble relaxing 0  Restless 0  Easily annoyed or irritable 0  Afraid - awful might happen 0  Total GAD 7 Score 0  Anxiety Difficulty Not difficult at all       Assessment & Plan:  Assessment & Plan   Hypertension associated with diabetes (HCC) Assessment & Plan: Blood pressure slightly above target. Current management includes olmesartan, which will be increased. - Increase olmesartan to 40 mg. - Prescribe blood pressure cuff for home monitoring. - Print directions to medical supply store for blood pressure cuff.   Orders: -     Olmesartan Medoxomil; Take 1 tablet (40 mg total) by mouth daily.  Dispense: 90 tablet; Refill: 3  Type 2 diabetes mellitus with diabetic  microalbuminuria, without long-term current use of insulin  (HCC) Assessment & Plan: Blood glucose levels improved. Emphasized diet and home glucose monitoring to prevent complications. - Provide glucometer for home monitoring. - Educate on glucometer use. - Encourage dietary modifications to reduce sugar intake.      Follow up plan: Return in about 4 weeks (around 08/12/2024) for HTN.  Hadassah SHAUNNA Nett, MD

## 2024-07-18 ENCOUNTER — Ambulatory Visit: Admitting: Pediatrics

## 2024-07-21 ENCOUNTER — Encounter: Payer: Self-pay | Admitting: Pediatrics

## 2024-07-21 NOTE — Assessment & Plan Note (Signed)
 Blood pressure slightly above target. Current management includes olmesartan, which will be increased. - Increase olmesartan to 40 mg. - Prescribe blood pressure cuff for home monitoring. - Print directions to medical supply store for blood pressure cuff.

## 2024-07-21 NOTE — Assessment & Plan Note (Signed)
 Blood glucose levels improved. Emphasized diet and home glucose monitoring to prevent complications. - Provide glucometer for home monitoring. - Educate on glucometer use. - Encourage dietary modifications to reduce sugar intake.

## 2024-07-24 ENCOUNTER — Other Ambulatory Visit: Payer: Self-pay | Admitting: Pediatrics

## 2024-07-24 ENCOUNTER — Ambulatory Visit

## 2024-07-24 NOTE — Telephone Encounter (Signed)
 Copied from CRM #[id number]. Topic: Clinical - Medication Refill >> Jul 24, 2024  9:47 AM Winona R wrote: Medication: buPROPion  (WELLBUTRIN  XL) 150 MG 24 hr tablet  Has the patient contacted their pharmacy? No (Agent: If no, request that the patient contact the pharmacy for the refill. If patient does not wish to contact the pharmacy document the reason why and proceed with request.) (Agent: If yes, when and what did the pharmacy advise?)  This is the patient's preferred pharmacy:  [facility] [address] ([address]), [place] - [address] [place]) [place] [address] Phone: [phone] Fax: [phone]  Is this the correct pharmacy for this prescription? Yes If no, delete pharmacy and type the correct one.   Has the prescription been filled recently? Yes  Is the patient out of the medication? Yes  Has the patient been seen for an appointment in the last year OR does the patient have an upcoming appointment? Yes  Can we respond through MyChart? No  Agent: Please be advised that Rx refills may take up to 3 business days. We ask that you follow-up with your pharmacy.

## 2024-07-25 NOTE — Telephone Encounter (Signed)
 Requested medication (s) are due for refill today: yes  Requested medication (s) are on the active medication list: yes  Last refill:  04/09/23  Future visit scheduled: {Yes  Notes to clinic:  Unable to refill per protocol, last refill by another provider.      Requested Prescriptions  Pending Prescriptions Disp Refills   buPROPion  (WELLBUTRIN  XL) 150 MG 24 hr tablet 90 tablet 3    Sig: Take 1 tablet (150 mg total) by mouth every morning.     Psychiatry: Antidepressants - bupropion  Failed - 07/25/2024  3:54 PM      Failed - Cr in normal range and within 360 days    Creat  Date Value Ref Range Status  08/10/2016 1.47 (H) 0.70 - 1.18 mg/dL Final    Comment:      For patients > or = 79 years of age: The upper reference limit for Creatinine is approximately 13% higher for people identified as African-American.      Creatinine, Ser  Date Value Ref Range Status  07/02/2024 1.44 (H) 0.76 - 1.27 mg/dL Final         Failed - Last BP in normal range    BP Readings from Last 1 Encounters:  07/15/24 (!) 155/82         Passed - AST in normal range and within 360 days    AST  Date Value Ref Range Status  07/02/2024 21 0 - 40 IU/L Final         Passed - ALT in normal range and within 360 days    ALT  Date Value Ref Range Status  07/02/2024 36 0 - 44 IU/L Final         Passed - Completed PHQ-2 or PHQ-9 in the last 360 days      Passed - Valid encounter within last 6 months    Recent Outpatient Visits           1 week ago Hypertension associated with diabetes [facility])   [facility] Herold Hadassah SQUIBB, MD   3 weeks ago Type 2 diabetes mellitus with stage 3a chronic kidney disease, without long-term current use of insulin  [facility])   [facility] Herold Hadassah SQUIBB, MD   1 month ago Essential hypertension   [facility] Herold Hadassah SQUIBB, MD

## 2024-07-29 MED ORDER — BUPROPION HCL ER (XL) 150 MG PO TB24
150.0000 mg | ORAL_TABLET | Freq: Every morning | ORAL | 3 refills | Status: AC
Start: 2024-07-29 — End: ?

## 2024-08-05 ENCOUNTER — Ambulatory Visit: Admitting: Pediatrics

## 2024-08-12 ENCOUNTER — Ambulatory Visit: Admitting: Pediatrics

## 2024-08-12 ENCOUNTER — Encounter: Payer: Self-pay | Admitting: Pediatrics

## 2024-08-12 VITALS — BP 152/78 | HR 99 | Ht 65.0 in | Wt 165.4 lb

## 2024-08-12 DIAGNOSIS — E1159 Type 2 diabetes mellitus with other circulatory complications: Secondary | ICD-10-CM | POA: Diagnosis not present

## 2024-08-12 DIAGNOSIS — R809 Proteinuria, unspecified: Secondary | ICD-10-CM | POA: Diagnosis not present

## 2024-08-12 DIAGNOSIS — Z7984 Long term (current) use of oral hypoglycemic drugs: Secondary | ICD-10-CM

## 2024-08-12 DIAGNOSIS — I152 Hypertension secondary to endocrine disorders: Secondary | ICD-10-CM | POA: Diagnosis not present

## 2024-08-12 DIAGNOSIS — E1129 Type 2 diabetes mellitus with other diabetic kidney complication: Secondary | ICD-10-CM | POA: Diagnosis not present

## 2024-08-12 MED ORDER — HYDROCHLOROTHIAZIDE 12.5 MG PO CAPS: 12.5000 mg | ORAL_CAPSULE | Freq: Every day | ORAL | 3 refills | Status: DC

## 2024-08-12 MED ORDER — GLIPIZIDE ER 10 MG PO TB24: 10.0000 mg | ORAL_TABLET | Freq: Every day | ORAL | 0 refills | Status: AC

## 2024-08-12 NOTE — Assessment & Plan Note (Addendum)
 Blood glucose levels variable. Has remained mostly under 200s. Prefers dietary management over additional medications. Continuing glipizide . - Continue glipizide  with 90-day supply. - Encouraged dietary modifications and increased physical activity. - Scheduled follow-up in 3-4 weeks to reassess blood glucose.

## 2024-08-12 NOTE — Assessment & Plan Note (Signed)
 Blood pressure remains elevated on amlodipine  and olmesartan. Additional medication needed. - Added hydrochlorothiazide to regimen. - Prescribed 30-day supply of hydrochlorothiazide. - Scheduled follow-up in 3-4 weeks to reassess blood pressure.

## 2024-08-12 NOTE — Progress Notes (Signed)
 Office Visit  BP (!) 152/78 (BP Location: Left Arm, Patient Position: Sitting, Cuff Size: Normal)   Pulse 99   Ht 5' 5 (1.651 m)   Wt 165 lb 6.4 oz (75 kg)   SpO2 99%   BMI 27.52 kg/m    Subjective:    Patient ID: Gilbert Obrien, male    DOB: 21-May-1946, 78 y.o.   MRN: [id number]  HPI: Gilbert Obrien is a 78 y.o. male  Chief Complaint  Patient presents with   Medication Refill    Glipizide      Discussed the use of AI scribe software for clinical note transcription with the patient, who gave verbal consent to proceed.  History of Present Illness   Gilbert Obrien is a 78 year old male with hypertension and diabetes who presents for blood pressure management and diabetes follow-up.  His blood pressure remains elevated despite current treatment with amlodipine  and olmesartan. He has a history of a heart attack and has had previous adjustments to his medication regimen to achieve optimal blood pressure control.  A recent blood sugar reading was 224 mg/dL, although he has seen it as low as 160 mg/dL. His blood sugar tends to rise with dietary changes. He is currently taking glipizide  and wants to manage his condition through diet and exercise. He plans to increase his physical activity by visiting the [facility], despite challenges due to knee and shoulder issues.  He mentions a recent illness where he felt unwell and missed a previous appointment. Symptoms were consistent with a cold, including coughing and nasal congestion, but no fever. He suspects it might have been COVID-19, as he has had it before, but he is feeling better now. He continues to experience some cough and nasal congestion, with occasional blood-tinged mucus and sinus tenderness.  In terms of social history, he is dealing with legal matters following his mother's passing, which has impacted his schedule. He also takes precautions to avoid illness by wearing a mask and avoiding crowded places.        Relevant  past medical, surgical, family and social history reviewed and updated as indicated. Interim medical history since our last visit reviewed. Allergies and medications reviewed and updated.  ROS per HPI unless specifically indicated above     Objective:    BP (!) 152/78 (BP Location: Left Arm, Patient Position: Sitting, Cuff Size: Normal)   Pulse 99   Ht 5' 5 (1.651 m)   Wt 165 lb 6.4 oz (75 kg)   SpO2 99%   BMI 27.52 kg/m   Wt Readings from Last 3 Encounters:  08/12/24 165 lb 6.4 oz (75 kg)  07/15/24 165 lb 12.8 oz (75.2 kg)  07/02/24 168 lb (76.2 kg)     Physical Exam Constitutional:      Appearance: Normal appearance.  Pulmonary:     Effort: Pulmonary effort is normal.  Musculoskeletal:        General: Normal range of motion.  Skin:    Comments: Normal skin color  Neurological:     General: No focal deficit present.     Mental Status: He is alert. Mental status is at baseline.  Psychiatric:        Mood and Affect: Mood normal.        Behavior: Behavior normal.        Thought Content: Thought content normal.         08/12/2024    1:11 PM 06/24/2024    3:27 PM 07/20/2016  6:11 PM 05/11/2016    1:43 PM  Depression screen PHQ 2/9  Decreased Interest 0 1 1 1   Down, Depressed, Hopeless 1 1 1 1   PHQ - 2 Score 1 2 2 2   Altered sleeping 1 0 2 1  Tired, decreased energy 1 1 2 1   Change in appetite 0 1 1 0  Feeling bad or failure about yourself  0 0 1 0  Trouble concentrating 0 0 1 0  Moving slowly or fidgety/restless 0 0 0 0  Suicidal thoughts 0 0 0  0   PHQ-9 Score 3 4  9  4    Difficult doing work/chores  Somewhat difficult Somewhat difficult Somewhat difficult     Data saved with a previous flowsheet row definition       08/12/2024    1:10 PM 06/24/2024    3:28 PM  GAD 7 : Generalized Anxiety Score  Nervous, Anxious, on Edge 0 0  Control/stop worrying 0 0  Worry too much - different things 0 0  Trouble relaxing 0 0  Restless 0 0  Easily annoyed or  irritable 0 0  Afraid - awful might happen 0 0  Total GAD 7 Score 0 0  Anxiety Difficulty  Not difficult at all       Assessment & Plan:  Assessment & Plan   Type 2 diabetes mellitus with diabetic microalbuminuria, without long-term current use of insulin  (HCC) Assessment & Plan: Blood glucose levels variable. Has remained mostly under 200s. Prefers dietary management over additional medications. Continuing glipizide . - Continue glipizide  with 90-day supply. - Encouraged dietary modifications and increased physical activity. - Scheduled follow-up in 3-4 weeks to reassess blood glucose.   Orders: -     glipiZIDE  ER; Take 1 tablet (10 mg total) by mouth daily.  Dispense: 90 tablet; Refill: 0  Hypertension associated with diabetes (HCC) Assessment & Plan: Blood pressure remains elevated on amlodipine  and olmesartan. Additional medication needed. - Added hydrochlorothiazide to regimen. - Prescribed 30-day supply of hydrochlorothiazide. - Scheduled follow-up in 3-4 weeks to reassess blood pressure.  Orders: -     hydroCHLOROthiazide; Take 1 capsule (12.5 mg total) by mouth daily.  Dispense: 30 capsule; Refill: 3      Follow up plan: Return in about 3 weeks (around 09/02/2024) for HTN.  Hadassah SHAUNNA Nett, MD

## 2024-08-12 NOTE — Patient Instructions (Signed)
 Adding hydrochlorothiazide 12.5mg  for blood pressure Continue amlodipine  10mg  and olmesartan 40mg 

## 2024-08-20 ENCOUNTER — Telehealth: Payer: Self-pay | Admitting: Pediatrics

## 2024-08-20 NOTE — Telephone Encounter (Unsigned)
 Copied from CRM #[id number]. Topic: Clinical - Medication Refill >> Aug 20, 2024 10:03 AM Amy B wrote: Medication: temazepam  (RESTORIL ) 15 MG capsule  Has the patient contacted their pharmacy? No (Agent: If no, request that the patient contact the pharmacy for the refill. If patient does not wish to contact the pharmacy document the reason why and proceed with request.) (Agent: If yes, when and what did the pharmacy advise?)  This is the patient's preferred pharmacy:  [facility] [address]), [place] - [address] [place]) [place] [address] Phone: [phone] Fax: [phone]  Is this the correct pharmacy for this prescription? Yes If no, delete pharmacy and type the correct one.   Has the prescription been filled recently? No  Is the patient out of the medication? Yes  Has the patient been seen for an appointment in the last year OR does the patient have an upcoming appointment? Yes  Can we respond through MyChart? No  Agent: Please be advised that Rx refills may take up to 3 business days. We ask that you follow-up with your pharmacy.

## 2024-09-04 ENCOUNTER — Encounter: Payer: Self-pay | Admitting: Pediatrics

## 2024-09-04 ENCOUNTER — Ambulatory Visit: Admitting: Pediatrics

## 2024-09-04 ENCOUNTER — Ambulatory Visit

## 2024-09-04 VITALS — BP 148/79 | HR 61 | Temp 97.6°F | Ht 65.0 in | Wt 166.2 lb

## 2024-09-04 DIAGNOSIS — R809 Proteinuria, unspecified: Secondary | ICD-10-CM | POA: Diagnosis not present

## 2024-09-04 DIAGNOSIS — E1129 Type 2 diabetes mellitus with other diabetic kidney complication: Secondary | ICD-10-CM | POA: Diagnosis not present

## 2024-09-04 DIAGNOSIS — I152 Hypertension secondary to endocrine disorders: Secondary | ICD-10-CM | POA: Diagnosis not present

## 2024-09-04 DIAGNOSIS — Z7984 Long term (current) use of oral hypoglycemic drugs: Secondary | ICD-10-CM

## 2024-09-04 DIAGNOSIS — E785 Hyperlipidemia, unspecified: Secondary | ICD-10-CM | POA: Diagnosis not present

## 2024-09-04 DIAGNOSIS — E1169 Type 2 diabetes mellitus with other specified complication: Secondary | ICD-10-CM | POA: Diagnosis not present

## 2024-09-04 DIAGNOSIS — E1159 Type 2 diabetes mellitus with other circulatory complications: Secondary | ICD-10-CM | POA: Diagnosis not present

## 2024-09-04 LAB — MICROALBUMIN, URINE WAIVED
Creatinine, Urine Waived: 50 mg/dL (ref 10–300)
Microalb, Ur Waived: 80 mg/L — ABNORMAL HIGH (ref 0–19)

## 2024-09-04 MED ORDER — OLMESARTAN-AMLODIPINE-HCTZ 40-10-25 MG PO TABS: 1.0000 | ORAL_TABLET | Freq: Every day | ORAL | 1 refills | Status: DC

## 2024-09-04 NOTE — Progress Notes (Unsigned)
 Office Visit  BP (!) 148/79 (BP Location: Right Arm, Cuff Size: Normal)   Pulse 61   Temp 97.6 F (36.4 C) (Oral)   Ht 5' 5 (1.651 m)   Wt 166 lb 3.2 oz (75.4 kg)   SpO2 98%   BMI 27.66 kg/m    Subjective:    Patient ID: [name], male    DOB: 1946/06/05, 78 y.o.   MRN: [id number]  HPI: KAUSHIK MAUL is a 78 y.o. male  Chief Complaint  Patient presents with   office visit    3-week F/u.     Discussed the use of AI scribe software for clinical note transcription with the patient, who gave verbal consent to proceed.  History of Present Illness   SIRRON FRANCESCONI is a 78 year old male with diabetes and hypertension who presents with concerns about blood sugar management and joint pain.  He has been experiencing elevated blood sugar levels despite taking glipizide  as his only diabetes medication. He monitors his blood sugar every morning before eating and noticed that after working out and eating breakfast, his blood sugar was ten degrees higher than before. He has not been able to work out this week due to a busy schedule. He is concerned about his blood sugar levels, especially given his family history of pancreatic issues, as a cousin had a problem with her pancreas and passed away.  He mentions experiencing sudden erectile dysfunction over the past two to three weeks, which he finds unusual given his previously high sex drive. He is concerned about the potential link between his erectile dysfunction and his heart, as he has a history of a heart attack.  He describes significant joint pain in his knees and shoulders, which has worsened over the last few days, possibly due to cold weather. The pain is severe enough that he sometimes struggles to stand up from a chair and had difficulty walking while shopping. He has tried Tylenol  without relief and is hesitant to use stronger pain medications like oxycodone .  He is aware of his stage three kidney disease. He  expresses concern about the progression of his kidney condition and its impact on his overall health.     Relevant past medical, surgical, family and social history reviewed and updated as indicated. Interim medical history since our last visit reviewed. Allergies and medications reviewed and updated.  ROS per HPI unless specifically indicated above     Objective:    BP (!) 148/79 (BP Location: Right Arm, Cuff Size: Normal)   Pulse 61   Temp 97.6 F (36.4 C) (Oral)   Ht 5' 5 (1.651 m)   Wt 166 lb 3.2 oz (75.4 kg)   SpO2 98%   BMI 27.66 kg/m   Wt Readings from Last 3 Encounters:  09/04/24 166 lb 3.2 oz (75.4 kg)  08/12/24 165 lb 6.4 oz (75 kg)  07/15/24 165 lb 12.8 oz (75.2 kg)     Physical Exam Constitutional:      Appearance: Normal appearance.  Pulmonary:     Effort: Pulmonary effort is normal.  Musculoskeletal:        General: Normal range of motion.  Skin:    Comments: Normal skin color  Neurological:     General: No focal deficit present.     Mental Status: He is alert. Mental status is at baseline.  Psychiatric:        Mood and Affect: Mood normal.  Behavior: Behavior normal.        Thought Content: Thought content normal.         09/04/2024    2:10 PM 08/12/2024    1:11 PM 06/24/2024    3:27 PM 07/20/2016    6:11 PM 05/11/2016    1:43 PM  Depression screen PHQ 2/9  Decreased Interest 1 0 1 1 1   Down, Depressed, Hopeless 1 1 1 1 1   PHQ - 2 Score 2 1 2 2 2   Altered sleeping 1 1 0 2 1  Tired, decreased energy 1 1 1 2 1   Change in appetite 0 0 1 1 0  Feeling bad or failure about yourself  0 0 0 1 0  Trouble concentrating 0 0 0 1 0  Moving slowly or fidgety/restless 0 0 0 0 0  Suicidal thoughts 0 0 0 0  0   PHQ-9 Score 4 3 4  9  4    Difficult doing work/chores Not difficult at all  Somewhat difficult Somewhat difficult Somewhat difficult     Data saved with a previous flowsheet row definition       09/04/2024    2:11 PM 08/12/2024    1:10  PM 06/24/2024    3:28 PM  GAD 7 : Generalized Anxiety Score  Nervous, Anxious, on Edge 1 0 0  Control/stop worrying 1 0 0  Worry too much - different things 1 0 0  Trouble relaxing 1 0 0  Restless 0 0 0  Easily annoyed or irritable 0 0 0  Afraid - awful might happen 0 0 0  Total GAD 7 Score 4 0 0  Anxiety Difficulty Not difficult at all  Not difficult at all       Assessment & Plan:  Assessment & Plan   Type 2 diabetes mellitus with diabetic microalbuminuria, without long-term current use of insulin  (HCC) -     Basic metabolic panel with GFR -     Hemoglobin A1c -     Microalbumin, Urine Waived  Hyperlipidemia associated with type 2 diabetes mellitus (HCC)  Hypertension associated with diabetes (HCC) -     Olmesartan -amLODIPine -HCTZ; Take 1 tablet by mouth daily.  Dispense: 90 tablet; Refill: 1     Assessment and Plan    Type 2 diabetes mellitus with diabetic kidney complication Type 2 diabetes with elevated blood glucose and stage 3 diabetic kidney disease. Erectile dysfunction likely related to diabetes. Discussed potential need for additional diabetes medication and impact on kidney function. - Checked blood sugar and labs today. - Discussed potential addition of diabetes medication if blood sugar remains high. - Discussed potential use of injectable diabetes medication (pen) if needed. - Discussed medication for kidney protection if kidney function worsens.  Hypertension associated with diabetes Plan to simplify medication regimen. - Increased dose of hydrochlorothiazide  in combination pill. - Discontinued other two antihypertensive medications.  Erectile dysfunction due to diabetes Erectile dysfunction likely secondary to diabetes and elevated blood sugar. Discussed impact on blood flow and erectile function. - Focus on lowering blood sugar levels to improve erectile dysfunction. - Discussed potential use of Viagra for erectile dysfunction.  Chronic joint  pain Significant pain in knees and shoulders affecting mobility. Tylenol  ineffective. Discussed meloxicam for pain and inflammation. - Prescribed meloxicam for joint pain.         Follow up plan: Return in about 4 weeks (around 10/02/2024) for DM, HTN.  Hadassah SHAUNNA Nett, MD

## 2024-09-04 NOTE — Patient Instructions (Signed)
 I am combining all your blood pressure medications into 1: amlodipine -olmesartan  hydrochlorothiazide  (do not take individual pills). I sent this to your pharmacy.  We will check labs and decide what medication to take  Don't forget to schedule a visit with your heart doctor soon

## 2024-09-05 LAB — BASIC METABOLIC PANEL WITH GFR
BUN/Creatinine Ratio: 18 (ref 10–24)
BUN: 32 mg/dL — ABNORMAL HIGH (ref 8–27)
CO2: 24 mmol/L (ref 20–29)
Calcium: 9.2 mg/dL (ref 8.6–10.2)
Chloride: 99 mmol/L (ref 96–106)
Creatinine, Ser: 1.74 mg/dL — ABNORMAL HIGH (ref 0.76–1.27)
Glucose: 369 mg/dL — ABNORMAL HIGH (ref 70–99)
Potassium: 4.2 mmol/L (ref 3.5–5.2)
Sodium: 136 mmol/L (ref 134–144)
eGFR: 39 mL/min/1.73 — ABNORMAL LOW (ref >59)

## 2024-09-05 LAB — HEMOGLOBIN A1C
Est. average glucose Bld gHb Est-mCnc: 243 mg/dL
Hgb A1c MFr Bld: 10.1 % — ABNORMAL HIGH (ref 4.8–5.6)

## 2024-09-09 ENCOUNTER — Ambulatory Visit: Payer: Self-pay | Admitting: Pediatrics

## 2024-09-10 ENCOUNTER — Telehealth: Payer: Self-pay | Admitting: Pharmacy Technician

## 2024-09-10 ENCOUNTER — Other Ambulatory Visit (HOSPITAL_COMMUNITY): Payer: Self-pay

## 2024-09-10 NOTE — Telephone Encounter (Signed)
 Pharmacy Patient Advocate Encounter   Received notification from Onbase that prior authorization for Olmesartan -amLODIPine -HCTZ 40-10-25MG  tablets is required/requested.   Insurance verification completed.   The patient is insured through [facility] ADVANTAGE/RX ADVANCE.   Per test claim: PA required; PA submitted to above mentioned insurance via Latent Key/confirmation #/EOC [id number] Status is pending

## 2024-09-11 ENCOUNTER — Other Ambulatory Visit (HOSPITAL_COMMUNITY): Payer: Self-pay

## 2024-09-11 ENCOUNTER — Other Ambulatory Visit: Payer: Self-pay | Admitting: Pediatrics

## 2024-09-11 DIAGNOSIS — E1159 Type 2 diabetes mellitus with other circulatory complications: Secondary | ICD-10-CM

## 2024-09-11 MED ORDER — AMLODIPINE-OLMESARTAN 10-40 MG PO TABS: 1.0000 | ORAL_TABLET | Freq: Every day | ORAL | 3 refills | Status: DC

## 2024-09-11 MED ORDER — HYDROCHLOROTHIAZIDE 25 MG PO TABS: 25.0000 mg | ORAL_TABLET | Freq: Every day | ORAL | 3 refills | Status: AC

## 2024-09-11 NOTE — Telephone Encounter (Signed)
 Pharmacy Patient Advocate Encounter  Received notification from [facility] ADVANTAGE/RX ADVANCE that Prior Authorization for Olmesartan -amLODIPine -HCTZ 40-10-25MG  tablets has been APPROVED from 09/10/2024 to 09/10/2025. Ran test claim, Copay is $100.00/30 day supply and $250.00/90 day supply. This test claim was processed through [facility]- copay amounts may vary at other pharmacies due to pharmacy/plan contracts, or as the patient moves through the different stages of their insurance plan.   PA #/Case ID/Reference #: [id number]   It is more cost effective for the patient to be prescribed 2 separate prescriptions. One order for the amlodipine -olmesartan  10-40mg  tablets and  then a separate order the hydrochlorothiazide  25mg  tablets. Please advise.

## 2024-09-11 NOTE — Telephone Encounter (Signed)
 Spoke with patient. He is aware of the update. He does however still have plenty of the 12.5 hydrochlorothiazide  and will double up on those for now until he needs to fill the 25 mg tablet.   He does wonder if PCP would send an rx in for Mexloicam for knee pain. Advised I would request from PCP and update when able.

## 2024-09-11 NOTE — Progress Notes (Signed)
 Splitting combo pill to azor  and hydrochlorothiazide  due to cost.   Gilbert SHAUNNA Nett, MD

## 2024-09-12 ENCOUNTER — Other Ambulatory Visit: Payer: Self-pay | Admitting: Pediatrics

## 2024-09-12 ENCOUNTER — Encounter: Payer: Self-pay | Admitting: Pediatrics

## 2024-09-12 DIAGNOSIS — G8929 Other chronic pain: Secondary | ICD-10-CM

## 2024-09-12 MED ORDER — MELOXICAM 15 MG PO TABS: 15.0000 mg | ORAL_TABLET | Freq: Every day | ORAL | 1 refills | Status: AC

## 2024-09-12 NOTE — Assessment & Plan Note (Signed)
 On atorvastatin 80 mg daily

## 2024-09-12 NOTE — Assessment & Plan Note (Signed)
 Type 2 diabetes with elevated blood glucose and stage 3 diabetic kidney disease. Erectile dysfunction likely related to diabetes. Discussed potential need for additional diabetes medication and impact on kidney function. He declined pharmacologic treatment in the past but is amenable if sugars still high on repeat blood work. - Checked blood sugar and labs today. - Discussed potential addition of diabetes medication if blood sugar remains high. - Discussed potential use of injectable diabetes medication (pen) if needed. - Discussed medication for kidney protection if kidney function worsens.

## 2024-09-12 NOTE — Progress Notes (Signed)
 Sent meloxicam for knee pain as requested.  Hadassah SHAUNNA Nett, MD

## 2024-09-12 NOTE — Assessment & Plan Note (Signed)
 Plan to simplify medication regimen. - Increased dose of hydrochlorothiazide  in combination pill. - Discontinued other two antihypertensive medications.

## 2024-09-16 ENCOUNTER — Telehealth: Payer: Self-pay | Admitting: Pediatrics

## 2024-09-16 NOTE — Telephone Encounter (Signed)
 Copied from CRM [id number]. Topic: Clinical - Medical Advice >> Sep 16, 2024  1:03 PM Amy B wrote: Reason for CRM: Patient needs to have his Herlene III sensor set up.  The last one he had was recalled so he received a new one but does not know how to set it up.  Also, he requests a call back to discuss his medications.  He is not sure what he is supposed to be taking.  Please call  [phone]

## 2024-09-17 ENCOUNTER — Ambulatory Visit

## 2024-09-17 ENCOUNTER — Other Ambulatory Visit: Payer: Self-pay

## 2024-09-17 DIAGNOSIS — R809 Proteinuria, unspecified: Secondary | ICD-10-CM

## 2024-09-17 MED ORDER — CARVEDILOL 25 MG PO TABS: ORAL_TABLET | ORAL | 0 refills | Status: AC

## 2024-09-17 NOTE — Progress Notes (Signed)
 Patient seen in office for placement of Freestyle Libre 3 CGM sensor due to previous being recalled by [employer]. Sensor has been placed and sensor connected to patients cell phone. He is aware sensor will start reading in 60 minutes and then at that time can continue monitoring his sugars. He tolerated placement well with no additional questions or concerns.

## 2024-09-17 NOTE — Telephone Encounter (Signed)
 Scheduled for nurse visit this morning. Patient aware.

## 2024-09-30 ENCOUNTER — Ambulatory Visit (INDEPENDENT_AMBULATORY_CARE_PROVIDER_SITE_OTHER): Admitting: Emergency Medicine

## 2024-09-30 VITALS — Ht 65.0 in | Wt 165.0 lb

## 2024-09-30 DIAGNOSIS — Z0000 Encounter for general adult medical examination without abnormal findings: Secondary | ICD-10-CM | POA: Diagnosis not present

## 2024-09-30 NOTE — Progress Notes (Signed)
 "  Chief Complaint  Patient presents with   Medicare Wellness     Subjective:   Gilbert Obrien is a 79 y.o. male who presents for a Medicare Annual Wellness Visit.  Visit info / Clinical Intake: Medicare Wellness Visit Type:: Subsequent Annual Wellness Visit Persons participating in visit and providing information:: patient Medicare Wellness Visit Mode:: Telephone If telephone:: video declined Since this visit was completed virtually, some vitals may be partially provided or unavailable. Missing vitals are due to the limitations of the virtual format.: Documented vitals are patient reported If Telephone or Video please confirm:: I connected with patient using audio/video enable telemedicine. I verified patient identity with two identifiers, discussed telehealth limitations, and patient agreed to proceed. Patient Location:: home Provider Location:: clinic Interpreter Needed?: No Pre-visit prep was completed: yes AWV questionnaire completed by patient prior to visit?: no Living arrangements:: (!) lives alone Patient's Overall Health Status Rating: good Typical amount of pain: (!) a lot Does pain affect daily life?: (!) yes Are you currently prescribed opioids?: no  Dietary Habits and Nutritional Risks How many meals a day?: 2 Eats fruit and vegetables daily?: yes (mostly vegetables) Most meals are obtained by: preparing own meals In the last 2 weeks, have you had any of the following?: none Diabetic:: (!) yes Any non-healing wounds?: no How often do you check your BS?: continuous glucose monitor Would you like to be referred to a Nutritionist or for Diabetic Management? : no  Functional Status Activities of Daily Living (to include ambulation/medication): Independent Ambulation: Independent Medication Administration: Independent Home Management (perform basic housework or laundry): Independent Manage your own finances?: yes Primary transportation is: driving Concerns  about vision?: (!) yes (Needs DM eye exam) Concerns about hearing?: no  Fall Screening Falls in the past year?: 1 Number of falls in past year: 1 Was there an injury with Fall?: 0 Fall Risk Category Calculator: 2 Patient Fall Risk Level: Moderate Fall Risk  Fall Risk Patient at Risk for Falls Due to: History of fall(s); Impaired balance/gait Fall risk Follow up: Falls evaluation completed; Education provided; Falls prevention discussed  Home and Transportation Safety: All rugs have non-skid backing?: yes All stairs or steps have railings?: (!) no (3 steps without a handrail, other steps with handrail) Grab bars in the bathtub or shower?: (!) no Have non-skid surface in bathtub or shower?: yes Good home lighting?: yes Regular seat belt use?: yes Hospital stays in the last year:: no  Cognitive Assessment Difficulty concentrating, remembering, or making decisions? : no Will 6CIT or Mini Cog be Completed: yes What year is it?: 0 points What month is it?: 0 points Give patient an address phrase to remember (5 components): [address] [place] About what time is it?: 0 points Count backwards from 20 to 1: 0 points Say the months of the year in reverse: 0 points Repeat the address phrase from earlier: 0 points 6 CIT Score: 0 points  Advance Directives (For Healthcare) Does Patient Have a Medical Advance Directive?: No Would patient like information on creating a medical advance directive?: No - Patient declined  Reviewed/Updated  Reviewed/Updated: Reviewed All (Medical, Surgical, Family, Medications, Allergies, Care Teams, Patient Goals)    Allergies (verified) Celecoxib and Sulfa antibiotics   Current Medications (verified) Outpatient Encounter Medications as of 09/30/2024  Medication Sig   Alcohol  Swabs  PADS Use with diabetic supplies to clean finger before stick   amLODipine -olmesartan  (AZOR ) 10-40 MG tablet Take 1 tablet by mouth daily.   aspirin   EC 81 MG tablet Take  1 tablet (81 mg total) by mouth daily.   atorvastatin  (LIPITOR ) 80 MG tablet Take 1 tablet (80 mg total) by mouth at bedtime for cholesterol.   buPROPion  (WELLBUTRIN  XL) 150 MG 24 hr tablet Take 1 tablet (150 mg total) by mouth every morning.   carvedilol  (COREG ) 25 MG tablet TAKE 1 TABLET IN THE MORNING AND AT BEDTIME (PLEASE CALL TO SCHEDULE APPOINTMENT WITH MD FOR FURTHER REFILLS)   cholecalciferol (VITAMIN D3) 25 MCG (1000 UNIT) tablet Take 1,000 Units by mouth daily.   Continuous Glucose Sensor (FREESTYLE LIBRE 3 PLUS SENSOR) MISC by Does not apply route. Change sensor every 15 days.   glipiZIDE  (GLUCOTROL  XL) 10 MG 24 hr tablet Take 1 tablet (10 mg total) by mouth daily.   hydrochlorothiazide  (HYDRODIURIL ) 25 MG tablet Take 1 tablet (25 mg total) by mouth daily.   meloxicam  (MOBIC ) 15 MG tablet Take 1 tablet (15 mg total) by mouth daily.   Multiple Vitamins-Minerals (ONE-A-DAY MENS 50+) TABS Take 1 tablet by mouth daily.   temazepam  (RESTORIL ) 15 MG capsule Take 1 capsule (15 mg total) by mouth at bedtime as needed for sleep.   Zinc  50 MG TABS Take 1 tablet (50 mg total) by mouth daily.   glucose blood test strip Use as instructed (Patient not taking: Reported on 09/30/2024)   Lancets (ONETOUCH ULTRASOFT) lancets Use as instructed (Patient not taking: Reported on 09/30/2024)   No facility-administered encounter medications on file as of 09/30/2024.    History: Past Medical History:  Diagnosis Date   Abdominal pain    Acute cholecystitis 03/25/2020   Acute respiratory failure with hypoxia (HCC) 06/28/2020   Arthritis    Cholecystitis 08/15/2016   Chronic kidney disease    per patient has stage 3 kidney failure    Coronary artery disease    a. STEMI 03/2016 DESx1 to LCx, DES x 1 Mid LAD, DES x1 prox RCA   COVID-19 virus infection 06/24/2020   Depression    Diabetes mellitus without complication (HCC)    type 2   Dyspnea    Hematoma of arm 04/19/2016   History of acute inferior wall  myocardial infarction 04/15/2016   inf-lat/post >> PCI with DES of LCx; staged PCI of LAD and RCA   Hypercholesteremia    Hypertension    Hypokalemia 04/19/2016   Ischemic cardiomyopathy 04/19/2016   A. Inf-lat/post STEMI 7/17 >> b. Echo 04/17/16: Septal and post lateral HK, poor image quality, EF 35-40% // b. Echo 11/17: mild LVH, EF 50-55, inf-lat and ant-lat HK, Gr 1 DD, borderline dilated aortic root (37 mm), MAC   Leg pain    ABIs 3/19:  normal   Weakness    Past Surgical History:  Procedure Laterality Date   CARDIAC CATHETERIZATION N/A 04/15/2016   Procedure: Left Heart Cath and Coronary Angiography;  Surgeon: Ozell Fell, MD;  Location: [facility] INVASIVE CV LAB;  Service: Cardiovascular;  Laterality: N/A;   CARDIAC CATHETERIZATION N/A 04/15/2016   Procedure: Coronary Stent Intervention;  Surgeon: Ozell Fell, MD;  Location: [facility] INVASIVE CV LAB;  Service: Cardiovascular;  Laterality: N/A;   CARDIAC CATHETERIZATION N/A 04/17/2016   Procedure: Coronary Stent Intervention;  Surgeon: Lonni JONETTA Cash, MD;  Location: [facility] INVASIVE CV LAB;  Service: Cardiovascular;  Laterality: N/A;   CARDIAC CATHETERIZATION N/A 04/18/2016   Procedure: Coronary Stent Intervention;  Surgeon: Lonni JONETTA Cash, MD;  Location: [facility] INVASIVE CV LAB;  Service: Cardiovascular;  Laterality: N/A;   CARDIAC CATHETERIZATION  N/A 04/18/2016   Procedure: Temporary Pacemaker;  Surgeon: Lonni JONETTA Cash, MD;  Location: [facility] INVASIVE CV LAB;  Service: Cardiovascular;  Laterality: N/A;   COLONOSCOPY WITH PROPOFOL  N/A 10/31/2023   Procedure: COLONOSCOPY WITH PROPOFOL ;  Surgeon: Toledo, Ladell POUR, MD;  Location: [facility] ENDOSCOPY;  Service: Gastroenterology;  Laterality: N/A;   CORONARY STENT PLACEMENT  04/17/2016    Severe stenosis proximal LAD, now s/p successful PTCA/DES x 1 proximal and mid LAD   COSMETIC SURGERY  1974   right side facial        POLYPECTOMY  10/31/2023   Procedure: POLYPECTOMY;  Surgeon: Toledo, Ladell POUR, MD;  Location: [facility] ENDOSCOPY;  Service: Gastroenterology;;   SHOULDER SURGERY     Family History  Problem Relation Age of Onset   Heart attack Brother    Heart disease Brother    Social History   Occupational History   Not on file  Tobacco Use   Smoking status: Former    Current packs/day: 0.00    Average packs/day: 0.8 packs/day for 15.0 years (11.3 ttl pk-yrs)    Types: Cigarettes    Start date: 52    Quit date: 48    Years since quitting: 32.0   Smokeless tobacco: Never  Vaping Use   Vaping status: Never Used  Substance and Sexual Activity   Alcohol  use: No   Drug use: Not Currently    Types: Marijuana   Sexual activity: Not on file   Tobacco Counseling Counseling given: Not Answered  SDOH Screenings   Food Insecurity: No Food Insecurity (09/30/2024)  Housing: Low Risk (09/30/2024)  Transportation Needs: No Transportation Needs (09/30/2024)  Utilities: Not At Risk (09/30/2024)  Depression (PHQ2-9): Low Risk (09/30/2024)  Financial Resource Strain: Medium Risk (08/21/2023)   Received from [facility]  Physical Activity: Inactive (09/30/2024)  Social Connections: Socially Isolated (09/30/2024)  Stress: No Stress Concern Present (09/30/2024)  Tobacco Use: Medium Risk (09/30/2024)  Health Literacy: Adequate Health Literacy (09/30/2024)   See flowsheets for full screening details  Depression Screen PHQ 2 & 9 Depression Scale- Over the past 2 weeks, how often have you been bothered by any of the following problems? Little interest or pleasure in doing things: 0 Feeling down, depressed, or hopeless (PHQ Adolescent also includes...irritable): 0 PHQ-2 Total Score: 0 Trouble falling or staying asleep, or sleeping too much: 1 Feeling tired or having little energy: 1 Poor appetite or overeating (PHQ Adolescent also includes...weight loss): 0 Feeling bad about yourself - or that you are a failure or have let yourself or your family down: 0 Trouble concentrating  on things, such as reading the newspaper or watching television (PHQ Adolescent also includes...like school work): 0 Moving or speaking so slowly that other people could have noticed. Or the opposite - being so fidgety or restless that you have been moving around a lot more than usual: 0 Thoughts that you would be better off dead, or of hurting yourself in some way: 0 PHQ-9 Total Score: 2 If you checked off any problems, how difficult have these problems made it for you to do your work, take care of things at home, or get along with other people?: Not difficult at all  Depression Treatment Depression Interventions/Treatment : EYV7-0 Score <4 Follow-up Not Indicated; Currently on Treatment     Goals Addressed               This Visit's Progress     Get blood sugar under control (pt-stated)  Objective:    Today's Vitals   09/30/24 1136  Weight: 165 lb (74.8 kg)  Height: 5' 5 (1.651 m)   Body mass index is 27.46 kg/m.  Hearing/Vision screen Hearing Screening - Comments:: Denies hearing loss  Vision Screening - Comments:: Needs DM eye exam. Patient to call [facility] and schedule Immunizations and Health Maintenance Health Maintenance  Topic Date Due   COVID-19 Vaccine (1) Never done   FOOT EXAM  Never done   Hepatitis C Screening  Never done   OPHTHALMOLOGY EXAM  08/07/2024   Zoster Vaccines- Shingrix (1 of 2) 12/03/2024 (Originally 05/20/1965)   Influenza Vaccine  12/23/2024 (Originally 04/25/2024)   DTaP/Tdap/Td (1 - Tdap) 09/04/2025 (Originally 05/20/1965)   Pneumococcal Vaccine: 50+ Years (1 of 2 - PCV) 09/04/2025 (Originally 05/20/1965)   HEMOGLOBIN A1C  03/05/2025   Diabetic kidney evaluation - eGFR measurement  09/04/2025   Diabetic kidney evaluation - Urine ACR  09/04/2025   Medicare Annual Wellness (AWV)  09/30/2025   Meningococcal B Vaccine  Aged Out   Colonoscopy  Discontinued        Assessment/Plan:  This is a routine wellness  examination for [place].  Patient Care Team: Herold Hadassah SQUIBB, MD as PCP - General (Family Medicine) Aundria Ladell POUR, MD as Consulting Physician (Gastroenterology) Wonda Sharper, MD as Consulting Physician (Cardiology)  I have personally reviewed and noted the following in the patients chart:   Medical and social history Use of alcohol , tobacco or illicit drugs  Current medications and supplements including opioid prescriptions. Functional ability and status Nutritional status Physical activity Advanced directives List of other physicians Hospitalizations, surgeries, and ER visits in previous 12 months Vitals Screenings to include cognitive, depression, and falls Referrals and appointments  No orders of the defined types were placed in this encounter.  In addition, I have reviewed and discussed with patient certain preventive protocols, quality metrics, and best practice recommendations. A written personalized care plan for preventive services as well as general preventive health recommendations were provided to patient.   Vina Ned, CMA   09/30/2024   Return in 1 year (on 10/01/2025) for Medicare Annual Wellness Visit.  After Visit Summary: (Mail) Due to this being a telephonic visit, the after visit summary with patients personalized plan was offered to patient via mail   Nurse Notes:  Needs DM eye exam. Patient to call [facility] and schedule Needs DM foot exam at next OV on 10/03/24 Declined all vaccines Declined DM & Nutrition education referral "

## 2024-09-30 NOTE — Patient Instructions (Signed)
 Mr. Husby,  Thank you for taking the time for your Medicare Wellness Visit. I appreciate your continued commitment to your health goals. Please review the care plan we discussed, and feel free to reach out if I can assist you further.  Please note that Annual Wellness Visits do not include a physical exam. Some assessments may be limited, especially if the visit was conducted virtually. If needed, we may recommend an in-person follow-up with your provider.  Ongoing Care Seeing your primary care provider every 3 to 6 months helps us  monitor your health and provide consistent, personalized care.   Referrals If a referral was made during today's visit and you haven't received any updates within two weeks, please contact the referred provider directly to check on the status.  Recommended Screenings:  Call [facility] to schedule a diabetic eye exam at your earliest convenience. You should have this every year.    Health Maintenance  Topic Date Due   COVID-19 Vaccine (1) Never done   Complete foot exam   Never done   Hepatitis C Screening  Never done   Medicare Annual Wellness Visit  11/30/2020   Eye exam for diabetics  08/07/2024   Zoster (Shingles) Vaccine (1 of 2) 12/03/2024*   Flu Shot  12/23/2024*   DTaP/Tdap/Td vaccine (1 - Tdap) 09/04/2025*   Pneumococcal Vaccine for age over 63 (1 of 2 - PCV) 09/04/2025*   Hemoglobin A1C  03/05/2025   Yearly kidney function blood test for diabetes  09/04/2025   Yearly kidney health urinalysis for diabetes  09/04/2025   Meningitis B Vaccine  Aged Out   Colon Cancer Screening  Discontinued  *Topic was postponed. The date shown is not the original due date.       09/30/2024   11:51 AM  Advanced Directives  Does Patient Have a Medical Advance Directive? No  Would patient like information on creating a medical advance directive? No - Patient declined    Vision: Annual vision screenings are recommended for early detection of  glaucoma, cataracts, and diabetic retinopathy. These exams can also reveal signs of chronic conditions such as diabetes and high blood pressure.  Dental: Annual dental screenings help detect early signs of oral cancer, gum disease, and other conditions linked to overall health, including heart disease and diabetes.  Please see the attached documents for additional preventive care recommendations.

## 2024-10-03 ENCOUNTER — Encounter: Payer: Self-pay | Admitting: Family Medicine

## 2024-10-03 ENCOUNTER — Ambulatory Visit: Admitting: Family Medicine

## 2024-10-03 VITALS — BP 160/60 | HR 62 | Temp 97.7°F | Ht 65.0 in | Wt 162.4 lb

## 2024-10-03 DIAGNOSIS — E1122 Type 2 diabetes mellitus with diabetic chronic kidney disease: Secondary | ICD-10-CM | POA: Diagnosis not present

## 2024-10-03 DIAGNOSIS — N1831 Chronic kidney disease, stage 3a: Secondary | ICD-10-CM | POA: Diagnosis not present

## 2024-10-03 DIAGNOSIS — I152 Hypertension secondary to endocrine disorders: Secondary | ICD-10-CM | POA: Diagnosis not present

## 2024-10-03 DIAGNOSIS — E1159 Type 2 diabetes mellitus with other circulatory complications: Secondary | ICD-10-CM

## 2024-10-03 DIAGNOSIS — Z7985 Long-term (current) use of injectable non-insulin antidiabetic drugs: Secondary | ICD-10-CM

## 2024-10-03 MED ORDER — OZEMPIC (0.25 OR 0.5 MG/DOSE) 2 MG/3ML ~~LOC~~ SOPN: 0.2500 mg | PEN_INJECTOR | SUBCUTANEOUS | Status: AC

## 2024-10-03 NOTE — Assessment & Plan Note (Signed)
 BP running high. Currently just on 25mg  of hydrochlorothiazide  and carvedilol . Will continue current regimen as he's starting ozempic . Recheck 1 month. Call with any concerns.

## 2024-10-03 NOTE — Assessment & Plan Note (Signed)
 A1c 10.1 in December, down from 12.0- will start him on ozempic  with goal of getting off glipizide . Recheck 1 month. Call with any concerns.

## 2024-10-03 NOTE — Progress Notes (Signed)
 "  BP (!) 160/60   Pulse 62   Temp 97.7 F (36.5 C) (Oral)   Ht 5' 5 (1.651 m)   Wt 162 lb 6.4 oz (73.7 kg)   SpO2 98%   BMI 27.02 kg/m    Subjective:    Patient ID: Gilbert Obrien, male    DOB: June 16, 1946, 79 y.o.   MRN: [id number]  HPI: Gilbert Obrien is a 79 y.o. male  Chief Complaint  Patient presents with   Diabetes    No recent eye exam per patient   Hypertension   DIABETES- has been taking his glipizide , sugars still running high. He has been using the CGM Hypoglycemic episodes:no Polydipsia/polyuria: yes Visual disturbance: no Chest pain: no Paresthesias: no Glucose Monitoring: yes  Accucheck frequency: continuous Taking Insulin ?: no Blood Pressure Monitoring: not checking Retinal Examination: Not up to Date Foot Exam: Not up to Date Diabetic Education: Completed Pneumovax: Not up to Date Influenza: Not up to Date Aspirin : yes  HYPERTENSION  Hypertension status: uncontrolled  Satisfied with current treatment? yes Duration of hypertension: chronic BP monitoring frequency:  not checking BP medication side effects:  no Medication compliance: good compliance Previous BP meds: carvedilol , HCTZ Aspirin : yes Recurrent headaches: no Visual changes: no Palpitations: no Dyspnea: no Chest pain: no Lower extremity edema: no Dizzy/lightheaded: no  INSOMNIA Duration: chronic Satisfied with sleep quality: yes Difficulty falling asleep: no Difficulty staying asleep: no Waking a few hours after sleep onset: no Early morning awakenings: no Daytime hypersomnolence: no Wakes feeling refreshed: yes Good sleep hygiene: yes Apnea: no Snoring: no Depressed/anxious mood: no Recent stress: yes Restless legs/nocturnal leg cramps: no Chronic pain/arthritis: no History of sleep study: no Treatments attempted: temezepam    Relevant past medical, surgical, family and social history reviewed and updated as indicated. Interim medical history since our last  visit reviewed. Allergies and medications reviewed and updated.  Review of Systems  Constitutional: Negative.   Respiratory: Negative.    Cardiovascular: Negative.   Musculoskeletal: Negative.   Skin: Negative.   Psychiatric/Behavioral: Negative.      Per HPI unless specifically indicated above     Objective:    BP (!) 160/60   Pulse 62   Temp 97.7 F (36.5 C) (Oral)   Ht 5' 5 (1.651 m)   Wt 162 lb 6.4 oz (73.7 kg)   SpO2 98%   BMI 27.02 kg/m   Wt Readings from Last 3 Encounters:  10/03/24 162 lb 6.4 oz (73.7 kg)  09/30/24 165 lb (74.8 kg)  09/04/24 166 lb 3.2 oz (75.4 kg)    Physical Exam Vitals and nursing note reviewed.  Constitutional:      General: He is not in acute distress.    Appearance: Normal appearance. He is not ill-appearing, toxic-appearing or diaphoretic.  HENT:     Head: Normocephalic and atraumatic.     Right Ear: External ear normal.     Left Ear: External ear normal.     Nose: Nose normal.     Mouth/Throat:     Mouth: Mucous membranes are moist.     Pharynx: Oropharynx is clear.  Eyes:     General: No scleral icterus.       Right eye: No discharge.        Left eye: No discharge.     Extraocular Movements: Extraocular movements intact.     Conjunctiva/sclera: Conjunctivae normal.     Pupils: Pupils are equal, round, and reactive to light.  Cardiovascular:  Rate and Rhythm: Normal rate and regular rhythm.     Pulses: Normal pulses.     Heart sounds: Normal heart sounds. No murmur heard.    No friction rub. No gallop.  Pulmonary:     Effort: Pulmonary effort is normal. No respiratory distress.     Breath sounds: Normal breath sounds. No stridor. No wheezing, rhonchi or rales.  Chest:     Chest wall: No tenderness.  Musculoskeletal:        General: Normal range of motion.     Cervical back: Normal range of motion and neck supple.  Skin:    General: Skin is warm and dry.     Capillary Refill: Capillary refill takes less than 2  seconds.     Coloration: Skin is not jaundiced or pale.     Findings: No bruising, erythema, lesion or rash.  Neurological:     General: No focal deficit present.     Mental Status: He is alert and oriented to person, place, and time. Mental status is at baseline.  Psychiatric:        Mood and Affect: Mood normal.        Behavior: Behavior normal.        Thought Content: Thought content normal.        Judgment: Judgment normal.     Results for orders placed or performed in visit on 09/04/24  Microalbumin, Urine Waived (STAT)   Collection Time: 09/04/24  2:59 PM  Result Value Ref Range   Microalb, Ur Waived 80 (H) 0 - 19 mg/L   Creatinine, Urine Waived 50 10 - 300 mg/dL   Microalb/Creat Ratio 30-300 (H) <30 mg/g  Basic Metabolic Panel (BMET)   Collection Time: 09/04/24  3:00 PM  Result Value Ref Range   Glucose 369 (H) 70 - 99 mg/dL   BUN 32 (H) 8 - 27 mg/dL   Creatinine, Ser 8.25 (H) 0.76 - 1.27 mg/dL   eGFR 39 (L) >40 fO/fpw/8.26   BUN/Creatinine Ratio 18 10 - 24   Sodium 136 134 - 144 mmol/L   Potassium 4.2 3.5 - 5.2 mmol/L   Chloride 99 96 - 106 mmol/L   CO2 24 20 - 29 mmol/L   Calcium  9.2 8.6 - 10.2 mg/dL  HgB J8r   Collection Time: 09/04/24  3:00 PM  Result Value Ref Range   Hgb A1c MFr Bld 10.1 (H) 4.8 - 5.6 %   Est. average glucose Bld gHb Est-mCnc 243 mg/dL      Assessment & Plan:   Problem List Items Addressed This Visit       Cardiovascular and Mediastinum   Hypertension associated with diabetes (HCC)   BP running high. Currently just on 25mg  of hydrochlorothiazide  and carvedilol . Will continue current regimen as he's starting ozempic . Recheck 1 month. Call with any concerns.       Relevant Medications   Semaglutide ,0.25 or 0.5MG /DOS, (OZEMPIC , 0.25 OR 0.5 MG/DOSE,) 2 MG/3ML SOPN     Endocrine   Type II diabetes mellitus with renal manifestations (HCC) - Primary   A1c 10.1 in December, down from 12.0- will start him on ozempic  with goal of getting off  glipizide . Recheck 1 month. Call with any concerns.       Relevant Medications   Semaglutide ,0.25 or 0.5MG /DOS, (OZEMPIC , 0.25 OR 0.5 MG/DOSE,) 2 MG/3ML SOPN     Follow up plan: Return in about 4 weeks (around 10/31/2024).      "

## 2024-10-15 ENCOUNTER — Other Ambulatory Visit: Payer: Self-pay | Admitting: Family Medicine

## 2024-10-15 NOTE — Telephone Encounter (Unsigned)
 Copied from CRM #[id number]. Topic: Clinical - Medication Refill >> Oct 15, 2024  4:45 PM Travis F wrote: Medication: Continuous Glucose Sensor (FREESTYLE LIBRE 3 PLUS SENSOR) MISC [[id number]]  Has the patient contacted their pharmacy? Yes  (Agent: If yes, when and what did the pharmacy advise?) Contact office    This is the patient's preferred pharmacy:  [facility] [address] ([address]), [place] - [address] [place]) [place] [address] Phone: [phone] Fax: [phone]  Is this the correct pharmacy for this prescription? Yes If no, delete pharmacy and type the correct one.   Has the prescription been filled recently? Yes  Is the patient out of the medication? No, 2 days left   Has the patient been seen for an appointment in the last year OR does the patient have an upcoming appointment? Yes  Can we respond through MyChart? No  Agent: Please be advised that Rx refills may take up to 3 business days. We ask that you follow-up with your pharmacy.

## 2024-10-16 NOTE — Telephone Encounter (Signed)
 Requested medication (s) are due for refill today: yes  Requested medication (s) are on the active medication list: yes  Last refill:  09/30/24  Future visit scheduled: yes  Notes to clinic:  historical medication     Requested Prescriptions  Pending Prescriptions Disp Refills   Continuous Glucose Sensor (FREESTYLE LIBRE 3 PLUS SENSOR) MISC      Sig: by Does not apply route. Change sensor every 15 days.     There is no refill protocol information for this order

## 2024-10-17 MED ORDER — FREESTYLE LIBRE 3 PLUS SENSOR MISC: 12 refills | Status: AC

## 2024-10-27 ENCOUNTER — Other Ambulatory Visit: Payer: Self-pay | Admitting: Family Medicine

## 2024-10-29 NOTE — Telephone Encounter (Signed)
 This encounter was created in error - please disregard.

## 2024-10-29 NOTE — Telephone Encounter (Signed)
 Requested medication (s) are due for refill today: yes  Requested medication (s) are on the active medication list: yes  Last refill:  04/08/23  Future visit scheduled: {Yes  Notes to clinic:  Unable to refill per protocol, last refill by another provider.      Requested Prescriptions  Pending Prescriptions Disp Refills   atorvastatin  (LIPITOR ) 80 MG tablet 90 tablet 3    Sig: Take 1 tablet (80 mg total) by mouth at bedtime for cholesterol.     Cardiovascular:  Antilipid - Statins Failed - 10/29/2024 10:03 AM      Failed - Lipid Panel in normal range within the last 12 months    Cholesterol, Total  Date Value Ref Range Status  07/01/2024 131 100 - 199 mg/dL Final   LDL Chol Calc (NIH)  Date Value Ref Range Status  07/01/2024 59 0 - 99 mg/dL Final   HDL  Date Value Ref Range Status  07/01/2024 45 >39 mg/dL Final   Triglycerides  Date Value Ref Range Status  07/01/2024 158 (H) 0 - 149 mg/dL Final         Passed - Patient is not pregnant      Passed - Valid encounter within last 12 months    Recent Outpatient Visits           3 weeks ago Type 2 diabetes mellitus with stage 3a chronic kidney disease, without long-term current use of insulin  (HCC)   [facility], Megan P, DO   1 month ago Type 2 diabetes mellitus with diabetic microalbuminuria, without long-term current use of insulin  (HCC)   [facility] Herold Hadassah SQUIBB, MD   2 months ago Type 2 diabetes mellitus with diabetic microalbuminuria, without long-term current use of insulin  [facility])   [place] [facility] Herold Hadassah SQUIBB, MD   3 months ago Hypertension associated with diabetes [facility])   [place] [facility] Herold Hadassah SQUIBB, MD   3 months ago Type 2 diabetes mellitus with stage 3a chronic kidney disease, without long-term current use of insulin  [facility])   [place] [facility] Herold Hadassah SQUIBB, MD

## 2024-10-29 NOTE — Telephone Encounter (Signed)
 [facility] Refill Encounter  Last office visit: 10/03/2024  Verified last visit within 6 months and next visit is scheduled: Yes   Next office visit: 11/04/2024   Patient record reviewed by CMA and medication due for refill: Yes  Requested Prescriptions    No prescriptions requested or ordered in this encounter    This encounter was created in error - please disregard.

## 2024-10-30 NOTE — Telephone Encounter (Signed)
 Does he have enough to get him to his appointment next week?

## 2024-10-30 NOTE — Telephone Encounter (Signed)
 Patient returning call and states he does not have any atorvastatin . Requesting refill to be sent in as soon as possible.

## 2024-10-31 MED ORDER — ATORVASTATIN CALCIUM 80 MG PO TABS: 80.0000 mg | ORAL_TABLET | Freq: Every day | ORAL | 0 refills | Status: AC

## 2024-11-04 ENCOUNTER — Ambulatory Visit: Admitting: Family Medicine

## 2025-10-01 ENCOUNTER — Ambulatory Visit
